# Patient Record
Sex: Male | Born: 1960 | Race: Black or African American | Hispanic: No | Marital: Single | Smoking: Never smoker
Health system: Southern US, Community
[De-identification: ages and names within clinical notes are randomized; demographics above are authoritative.]

## PROBLEM LIST (undated history)

## (undated) DIAGNOSIS — E119 Type 2 diabetes mellitus without complications: Secondary | ICD-10-CM

## (undated) DIAGNOSIS — F419 Anxiety disorder, unspecified: Secondary | ICD-10-CM

## (undated) DIAGNOSIS — D649 Anemia, unspecified: Secondary | ICD-10-CM

## (undated) DIAGNOSIS — M545 Low back pain, unspecified: Secondary | ICD-10-CM

## (undated) DIAGNOSIS — IMO0001 Reserved for inherently not codable concepts without codable children: Secondary | ICD-10-CM

## (undated) DIAGNOSIS — I10 Essential (primary) hypertension: Secondary | ICD-10-CM

## (undated) DIAGNOSIS — M1990 Unspecified osteoarthritis, unspecified site: Secondary | ICD-10-CM

## (undated) DIAGNOSIS — R51 Headache: Secondary | ICD-10-CM

## (undated) DIAGNOSIS — R591 Generalized enlarged lymph nodes: Secondary | ICD-10-CM

## (undated) DIAGNOSIS — G8929 Other chronic pain: Secondary | ICD-10-CM

## (undated) DIAGNOSIS — F1120 Opioid dependence, uncomplicated: Secondary | ICD-10-CM

## (undated) DIAGNOSIS — L409 Psoriasis, unspecified: Secondary | ICD-10-CM

## (undated) DIAGNOSIS — K219 Gastro-esophageal reflux disease without esophagitis: Secondary | ICD-10-CM

## (undated) DIAGNOSIS — F40240 Claustrophobia: Secondary | ICD-10-CM

## (undated) DIAGNOSIS — R058 Other specified cough: Secondary | ICD-10-CM

## (undated) DIAGNOSIS — G5602 Carpal tunnel syndrome, left upper limb: Secondary | ICD-10-CM

## (undated) DIAGNOSIS — R05 Cough: Secondary | ICD-10-CM

## (undated) DIAGNOSIS — L4050 Arthropathic psoriasis, unspecified: Secondary | ICD-10-CM

## (undated) DIAGNOSIS — R519 Headache, unspecified: Secondary | ICD-10-CM

## (undated) HISTORY — DX: Generalized enlarged lymph nodes: R59.1

## (undated) HISTORY — PX: CHOLECYSTECTOMY: SHX55

## (undated) HISTORY — PX: SHOULDER SURGERY: SHX246

## (undated) HISTORY — PX: JOINT REPLACEMENT: SHX530

---

## 1987-02-14 HISTORY — PX: LEG SURGERY: SHX1003

## 2011-02-14 HISTORY — PX: NOSE SURGERY: SHX723

## 2011-02-14 HISTORY — PX: DIAGNOSTIC LAPAROSCOPY: SUR761

## 2012-08-13 ENCOUNTER — Other Ambulatory Visit: Payer: Self-pay | Admitting: Orthopedic Surgery

## 2012-08-13 DIAGNOSIS — M79605 Pain in left leg: Secondary | ICD-10-CM

## 2012-08-19 ENCOUNTER — Ambulatory Visit
Admission: RE | Admit: 2012-08-19 | Discharge: 2012-08-19 | Disposition: A | Discharge status: Home / Self Care | Payer: Medicare Other | Source: Ambulatory Visit | Attending: Orthopedic Surgery | Admitting: Orthopedic Surgery

## 2012-08-19 DIAGNOSIS — M79605 Pain in left leg: Secondary | ICD-10-CM

## 2012-08-28 ENCOUNTER — Other Ambulatory Visit: Payer: Self-pay | Admitting: Orthopedic Surgery

## 2012-09-16 ENCOUNTER — Encounter (HOSPITAL_COMMUNITY): Payer: Self-pay

## 2012-09-20 NOTE — Pre-Procedure Instructions (Addendum)
Zedrick Springsteen  09/20/2012   Your procedure is scheduled on:  10/01/12  Report to [facility] at 530 AM.  Call this number if you have problems the morning of surgery: [phone]   Remember:   Do not eat food or drink liquids after midnight.   Take these medicines the morning of surgery with A SIP OF WATER: allopurinol, prilosec, zoloft   Do not wear jewelry, make-up or nail polish.  Do not wear lotions, powders, or perfumes. You may wear deodorant.  Do not shave 48 hours prior to surgery. Men may shave face and neck.  Do not bring valuables to the hospital.  [facility] is not responsible                   for any belongings or valuables.  Contacts, dentures or bridgework may not be worn into surgery.  Leave suitcase in the car. After surgery it may be brought to your room.  For patients admitted to the hospital, checkout time is 11:00 AM the day of  discharge.   Patients discharged the day of surgery will not be allowed to drive  home.  Name and phone number of your driver:  Special Instructions: Shower using CHG 2 nights before surgery and the night before surgery.  If you shower the day of surgery use CHG.  Use special wash - you have one bottle of CHG for all showers.  You should use approximately 1/3 of the bottle for each shower.   Please read over the following fact sheets that you were given: Pain Booklet, Coughing and Deep Breathing, Blood Transfusion Information, Total Joint Packet, MRSA Information and Surgical Site Infection Prevention

## 2012-09-23 ENCOUNTER — Encounter (HOSPITAL_COMMUNITY)
Admission: RE | Admit: 2012-09-23 | Discharge: 2012-09-23 | Disposition: A | Discharge status: Home / Self Care | Payer: Medicare Other | Source: Ambulatory Visit | Attending: Orthopedic Surgery | Admitting: Orthopedic Surgery

## 2012-09-23 ENCOUNTER — Encounter (HOSPITAL_COMMUNITY): Payer: Self-pay

## 2012-09-23 DIAGNOSIS — Z01810 Encounter for preprocedural cardiovascular examination: Secondary | ICD-10-CM | POA: Insufficient documentation

## 2012-09-23 DIAGNOSIS — Z01818 Encounter for other preprocedural examination: Secondary | ICD-10-CM | POA: Insufficient documentation

## 2012-09-23 DIAGNOSIS — Z01812 Encounter for preprocedural laboratory examination: Secondary | ICD-10-CM | POA: Insufficient documentation

## 2012-09-23 HISTORY — DX: Essential (primary) hypertension: I10

## 2012-09-23 HISTORY — DX: Unspecified osteoarthritis, unspecified site: M19.90

## 2012-09-23 HISTORY — DX: Gastro-esophageal reflux disease without esophagitis: K21.9

## 2012-09-23 HISTORY — DX: Anxiety disorder, unspecified: F41.9

## 2012-09-23 HISTORY — DX: Type 2 diabetes mellitus without complications: E11.9

## 2012-09-23 LAB — CBC
Hemoglobin: 15.1 g/dL (ref 13.0–17.0)
Platelets: 213 10*3/uL (ref 150–400)
RBC: 5.38 MIL/uL (ref 4.22–5.81)

## 2012-09-23 LAB — BASIC METABOLIC PANEL
GFR calc Af Amer: >90 mL/min (ref >90)
GFR calc non Af Amer: >90 mL/min (ref >90)
Potassium: 4.4 mEq/L (ref 3.5–5.1)
Sodium: 141 mEq/L (ref 135–145)

## 2012-09-23 LAB — URINALYSIS, ROUTINE W REFLEX MICROSCOPIC
Hgb urine dipstick: NEGATIVE
Ketones, ur: NEGATIVE mg/dL
Protein, ur: NEGATIVE mg/dL
Urobilinogen, UA: 1 mg/dL (ref 0.0–1.0)

## 2012-09-23 LAB — SURGICAL PCR SCREEN
MRSA, PCR: NEGATIVE
Staphylococcus aureus: NEGATIVE

## 2012-09-23 LAB — TYPE AND SCREEN
ABO/RH(D): B POS
Antibody Screen: NEGATIVE

## 2012-09-23 LAB — ABO/RH: ABO/RH(D): B POS

## 2012-09-23 NOTE — Progress Notes (Signed)
09/23/12 0830  OBSTRUCTIVE SLEEP APNEA  Have you ever been diagnosed with sleep apnea through a sleep study? No  Do you snore loudly (loud enough to be heard through closed doors)?  0  Do you often feel tired, fatigued, or sleepy during the daytime? 1  Has anyone observed you stop breathing during your sleep? 0  Do you have, or are you being treated for high blood pressure? 1  BMI more than 35 kg/m2? 1  Age over 54 years old? 1  Neck circumference greater than 40 cm/18 inches? 1 (20)  Gender: 1  Obstructive Sleep Apnea Score 6  Score 4 or greater  Results sent to PCP

## 2012-09-23 NOTE — Progress Notes (Signed)
req'd notes, ekg from pcp dr Velna Hatchet at [place]

## 2012-09-24 LAB — URINE CULTURE

## 2012-09-24 NOTE — Progress Notes (Addendum)
Anesthesia chart review:  Patient is a 52 year old male scheduled for right TKA on 10/01/12 by Phillip Rocha.  History includes morbid obesity (BMI 47), DM2, HTN (BP 138/100 at PAT with losartan added on 09/18/12), GERD, anxiety, osteoarthritis, psoriatic arthritis with history of Enbrel (per PCP notes), cholecystectomy, non-smoker.  OSA screening score was 6.  PCP is Phillip Rocha at [facility]) who saw patient on 09/18/12 and following visit with EKG felt patient to be acceptable medical risk for this procedure. Rheumatologist is Phillip Rocha. He recently moved from [place] to [facility].  EKG on 09/23/12 showed SR with occasional PACs, inferior infarct (age undetermined).  His EKG appears similar to the 09/18/12 surgical clearance EKG done at Phillip Rocha office.   CXR on 09/23/12 showed: Bibasilar scarring versus fibrosis. No superimposed acute process.  Preoperative labs noted. By PCP notes, his last A1C was 7.1 and metformin was increased to 1000MG BID on 09/18/12.  I reviewed above with anesthesiologist Phillip Rocha.  Although patient has no documented history of CAD, he has inferior Q waves on EKG and multiple CAD risk factors.  Preoperative cardiology evaluation recommended.  I have relayed this to Dr. Randel Rocha Phillip Rocha.  Phillip Rocha [facility] Short Stay Center/Anesthesiology Phone [phone] 09/24/2012 4:12 PM  Addendum: 09/30/13 11:11 AM Patient had a cardiology evaluation by Phillip Rocha [facility]) on 09/25/12.  EKG was reviewed.  BP was 144/96.  A nuclear stress test was ordered and done on 09/26/12.  Results showed inferior defect consistent with diaphragmatic attenuation, small fixed mild anterior defect likely soft tissue attenuation, no ischemia, post stress EF 50%.  Low risk scan.  Subsequently, patient was cleared for knee surgery.

## 2012-09-30 MED ORDER — CHLORHEXIDINE GLUCONATE 4 % EX LIQD: 60.0000 mL | Freq: Once | CUTANEOUS | Status: DC

## 2012-09-30 MED ORDER — CEFAZOLIN SODIUM-DEXTROSE 2-3 GM-% IV SOLR: 2.0000 g | INTRAVENOUS | Status: DC

## 2012-09-30 MED ORDER — DEXTROSE 5 % IV SOLN
3.0000 g | INTRAVENOUS | Status: AC
  Administered 2012-10-01: 3 g via INTRAVENOUS
  Administered 2012-10-01: 2 g via INTRAVENOUS
  Filled 2012-09-30: qty 3000
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
## 2012-10-01 ENCOUNTER — Inpatient Hospital Stay (HOSPITAL_COMMUNITY): Payer: Medicare Other | Admitting: Anesthesiology

## 2012-10-01 ENCOUNTER — Encounter (HOSPITAL_COMMUNITY)
Admission: RE | Disposition: A | Discharge status: Skilled Nursing Facility | Payer: Self-pay | Source: Ambulatory Visit | Attending: Orthopedic Surgery

## 2012-10-01 ENCOUNTER — Encounter (HOSPITAL_COMMUNITY): Payer: Self-pay | Admitting: Surgery

## 2012-10-01 ENCOUNTER — Encounter (HOSPITAL_COMMUNITY): Payer: Self-pay | Admitting: Vascular Surgery

## 2012-10-01 ENCOUNTER — Inpatient Hospital Stay (HOSPITAL_COMMUNITY)
Admission: RE | Admit: 2012-10-01 | Discharge: 2012-10-04 | DRG: 470 | Disposition: A | Discharge status: Skilled Nursing Facility | Payer: Medicare Other | Source: Ambulatory Visit | Attending: Orthopedic Surgery | Admitting: Orthopedic Surgery

## 2012-10-01 DIAGNOSIS — K219 Gastro-esophageal reflux disease without esophagitis: Secondary | ICD-10-CM | POA: Diagnosis present

## 2012-10-01 DIAGNOSIS — Z23 Encounter for immunization: Secondary | ICD-10-CM

## 2012-10-01 DIAGNOSIS — Z9089 Acquired absence of other organs: Secondary | ICD-10-CM

## 2012-10-01 DIAGNOSIS — F411 Generalized anxiety disorder: Secondary | ICD-10-CM | POA: Diagnosis present

## 2012-10-01 DIAGNOSIS — Z79899 Other long term (current) drug therapy: Secondary | ICD-10-CM

## 2012-10-01 DIAGNOSIS — I10 Essential (primary) hypertension: Secondary | ICD-10-CM | POA: Diagnosis present

## 2012-10-01 DIAGNOSIS — M1710 Unilateral primary osteoarthritis, unspecified knee: Principal | ICD-10-CM | POA: Diagnosis present

## 2012-10-01 DIAGNOSIS — M1711 Unilateral primary osteoarthritis, right knee: Secondary | ICD-10-CM

## 2012-10-01 DIAGNOSIS — E119 Type 2 diabetes mellitus without complications: Secondary | ICD-10-CM | POA: Diagnosis present

## 2012-10-01 HISTORY — PX: TOTAL KNEE ARTHROPLASTY: SHX125

## 2012-10-01 LAB — GLUCOSE, CAPILLARY: Glucose-Capillary: 144 mg/dL — ABNORMAL HIGH (ref 70–99)

## 2012-10-01 LAB — PROTIME-INR
INR: 0.86 (ref 0.00–1.49)
Prothrombin Time: 11.6 seconds (ref 11.6–15.2)

## 2012-10-01 SURGERY — ARTHROPLASTY, KNEE, TOTAL
Anesthesia: General | Site: Knee | Laterality: Right | Wound class: Clean

## 2012-10-01 MED ORDER — ACETAMINOPHEN 650 MG RE SUPP: 650.0000 mg | Freq: Four times a day (QID) | RECTAL | Status: DC | PRN

## 2012-10-01 MED ORDER — 0.9 % SODIUM CHLORIDE (POUR BTL) OPTIME
TOPICAL | Status: DC | PRN
  Administered 2012-10-01: 1000 mL

## 2012-10-01 MED ORDER — INSULIN ASPART 100 UNIT/ML ~~LOC~~ SOLN
0.0000 [IU] | Freq: Three times a day (TID) | SUBCUTANEOUS | Status: DC
  Administered 2012-10-01 – 2012-10-02 (×4): 2 [IU] via SUBCUTANEOUS
  Administered 2012-10-03: 3 [IU] via SUBCUTANEOUS
  Administered 2012-10-03 – 2012-10-04 (×4): 2 [IU] via SUBCUTANEOUS

## 2012-10-01 MED ORDER — POTASSIUM CHLORIDE IN NACL 20-0.9 MEQ/L-% IV SOLN
INTRAVENOUS | Status: DC
  Administered 2012-10-02: 02:00:00 via INTRAVENOUS
  Filled 2012-10-01 (×2): qty 1000

## 2012-10-01 MED ORDER — WARFARIN VIDEO
Freq: Once | Status: AC
  Administered 2012-10-02: 10:00:00

## 2012-10-01 MED ORDER — NEOSTIGMINE METHYLSULFATE 1 MG/ML IJ SOLN
INTRAMUSCULAR | Status: DC | PRN
  Administered 2012-10-01: 4 mg via INTRAVENOUS

## 2012-10-01 MED ORDER — CYCLOBENZAPRINE HCL 10 MG PO TABS
10.0000 mg | ORAL_TABLET | Freq: Every day | ORAL | Status: DC
  Administered 2012-10-01 – 2012-10-04 (×4): 10 mg via ORAL
  Filled 2012-10-01 (×6): qty 1

## 2012-10-01 MED ORDER — OXYCODONE HCL ER 15 MG PO T12A
15.0000 mg | EXTENDED_RELEASE_TABLET | Freq: Two times a day (BID) | ORAL | Status: DC
  Administered 2012-10-02 – 2012-10-04 (×5): 15 mg via ORAL
  Filled 2012-10-01 (×5): qty 1

## 2012-10-01 MED ORDER — COUMADIN BOOK
Freq: Once | Status: AC
  Administered 2012-10-01: 16:00:00
  Filled 2012-10-01: qty 1

## 2012-10-01 MED ORDER — MIDAZOLAM HCL 2 MG/2ML IJ SOLN: 0.5000 mg | Freq: Once | INTRAMUSCULAR | Status: DC | PRN

## 2012-10-01 MED ORDER — AMLODIPINE BESYLATE 10 MG PO TABS
10.0000 mg | ORAL_TABLET | Freq: Every day | ORAL | Status: DC
  Administered 2012-10-01 – 2012-10-04 (×4): 10 mg via ORAL
  Filled 2012-10-01 (×4): qty 1

## 2012-10-01 MED ORDER — PHENOL 1.4 % MT LIQD: 1.0000 | OROMUCOSAL | Status: DC | PRN

## 2012-10-01 MED ORDER — ONDANSETRON HCL 4 MG/2ML IJ SOLN: 4.0000 mg | Freq: Four times a day (QID) | INTRAMUSCULAR | Status: DC | PRN

## 2012-10-01 MED ORDER — MORPHINE SULFATE (PF) 1 MG/ML IV SOLN
INTRAVENOUS | Status: DC
  Administered 2012-10-01: 13:00:00 via INTRAVENOUS
  Administered 2012-10-01: 4.5 mg via INTRAVENOUS
  Administered 2012-10-02: 13 mg via INTRAVENOUS
  Filled 2012-10-01: qty 25

## 2012-10-01 MED ORDER — DIPHENHYDRAMINE HCL 50 MG/ML IJ SOLN
12.5000 mg | Freq: Four times a day (QID) | INTRAMUSCULAR | Status: DC | PRN
  Administered 2012-10-01 – 2012-10-03 (×4): 12.5 mg via INTRAVENOUS
  Filled 2012-10-01 (×4): qty 1

## 2012-10-01 MED ORDER — METFORMIN HCL 500 MG PO TABS
1000.0000 mg | ORAL_TABLET | Freq: Two times a day (BID) | ORAL | Status: DC
  Administered 2012-10-01 – 2012-10-04 (×7): 1000 mg via ORAL
  Filled 2012-10-01 (×8): qty 2

## 2012-10-01 MED ORDER — WARFARIN SODIUM 10 MG PO TABS
10.0000 mg | ORAL_TABLET | Freq: Once | ORAL | Status: AC
  Administered 2012-10-01: 10 mg via ORAL
  Filled 2012-10-01: qty 1

## 2012-10-01 MED ORDER — HYDROMORPHONE HCL PF 1 MG/ML IJ SOLN
0.2500 mg | INTRAMUSCULAR | Status: DC | PRN
  Administered 2012-10-01 (×4): 0.5 mg via INTRAVENOUS

## 2012-10-01 MED ORDER — OXYCODONE HCL 5 MG PO TABS
5.0000 mg | ORAL_TABLET | Freq: Once | ORAL | Status: AC | PRN
  Administered 2012-10-01: 5 mg via ORAL

## 2012-10-01 MED ORDER — PROMETHAZINE HCL 25 MG/ML IJ SOLN: 6.2500 mg | INTRAMUSCULAR | Status: DC | PRN

## 2012-10-01 MED ORDER — MIDAZOLAM HCL 5 MG/5ML IJ SOLN
INTRAMUSCULAR | Status: DC | PRN
  Administered 2012-10-01: 2 mg via INTRAVENOUS

## 2012-10-01 MED ORDER — ROCURONIUM BROMIDE 100 MG/10ML IV SOLN
INTRAVENOUS | Status: DC | PRN
  Administered 2012-10-01: 25 mg via INTRAVENOUS
  Administered 2012-10-01: 10 mg via INTRAVENOUS
  Administered 2012-10-01: 20 mg via INTRAVENOUS
  Administered 2012-10-01: 25 mg via INTRAVENOUS

## 2012-10-01 MED ORDER — LOSARTAN POTASSIUM 50 MG PO TABS
50.0000 mg | ORAL_TABLET | Freq: Every day | ORAL | Status: DC
  Administered 2012-10-01 – 2012-10-04 (×4): 50 mg via ORAL
  Filled 2012-10-01 (×4): qty 1

## 2012-10-01 MED ORDER — ALLOPURINOL 100 MG PO TABS
100.0000 mg | ORAL_TABLET | Freq: Two times a day (BID) | ORAL | Status: DC
  Administered 2012-10-01 – 2012-10-04 (×6): 100 mg via ORAL
  Filled 2012-10-01 (×7): qty 1

## 2012-10-01 MED ORDER — SERTRALINE HCL 25 MG PO TABS
25.0000 mg | ORAL_TABLET | Freq: Every day | ORAL | Status: DC
  Administered 2012-10-02 – 2012-10-04 (×3): 25 mg via ORAL
  Filled 2012-10-01 (×3): qty 1

## 2012-10-01 MED ORDER — METOCLOPRAMIDE HCL 10 MG PO TABS
5.0000 mg | ORAL_TABLET | Freq: Three times a day (TID) | ORAL | Status: DC | PRN
  Administered 2012-10-04: 10 mg via ORAL
  Filled 2012-10-01: qty 1

## 2012-10-01 MED ORDER — OXYCODONE HCL 5 MG PO TABS
ORAL_TABLET | ORAL | Status: AC
  Administered 2012-10-01: 5 mg
  Filled 2012-10-01: qty 1

## 2012-10-01 MED ORDER — DIPHENHYDRAMINE HCL 12.5 MG/5ML PO ELIX: 12.5000 mg | ORAL_SOLUTION | Freq: Four times a day (QID) | ORAL | Status: DC | PRN

## 2012-10-01 MED ORDER — SODIUM CHLORIDE 0.9 % IJ SOLN
INTRAMUSCULAR | Status: DC | PRN
  Administered 2012-10-01: 60 mL

## 2012-10-01 MED ORDER — PANTOPRAZOLE SODIUM 40 MG PO TBEC
40.0000 mg | DELAYED_RELEASE_TABLET | Freq: Every day | ORAL | Status: DC
  Administered 2012-10-02 – 2012-10-04 (×3): 40 mg via ORAL
  Filled 2012-10-01 (×3): qty 1

## 2012-10-01 MED ORDER — GLYCOPYRROLATE 0.2 MG/ML IJ SOLN
INTRAMUSCULAR | Status: DC | PRN
  Administered 2012-10-01: .8 mg via INTRAVENOUS

## 2012-10-01 MED ORDER — SUCCINYLCHOLINE CHLORIDE 20 MG/ML IJ SOLN
INTRAMUSCULAR | Status: DC | PRN
  Administered 2012-10-01: 120 mg via INTRAVENOUS

## 2012-10-01 MED ORDER — MORPHINE SULFATE (PF) 1 MG/ML IV SOLN
INTRAVENOUS | Status: AC
  Filled 2012-10-01: qty 25

## 2012-10-01 MED ORDER — ATORVASTATIN CALCIUM 40 MG PO TABS
40.0000 mg | ORAL_TABLET | Freq: Every day | ORAL | Status: DC
  Administered 2012-10-01 – 2012-10-04 (×4): 40 mg via ORAL
  Filled 2012-10-01 (×4): qty 1

## 2012-10-01 MED ORDER — SODIUM CHLORIDE 0.9 % IJ SOLN
INTRAMUSCULAR | Status: DC | PRN
  Administered 2012-10-01: 10:00:00

## 2012-10-01 MED ORDER — PHENYLEPHRINE HCL 10 MG/ML IJ SOLN
10.0000 mg | INTRAVENOUS | Status: DC | PRN
  Administered 2012-10-01: 10 ug/min via INTRAVENOUS

## 2012-10-01 MED ORDER — LACTATED RINGERS IV SOLN
INTRAVENOUS | Status: DC | PRN
  Administered 2012-10-01 (×3): via INTRAVENOUS

## 2012-10-01 MED ORDER — DOCUSATE SODIUM 100 MG PO CAPS
100.0000 mg | ORAL_CAPSULE | Freq: Two times a day (BID) | ORAL | Status: DC
  Administered 2012-10-01 – 2012-10-04 (×6): 100 mg via ORAL
  Filled 2012-10-01 (×7): qty 1

## 2012-10-01 MED ORDER — ONDANSETRON HCL 4 MG PO TABS: 4.0000 mg | ORAL_TABLET | Freq: Four times a day (QID) | ORAL | Status: DC | PRN

## 2012-10-01 MED ORDER — HYDROMORPHONE HCL PF 1 MG/ML IJ SOLN
INTRAMUSCULAR | Status: AC
  Filled 2012-10-01: qty 2

## 2012-10-01 MED ORDER — NALOXONE HCL 0.4 MG/ML IJ SOLN: 0.4000 mg | INTRAMUSCULAR | Status: DC | PRN

## 2012-10-01 MED ORDER — MEPERIDINE HCL 25 MG/ML IJ SOLN: 6.2500 mg | INTRAMUSCULAR | Status: DC | PRN

## 2012-10-01 MED ORDER — ACETAMINOPHEN 325 MG PO TABS
650.0000 mg | ORAL_TABLET | Freq: Four times a day (QID) | ORAL | Status: DC | PRN
  Administered 2012-10-03 – 2012-10-04 (×2): 650 mg via ORAL
  Filled 2012-10-01 (×2): qty 2

## 2012-10-01 MED ORDER — SODIUM CHLORIDE 0.9 % IR SOLN
Status: DC | PRN
  Administered 2012-10-01: 3000 mL

## 2012-10-01 MED ORDER — WARFARIN - PHARMACIST DOSING INPATIENT
Freq: Every day | Status: DC
  Administered 2012-10-04: 18:00:00

## 2012-10-01 MED ORDER — PROPOFOL 10 MG/ML IV BOLUS
INTRAVENOUS | Status: DC | PRN
  Administered 2012-10-01: 300 mg via INTRAVENOUS

## 2012-10-01 MED ORDER — SODIUM CHLORIDE 0.9 % IJ SOLN: 9.0000 mL | INTRAMUSCULAR | Status: DC | PRN

## 2012-10-01 MED ORDER — BUPIVACAINE LIPOSOME 1.3 % IJ SUSP
20.0000 mL | INTRAMUSCULAR | Status: DC
  Filled 2012-10-01: qty 20

## 2012-10-01 MED ORDER — MENTHOL 3 MG MT LOZG: 1.0000 | LOZENGE | OROMUCOSAL | Status: DC | PRN

## 2012-10-01 MED ORDER — CEFAZOLIN SODIUM-DEXTROSE 2-3 GM-% IV SOLR
INTRAVENOUS | Status: AC
  Filled 2012-10-01: qty 50

## 2012-10-01 MED ORDER — OXYCODONE HCL 5 MG/5ML PO SOLN: 5.0000 mg | Freq: Once | ORAL | Status: AC | PRN

## 2012-10-01 MED ORDER — PNEUMOCOCCAL VAC POLYVALENT 25 MCG/0.5ML IJ INJ
0.5000 mL | INJECTION | INTRAMUSCULAR | Status: AC
  Filled 2012-10-01: qty 0.5

## 2012-10-01 MED ORDER — OXYCODONE HCL 5 MG PO TABS
5.0000 mg | ORAL_TABLET | ORAL | Status: DC | PRN
  Administered 2012-10-01 – 2012-10-04 (×16): 10 mg via ORAL
  Filled 2012-10-01 (×18): qty 2

## 2012-10-01 MED ORDER — FENTANYL CITRATE 0.05 MG/ML IJ SOLN
INTRAMUSCULAR | Status: DC | PRN
  Administered 2012-10-01 (×3): 50 ug via INTRAVENOUS
  Administered 2012-10-01 (×2): 100 ug via INTRAVENOUS
  Administered 2012-10-01: 150 ug via INTRAVENOUS

## 2012-10-01 MED ORDER — CEFAZOLIN SODIUM-DEXTROSE 2-3 GM-% IV SOLR
2.0000 g | Freq: Four times a day (QID) | INTRAVENOUS | Status: AC
  Administered 2012-10-01 (×2): 2 g via INTRAVENOUS
  Filled 2012-10-01 (×2): qty 50

## 2012-10-01 MED ORDER — METOCLOPRAMIDE HCL 5 MG/ML IJ SOLN: 5.0000 mg | Freq: Three times a day (TID) | INTRAMUSCULAR | Status: DC | PRN

## 2012-10-01 MED ORDER — PHENYLEPHRINE HCL 10 MG/ML IJ SOLN
INTRAMUSCULAR | Status: DC | PRN
  Administered 2012-10-01: 80 ug via INTRAVENOUS

## 2012-10-01 SURGICAL SUPPLY — 81 items
BAG URINE DRAINAGE (UROLOGICAL SUPPLIES) ×2 IMPLANT
BANDAGE ELASTIC 4 VELCRO ST LF (GAUZE/BANDAGES/DRESSINGS) ×2 IMPLANT
BANDAGE ELASTIC 6 VELCRO ST LF (GAUZE/BANDAGES/DRESSINGS) ×2 IMPLANT
BANDAGE ESMARK 6X9 LF (GAUZE/BANDAGES/DRESSINGS) ×1 IMPLANT
BLADE SAG 18X100X1.27 (BLADE) ×2 IMPLANT
BLADE SAW SGTL 13.0X1.19X90.0M (BLADE) ×2 IMPLANT
BLADE SURG 10 STRL SS (BLADE) ×2 IMPLANT
BNDG COHESIVE 6X5 TAN STRL LF (GAUZE/BANDAGES/DRESSINGS) ×4 IMPLANT
BNDG ELASTIC 6X10 VLCR STRL LF (GAUZE/BANDAGES/DRESSINGS) ×6 IMPLANT
BNDG ESMARK 6X9 LF (GAUZE/BANDAGES/DRESSINGS) ×2
BOWL SMART MIX CTS (DISPOSABLE) ×4 IMPLANT
CATH COUDE FOLEY 2W 5CC 18FR (CATHETERS) ×2 IMPLANT
CATH COUDE FOLEY 5CC 14FR (CATHETERS) ×2 IMPLANT
CATH FOLEY 2WAY SLVR  5CC 14FR (CATHETERS) ×1
CATH FOLEY 2WAY SLVR 5CC 14FR (CATHETERS) ×1 IMPLANT
CEMENT BONE SIMPLEX SPEEDSET (Cement) ×6 IMPLANT
CLOTH BEACON ORANGE TIMEOUT ST (SAFETY) ×2 IMPLANT
COVER SURGICAL LIGHT HANDLE (MISCELLANEOUS) ×2 IMPLANT
CUFF TOURNIQUET SINGLE 34IN LL (TOURNIQUET CUFF) IMPLANT
CUFF TOURNIQUET SINGLE 44IN (TOURNIQUET CUFF) ×2 IMPLANT
DRAPE INCISE IOBAN 66X45 STRL (DRAPES) ×2 IMPLANT
DRAPE ORTHO SPLIT 77X108 STRL (DRAPES) ×3
DRAPE SURG ORHT 6 SPLT 77X108 (DRAPES) ×3 IMPLANT
DRAPE U-SHAPE 47X51 STRL (DRAPES) ×2 IMPLANT
DRAPE X-RAY CASS 24X20 (DRAPES) IMPLANT
DRSG PAD ABDOMINAL 8X10 ST (GAUZE/BANDAGES/DRESSINGS) ×2 IMPLANT
DURAPREP 26ML APPLICATOR (WOUND CARE) ×4 IMPLANT
ELECT REM PT RETURN 9FT ADLT (ELECTROSURGICAL) ×2
ELECTRODE REM PT RTRN 9FT ADLT (ELECTROSURGICAL) ×1 IMPLANT
EVACUATOR 1/8 PVC DRAIN (DRAIN) IMPLANT
FACESHIELD LNG OPTICON STERILE (SAFETY) ×2 IMPLANT
GAUZE XEROFORM 5X9 LF (GAUZE/BANDAGES/DRESSINGS) ×2 IMPLANT
GLOVE BIOGEL PI IND STRL 7.5 (GLOVE) ×1 IMPLANT
GLOVE BIOGEL PI IND STRL 8 (GLOVE) ×1 IMPLANT
GLOVE BIOGEL PI INDICATOR 7.5 (GLOVE) ×1
GLOVE BIOGEL PI INDICATOR 8 (GLOVE) ×1
GLOVE ECLIPSE 7.0 STRL STRAW (GLOVE) ×4 IMPLANT
GLOVE SURG ORTHO 8.0 STRL STRW (GLOVE) ×4 IMPLANT
GOWN PREVENTION PLUS LG XLONG (DISPOSABLE) IMPLANT
GOWN PREVENTION PLUS XLARGE (GOWN DISPOSABLE) ×2 IMPLANT
GOWN STRL NON-REIN LRG LVL3 (GOWN DISPOSABLE) ×6 IMPLANT
HANDPIECE INTERPULSE COAX TIP (DISPOSABLE) ×1
HOOD PEEL AWAY FACE SHEILD DIS (HOOD) ×6 IMPLANT
IMMOBILIZER KNEE 20 (SOFTGOODS)
IMMOBILIZER KNEE 20 THIGH 36 (SOFTGOODS) IMPLANT
IMMOBILIZER KNEE 22 UNIV (SOFTGOODS) ×2 IMPLANT
IMMOBILIZER KNEE 24 THIGH 36 (MISCELLANEOUS) IMPLANT
IMMOBILIZER KNEE 24 UNIV (MISCELLANEOUS)
KIT BASIN OR (CUSTOM PROCEDURE TRAY) ×2 IMPLANT
KIT ROOM TURNOVER OR (KITS) ×2 IMPLANT
KNEE LEVEL 1C ×2 IMPLANT
MANIFOLD NEPTUNE II (INSTRUMENTS) ×2 IMPLANT
MARKER SPHERE PSV REFLC THRD 5 (MARKER) IMPLANT
NEEDLE 18GX1X1/2 (RX/OR ONLY) (NEEDLE) IMPLANT
NEEDLE SPNL 18GX3.5 QUINCKE PK (NEEDLE) ×2 IMPLANT
NS IRRIG 1000ML POUR BTL (IV SOLUTION) ×6 IMPLANT
PACK TOTAL JOINT (CUSTOM PROCEDURE TRAY) ×2 IMPLANT
PAD ARMBOARD 7.5X6 YLW CONV (MISCELLANEOUS) ×4 IMPLANT
PAD CAST 4YDX4 CTTN HI CHSV (CAST SUPPLIES) ×1 IMPLANT
PADDING CAST COTTON 4X4 STRL (CAST SUPPLIES) ×1
PADDING CAST COTTON 6X4 STRL (CAST SUPPLIES) ×2 IMPLANT
PIN SCHANZ 4MM 130MM (PIN) IMPLANT
RUBBERBAND STERILE (MISCELLANEOUS) ×2 IMPLANT
SET HNDPC FAN SPRY TIP SCT (DISPOSABLE) ×1 IMPLANT
SPONGE GAUZE 4X4 12PLY (GAUZE/BANDAGES/DRESSINGS) ×2 IMPLANT
SPONGE LAP 18X18 X RAY DECT (DISPOSABLE) IMPLANT
STAPLER VISISTAT 35W (STAPLE) ×2 IMPLANT
STEM CEMENTED (Stem) ×2 IMPLANT
SUCTION FRAZIER TIP 10 FR DISP (SUCTIONS) ×2 IMPLANT
SUT ETHILON 3 0 PS 1 (SUTURE) ×4 IMPLANT
SUT VIC AB 0 CTB1 27 (SUTURE) ×8 IMPLANT
SUT VIC AB 1 CT1 27 (SUTURE) ×4
SUT VIC AB 1 CT1 27XBRD ANBCTR (SUTURE) ×4 IMPLANT
SUT VIC AB 2-0 CT1 27 (SUTURE) ×2
SUT VIC AB 2-0 CT1 TAPERPNT 27 (SUTURE) ×2 IMPLANT
SYR 30ML SLIP (SYRINGE) ×2 IMPLANT
SYR TB 1ML LUER SLIP (SYRINGE) ×2 IMPLANT
TOWEL OR 17X24 6PK STRL BLUE (TOWEL DISPOSABLE) ×2 IMPLANT
TOWEL OR 17X26 10 PK STRL BLUE (TOWEL DISPOSABLE) ×4 IMPLANT
TRAY FOLEY CATH 16FRSI W/METER (SET/KITS/TRAYS/PACK) ×2 IMPLANT
WATER STERILE IRR 1000ML POUR (IV SOLUTION) ×6 IMPLANT

## 2012-10-01 NOTE — Progress Notes (Signed)
Orthopedic Tech Progress Note Patient Details:  Phillip Rocha Mar 07, 1960 [id number]  CPM Right Knee CPM Right Knee: On Right Knee Flexion (Degrees): 60 Right Knee Extension (Degrees): 0   [name], [name] 10/01/2012, 12:59 PM

## 2012-10-01 NOTE — Brief Op Note (Signed)
10/01/2012  11:43 AM  PATIENT:  Phillip Rocha  52 y.o. male  PRE-OPERATIVE DIAGNOSIS:  Right Knee Osteoarthritis  POST-OPERATIVE DIAGNOSIS:  Right Knee Osteoarthritis  PROCEDURE:  Procedure(s): TOTAL KNEE ARTHROPLASTY  SURGEON:  Surgeon(s): Meredith Pel, MD  ASSISTANT: carla bethune rnfa  ANESTHESIA:   general  EBL: 250 ml    Total I/O In: 2000 [I.V.:2000] Out: -   BLOOD ADMINISTERED: none  DRAINS: none   LOCAL MEDICATIONS USED:  none  SPECIMEN:  No Specimen  COUNTS:  YES  TOURNIQUET:   Total Tourniquet Time Documented: Thigh (Right) - 121 minutes Total: Thigh (Right) - 121 minutes   DICTATION: .Other Dictation: Dictation Number [id number]  PLAN OF CARE: Admit to inpatient   PATIENT DISPOSITION:  PACU - hemodynamically stable
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
## 2012-10-01 NOTE — OR Nursing (Signed)
1140: foley insertion attempt per urologist using 79f coude with maginally difficult insertion all the way to catheter bifurcation=good flow of clear urine with scant clot to start; bulb inflated, bag attached, pt to pacu per routine.

## 2012-10-01 NOTE — Transfer of Care (Signed)
Immediate Anesthesia Transfer of Care Note  Patient: Phillip Rocha  Procedure(s) Performed: Procedure(s) with comments: TOTAL KNEE ARTHROPLASTY (Right) - right total knee arthroplasty  Patient Location: PACU  Anesthesia Type:General  Level of Consciousness: awake, alert  and oriented  Airway & Oxygen Therapy: Patient Spontanous Breathing and Patient connected to nasal cannula oxygen  Post-op Assessment: Report given to PACU RN and Post -op Vital signs reviewed and stable  Post vital signs: Reviewed and stable  Complications: No apparent anesthesia complications

## 2012-10-01 NOTE — Anesthesia Preprocedure Evaluation (Addendum)
Anesthesia Evaluation  Patient identified by MRN, date of birth, ID band Patient awake    Reviewed: Allergy & Precautions, H&P , NPO status , Patient's Chart, lab work & pertinent test results  History of Anesthesia Complications Negative for: history of anesthetic complications  Airway Mallampati: I TM Distance: >3 FB Neck ROM: Full    Dental  (+) Teeth Intact and Dental Advisory Given   Pulmonary neg pulmonary ROS,  breath sounds clear to auscultation  Pulmonary exam normal       Cardiovascular hypertension, Pt. on medications Rhythm:Regular Rate:Normal  Stress test 8/14: no ischemia, normal perfusion, EF 50%   Neuro/Psych negative neurological ROS  negative psych ROS   GI/Hepatic Neg liver ROS, GERD-  Medicated and Controlled,  Endo/Other  diabetes (glu 91), Type 2, Oral Hypoglycemic AgentsMorbid obesity  Renal/GU negative Renal ROS     Musculoskeletal   Abdominal (+) + obese,   Peds  Hematology   Anesthesia Other Findings   Reproductive/Obstetrics                          Anesthesia Physical Anesthesia Plan  ASA: III  Anesthesia Plan: General   Post-op Pain Management:    Induction: Intravenous  Airway Management Planned: Oral ETT  Additional Equipment:   Intra-op Plan:   Post-operative Plan: Extubation in OR  Informed Consent: I have reviewed the patients History and Physical, chart, labs and discussed the procedure including the risks, benefits and alternatives for the proposed anesthesia with the patient or authorized representative who has indicated his/her understanding and acceptance.     Plan Discussed with: CRNA and Surgeon  Anesthesia Plan Comments: (Plan routine monitors, GETA)        Anesthesia Quick Evaluation

## 2012-10-01 NOTE — Progress Notes (Signed)
ANTICOAGULATION CONSULT NOTE - Initial Consult  Pharmacy Consult for Coumadin Indication: VTE prophylaxis  No Known Allergies  Patient Measurements:   Heparin Dosing Weight:   Vital Signs: Temp: 97.5 F (36.4 C) (08/19 1500) Temp src: Oral (08/19 0558) BP: 104/87 mmHg (08/19 1500) Pulse Rate: 90 (08/19 1500)  Labs:  Recent Labs  10/01/12 0616  APTT 27  LABPROT 11.6  INR 0.86    CrCl is unknown because there is no height on file for the current visit.   Medical History: Past Medical History  Diagnosis Date  . Hypertension   . Anxiety   . Shortness of breath   . Diabetes mellitus without complication   . GERD (gastroesophageal reflux disease)   . Arthritis     Medications:  Scheduled:  . allopurinol  100 mg Oral BID  . amLODipine  10 mg Oral Daily  . atorvastatin  40 mg Oral q1800  .  ceFAZolin (ANCEF) IV  2 g Intravenous Q6H  . coumadin book   Does not apply Once  . cyclobenzaprine  10 mg Oral Daily  . docusate sodium  100 mg Oral BID  . HYDROmorphone      . insulin aspart  0-15 Units Subcutaneous TID WC  . losartan  50 mg Oral Daily  . metFORMIN  1,000 mg Oral BID WC  . morphine   Intravenous Q4H  . morphine      . [START ON 10/02/2012] OxyCODONE  15 mg Oral Q12H  . [START ON 10/02/2012] pantoprazole  40 mg Oral Daily  . [START ON 10/02/2012] pneumococcal 23 valent vaccine  0.5 mL Intramuscular Tomorrow-1000  . [START ON 10/02/2012] sertraline  25 mg Oral Daily  . warfarin  10 mg Oral ONCE-1800  . [START ON 10/02/2012] warfarin   Does not apply Once  . Warfarin - Pharmacist Dosing Inpatient   Does not apply q1800    Assessment:  52 yr old male was admitted for a rt total knee replacement. Now to start on coumadin post op. INR is 0.86.  Goal of Therapy:  INR 2-3    Plan:  Will give 10 mg Coumadin today. Ordered daily INR Ordered booklet and video for pt.  Minta Balsam 10/01/2012,4:01 PM

## 2012-10-01 NOTE — OR Nursing (Signed)
Foley catheter placement attempt per md request: standard sterile technique employed to place or-standard 34f foley, routine insertion up to point where further advancement was abruptly halted, catheter removed, minimal blood visible at tip of catheter. Second attempt with 149fcoude catheter per md request: same result, avoiding extreme insertion pressure, catheter removed, more noticeable amount of blood at catheter tip. Catheter placement deferred until after surgery per md. Urology consult with Dr. MaTammi Klippelalled by Dr. DeMarlou SaInsertion deferred until later.

## 2012-10-01 NOTE — Anesthesia Procedure Notes (Signed)
Procedure Name: Intubation Date/Time: 10/01/2012 7:41 AM Performed by: Eligha Bridegroom Pre-anesthesia Checklist: Patient identified, Timeout performed, Emergency Drugs available, Suction available and Patient being monitored Patient Re-evaluated:Patient Re-evaluated prior to inductionOxygen Delivery Method: Circle system utilized Preoxygenation: Pre-oxygenation with 100% oxygen Intubation Type: IV induction Ventilation: Mask ventilation without difficulty and Oral airway inserted - appropriate to patient size Laryngoscope Size: Mac and 4 Grade View: Grade II Tube type: Oral Tube size: 8.5 mm Number of attempts: 1 Airway Equipment and Method: Stylet Placement Confirmation: ETT inserted through vocal cords under direct vision,  breath sounds checked- equal and bilateral and positive ETCO2 Secured at: 23 cm Tube secured with: Tape Dental Injury: Teeth and Oropharynx as per pre-operative assessment

## 2012-10-01 NOTE — Progress Notes (Signed)
UR COMPLETED.

## 2012-10-01 NOTE — Interval H&P Note (Signed)
History and Physical Interval Note:  10/01/2012 7:22 AM  Phillip Rocha  has presented today for surgery, with the diagnosis of Right Knee Osteoarthritis  The various methods of treatment have been discussed with the patient and family. After consideration of risks, benefits and other options for treatment, the patient has consented to  Procedure(s): TOTAL KNEE ARTHROPLASTY (Right) as a surgical intervention .  The patient's history has been reviewed, patient examined, no change in status, stable for surgery.  I have reviewed the patient's chart and labs.  Questions were answered to the patient's satisfaction.     Donat Humble SCOTT

## 2012-10-01 NOTE — Plan of Care (Signed)
Problem: Consults Goal: Diagnosis- Total Joint Replacement Primary Total Knee Right

## 2012-10-01 NOTE — Anesthesia Postprocedure Evaluation (Signed)
  Anesthesia Post-op Note  Patient: Phillip Rocha  Procedure(s) Performed: Procedure(s) with comments: TOTAL KNEE ARTHROPLASTY (Right) - right total knee arthroplasty  Patient Location: PACU  Anesthesia Type:General  Level of Consciousness: awake, alert , oriented and patient cooperative  Airway and Oxygen Therapy: Patient Spontanous Breathing and Patient connected to nasal cannula oxygen  Post-op Pain: mild  Post-op Assessment: Post-op Vital signs reviewed, Patient's Cardiovascular Status Stable, Respiratory Function Stable, Patent Airway, No signs of Nausea or vomiting and Pain level controlled  Post-op Vital Signs: Reviewed and stable  Complications: No apparent anesthesia complications

## 2012-10-01 NOTE — Preoperative (Signed)
Beta Blockers   Reason not to administer Beta Blockers:Not Applicable

## 2012-10-02 ENCOUNTER — Encounter (HOSPITAL_COMMUNITY): Payer: Self-pay | Admitting: General Practice

## 2012-10-02 LAB — PROTIME-INR
INR: 1.09 (ref 0.00–1.49)
Prothrombin Time: 13.9 seconds (ref 11.6–15.2)

## 2012-10-02 LAB — BASIC METABOLIC PANEL
Chloride: 100 mEq/L (ref 96–112)
GFR calc Af Amer: 89 mL/min — ABNORMAL LOW (ref >90)
GFR calc non Af Amer: 77 mL/min — ABNORMAL LOW (ref >90)
Glucose, Bld: 172 mg/dL — ABNORMAL HIGH (ref 70–99)
Potassium: 4.2 mEq/L (ref 3.5–5.1)
Sodium: 137 mEq/L (ref 135–145)

## 2012-10-02 LAB — CBC
HCT: 37 % — ABNORMAL LOW (ref 39.0–52.0)
Hemoglobin: 12.4 g/dL — ABNORMAL LOW (ref 13.0–17.0)
MCHC: 33.5 g/dL (ref 30.0–36.0)
RDW: 14.7 % (ref 11.5–15.5)
WBC: 18 10*3/uL — ABNORMAL HIGH (ref 4.0–10.5)

## 2012-10-02 LAB — GLUCOSE, CAPILLARY
Glucose-Capillary: 150 mg/dL — ABNORMAL HIGH (ref 70–99)
Glucose-Capillary: 135 mg/dL — ABNORMAL HIGH (ref 70–99)

## 2012-10-02 MED ORDER — HYDROXYZINE HCL 10 MG PO TABS
10.0000 mg | ORAL_TABLET | Freq: Four times a day (QID) | ORAL | Status: DC | PRN
  Administered 2012-10-02 – 2012-10-04 (×7): 10 mg via ORAL
  Filled 2012-10-02 (×7): qty 1

## 2012-10-02 MED ORDER — HYDROMORPHONE HCL PF 1 MG/ML IJ SOLN
INTRAMUSCULAR | Status: AC
  Administered 2012-10-02: 2 mg
  Filled 2012-10-02: qty 2

## 2012-10-02 MED ORDER — WARFARIN SODIUM 10 MG PO TABS
10.0000 mg | ORAL_TABLET | Freq: Once | ORAL | Status: AC
  Administered 2012-10-02: 10 mg via ORAL
  Filled 2012-10-02: qty 1

## 2012-10-02 MED ORDER — HYDROMORPHONE HCL PF 1 MG/ML IJ SOLN
2.0000 mg | INTRAMUSCULAR | Status: DC | PRN
  Administered 2012-10-02 (×4): 2 mg via INTRAVENOUS
  Filled 2012-10-02 (×3): qty 2

## 2012-10-02 MED ORDER — HYDROCORTISONE 0.5 % EX CREA
TOPICAL_CREAM | Freq: Two times a day (BID) | CUTANEOUS | Status: DC | PRN
  Administered 2012-10-02 (×2): via TOPICAL
  Filled 2012-10-02 (×2): qty 28.35

## 2012-10-02 NOTE — Progress Notes (Signed)
Subjective: Pt stable - having pain   Objective: Vital signs in last 24 hours: Temp:  [97.5 F (36.4 C)-98.2 F (36.8 C)] 98.2 F (36.8 C) (08/20 0559) Pulse Rate:  [71-116] 115 (08/20 0559) Resp:  [7-20] 16 (08/20 0559) BP: (94-159)/(50-89) 116/68 mmHg (08/20 0559) SpO2:  [89 %-99 %] 96 % (08/20 0559)  Intake/Output from previous day: 08/19 0701 - 08/20 0700 In: 2000 [I.V.:2000] Out: 825 [Urine:825] Intake/Output this shift:    Exam:  Sensation intact distally Intact pulses distally Compartment soft  Labs:  Recent Labs  10/02/12 0625  HGB 12.4*    Recent Labs  10/02/12 0625  WBC 18.0*  RBC 4.47  HCT 37.0*  PLT 220   No results found for this basename: NA, K, CL, CO2, BUN, CREATININE, GLUCOSE, CALCIUM,  in the last 72 hours  Recent Labs  10/01/12 0616 10/02/12 0625  INR 0.86 1.09    Assessment/Plan: Pt doing well - having some post op pain - will continue oxy er and ir with dilaudid for breakthrough - mobilize with PT   Carrie Usery SCOTT 10/02/2012, 7:46 AM

## 2012-10-02 NOTE — Progress Notes (Signed)
Patient unable to produce void. IN and out cath performed. Urine return was 333m. Will now monitor for spontaneous void.

## 2012-10-02 NOTE — Progress Notes (Signed)
ANTICOAGULATION CONSULT NOTE - Follow Up Consult  Pharmacy Consult for Coumadin Indication: VTE prophylaxis s/p R TKR  No Known Allergies  Patient Measurements:    Vital Signs: Temp: 98.2 F (36.8 C) (08/20 0559) BP: 116/68 mmHg (08/20 0559) Pulse Rate: 115 (08/20 0559)  Labs:  Recent Labs  10/01/12 0616 10/02/12 0625  HGB  --  12.4*  HCT  --  37.0*  PLT  --  220  APTT 27  --   LABPROT 11.6 13.9  INR 0.86 1.09  CREATININE  --  1.07    CrCl is unknown because there is no height on file for the current visit.   Medications:  Scheduled:  . allopurinol  100 mg Oral BID  . amLODipine  10 mg Oral Daily  . atorvastatin  40 mg Oral q1800  . cyclobenzaprine  10 mg Oral Daily  . docusate sodium  100 mg Oral BID  . insulin aspart  0-15 Units Subcutaneous TID WC  . losartan  50 mg Oral Daily  . metFORMIN  1,000 mg Oral BID WC  . OxyCODONE  15 mg Oral Q12H  . pantoprazole  40 mg Oral Daily  . pneumococcal 23 valent vaccine  0.5 mL Intramuscular Tomorrow-1000  . sertraline  25 mg Oral Daily  . warfarin   Does not apply Once  . Warfarin - Pharmacist Dosing Inpatient   Does not apply q1800    Assessment: 52 yo M s/p R TKR 10/01/12 on Coumadin for post-op VTE prophylaxis.  INR has responded slightly to first Coumadin dose.  Will repeat.  Goal of Therapy:  INR 2-3   Plan:  Coumadin 10 mg PO x 1 tonight. Continue daily INR. Coumadin education.  [employer], Pharm.D., BCPS Clinical Pharmacist Pager [phone] 10/02/2012 10:11 AM

## 2012-10-02 NOTE — Progress Notes (Signed)
Patient unable to spontaneously void after foley discontinued at 0950. Bladder scan performed and 553m is in the bladder. Patient educated on the importance of voiding,but at this time is refusing to be in and out catheterized per protocol. Patient is agreeable to attempting to void in another fifteen minutes, and if he is unable to void at that time, we will perform an in and out catheretization.

## 2012-10-02 NOTE — Evaluation (Signed)
Physical Therapy Evaluation Patient Details Name: Phillip Rocha MRN: [id number] DOB: 07/12/1960 Today's Date: 10/02/2012 Time: 9371-6967 PT Time Calculation (min): 37 min  PT Assessment / Plan / Recommendation History of Present Illness  s/p RTKA; WBAT, KI  Clinical Impression  Pt is s/p TKA resulting in the deficits listed below (see PT Problem List).  Pt will benefit from skilled PT to increase their independence and safety with mobility to allow discharge to the venue listed below.   Lives with his cousin, and must be modified independent to dc home     PT Assessment  Patient needs continued PT services    Follow Up Recommendations  SNF;Supervision/Assistance - 24 hour    Does the patient have the potential to tolerate intense rehabilitation      Barriers to Discharge        Equipment Recommendations  Rolling walker with 5" wheels;3in1 (PT)    Recommendations for Other Services     Frequency 7X/week    Precautions / Restrictions Precautions Precautions: Knee Required Braces or Orthoses: Knee Immobilizer - Right Restrictions RLE Weight Bearing: Weight bearing as tolerated   Pertinent Vitals/Pain 8-9/10 R knee, but pt willing to participate; RN notified      Mobility  Bed Mobility Bed Mobility: Supine to Sit;Sitting - Scoot to Edge of Bed Supine to Sit: 4: Min assist;With rails;HOB elevated Sitting - Scoot to Edge of Bed: 4: Min assist;With rail Details for Bed Mobility Assistance: Cues for technique; Overall not needing much assist, but use of rails and with uncoordinated motion Transfers Transfers: Sit to Stand;Stand to Sit Sit to Stand: 3: Mod assist;4: Min assist;From bed;From chair/3-in-1;With armrests Stand to Sit: 4: Min assist;To chair/3-in-1;With armrests Details for Transfer Assistance: Cues for technique, safety, hand placement; Initially requiring mod asist to translate weight over feet with sit to satnd from bed; Needing less assist from  chair Ambulation/Gait Ambulation/Gait Assistance: 1: +2 Total assist (for safety) Ambulation/Gait: Patient Percentage: 70% Ambulation Distance (Feet): 8 Feet (x2) Assistive device: Rolling walker Ambulation/Gait Assistance Details: Cues for gait sequence and to fully extend hips for upright posture Gait Pattern: Step-to pattern    Exercises Total Joint Exercises Quad Sets: AROM;Right;10 reps Heel Slides: AAROM;Right;5 reps Straight Leg Raises: AAROM;Right;5 reps   PT Diagnosis: Difficulty walking;Acute pain  PT Problem List: Decreased strength;Decreased range of motion;Decreased activity tolerance;Decreased balance;Decreased mobility;Decreased knowledge of use of DME;Decreased knowledge of precautions;Pain PT Treatment Interventions: DME instruction;Gait training;Stair training;Functional mobility training;Therapeutic activities;Therapeutic exercise;Patient/family education     PT Goals(Current goals can be found in the care plan section) Acute Rehab PT Goals Patient Stated Goal: back to normal PT Goal Formulation: With patient Time For Goal Achievement: 10/09/12 Potential to Achieve Goals: Good  Visit Information  Last PT Received On: 10/02/12 Assistance Needed: +2 History of Present Illness: s/p RTKA; WBAT, KI       Prior [place] expects to be discharged to:: Skilled nursing facility Living Arrangements: Other relatives Prior Function Level of Independence: Independent 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(?); Fullback Communication Communication: No difficulties    Cognition  Cognition Arousal/Alertness: Awake/alert Behavior During Therapy: WFL for tasks assessed/performed Overall Cognitive Status: Within Functional Limits for tasks assessed    Extremity/Trunk Assessment Upper Extremity Assessment Upper Extremity Assessment: Overall WFL for tasks assessed Lower Extremity Assessment Lower Extremity  Assessment: RLE deficits/detail RLE Deficits / Details: Quad activation present, though weak; knee flexion limited by pain RLE: Unable to fully assess due to pain  Balance    End of Session PT - End of Session Equipment Utilized During Treatment: Gait belt;Right knee immobilizer Activity Tolerance: Patient tolerated treatment well Patient left: in chair;with call bell/phone within reach Nurse Communication: Mobility status  GP     Roney Marion [facility] [place], [place]  10/02/2012, 3:09 PM

## 2012-10-02 NOTE — Progress Notes (Signed)
Physical Therapy Note  Making functional gains, even from this morning's session, with incr amb distance; Will need to focus more on therex next session to obtain good quad control  Pain 9/10 R knee, but pt was willing to work through it; Therapist, sports notified, and reported she can brin gpain meds soon   10/02/12 1600  PT Visit Information  Last PT Received On 10/02/12  Assistance Needed +1  History of Present Illness s/p RTKA; WBAT, KI  PT Time Calculation  PT Start Time 1422  PT Stop Time 1440  PT Time Calculation (min) 18 min  Subjective Data  Subjective Wants to wait to get back to bed until hypoallergenic linen arrives  Patient Stated Goal back to normal  Precautions  Precautions Knee  Required Braces or Orthoses Knee Immobilizer - Right  Restrictions  RLE Weight Bearing WBAT  Cognition  Arousal/Alertness Awake/alert  Behavior During Therapy WFL for tasks assessed/performed  Overall Cognitive Status Within Functional Limits for tasks assessed  Transfers  Transfers Sit to Stand;Stand to Sit  Sit to Stand 4: Min assist;From chair/3-in-1;With armrests  Stand to Sit 4: Min assist;To chair/3-in-1;With armrests  Details for Transfer Assistance Cues for technique, safety, hand placement  Ambulation/Gait  Ambulation/Gait Assistance 4: Min assist;4: Min guard (for safety)  Ambulation Distance (Feet) 35 Feet  Assistive device Rolling walker  Ambulation/Gait Assistance Details Continued cues for upright posture as pt tends to have bil hips flexed  Gait Pattern Step-to pattern  Exercises  Exercises Total Joint  Total Joint Exercises  Quad Sets AROM;Right;5 reps  Straight Leg Raises AAROM;Right;5 reps  PT - End of Session  Equipment Utilized During Treatment Gait belt;Right knee immobilizer  Activity Tolerance Patient tolerated treatment well  Patient left in chair;with call bell/phone within reach  Nurse Communication Mobility status  PT - Assessment/Plan  PT Plan Current plan  remains appropriate  PT Frequency 7X/week  Follow Up Recommendations SNF;Supervision/Assistance - 24 hour  PT equipment Rolling walker with 5" wheels;3in1 (PT)  PT Goal Progression  Progress towards PT goals Progressing toward goals  Acute Rehab PT Goals  PT Goal Formulation With patient  Time For Goal Achievement 10/09/12  Potential to Achieve Goals Good  PT General Charges  $$ ACUTE PT VISIT 1 Procedure  PT Treatments  $Gait Training 8-22 mins   [place], [place]

## 2012-10-03 LAB — GLUCOSE, CAPILLARY
Glucose-Capillary: 139 mg/dL — ABNORMAL HIGH (ref 70–99)
Glucose-Capillary: 121 mg/dL — ABNORMAL HIGH (ref 70–99)
Glucose-Capillary: 149 mg/dL — ABNORMAL HIGH (ref 70–99)

## 2012-10-03 LAB — CBC
HCT: 32.7 % — ABNORMAL LOW (ref 39.0–52.0)
Hemoglobin: 10.5 g/dL — ABNORMAL LOW (ref 13.0–17.0)
MCH: 26.5 pg (ref 26.0–34.0)
MCHC: 32.1 g/dL (ref 30.0–36.0)
MCV: 82.6 fL (ref 78.0–100.0)
RBC: 3.96 MIL/uL — ABNORMAL LOW (ref 4.22–5.81)

## 2012-10-03 MED ORDER — WARFARIN SODIUM 7.5 MG PO TABS
7.5000 mg | ORAL_TABLET | Freq: Once | ORAL | Status: AC
  Administered 2012-10-03: 7.5 mg via ORAL
  Filled 2012-10-03: qty 1

## 2012-10-03 MED ORDER — WARFARIN SODIUM 5 MG PO TABS: 5.0000 mg | ORAL_TABLET | Freq: Every day | ORAL | Status: DC

## 2012-10-03 MED ORDER — PNEUMOCOCCAL VAC POLYVALENT 25 MCG/0.5ML IJ INJ
0.5000 mL | INJECTION | Freq: Once | INTRAMUSCULAR | Status: AC
  Administered 2012-10-03: 0.5 mL via INTRAMUSCULAR

## 2012-10-03 MED ORDER — OXYCODONE HCL ER 15 MG PO T12A: 15.0000 mg | EXTENDED_RELEASE_TABLET | Freq: Two times a day (BID) | ORAL | Status: DC

## 2012-10-03 MED ORDER — OXYCODONE HCL 5 MG PO TABS: 5.0000 mg | ORAL_TABLET | ORAL | Status: DC | PRN

## 2012-10-03 NOTE — Evaluation (Signed)
Occupational Therapy Evaluation Patient Details Name: Phillip Rocha MRN: [id number] DOB: Jul 04, 1960 Today's Date: 10/03/2012 Time: 1340-1400 OT Time Calculation (min): 20 min  OT Assessment / Plan / Recommendation History of present illness s/p RTKA; WBAT, KI   Clinical Impression   Pt presents with decreased safety awareness, fall risk attempting to use bathroom without assist.  Limited mobility secondary to dizziness.  Pt requires +2 for safety with mobility.  Dependent in LB ADL.  Plan is for SNF rehab.  Will defer further ADL training to SNF.    OT Assessment  All further OT needs can be met in the next venue of care    Follow Up Recommendations  SNF    Barriers to Discharge      Equipment Recommendations  3 in 1 bedside comode    Recommendations for Other Services    Frequency       Precautions / Restrictions Precautions Precautions: Knee;Fall Required Braces or Orthoses: Knee Immobilizer - Right Restrictions Weight Bearing Restrictions: Yes RLE Weight Bearing: Weight bearing as tolerated   Pertinent Vitals/Pain R knee, premedicated, repositioned    ADL  Eating/Feeding: Independent Where Assessed - Eating/Feeding: Chair Grooming: Wash/dry hands;Set up Where Assessed - Grooming: Unsupported sitting Upper Body Bathing: Minimal assistance Where Assessed - Upper Body Bathing: Unsupported sitting Lower Body Bathing: +1 Total assistance Where Assessed - Lower Body Bathing: Unsupported sitting;Supported sit to stand Upper Body Dressing: Set up Where Assessed - Upper Body Dressing: Unsupported sitting Lower Body Dressing: +1 Total assistance Where Assessed - Lower Body Dressing: Unsupported sitting;Supported sit to stand Toilet Transfer: Minimal assistance Toilet Transfer Method: Sit to stand Toilet Transfer Equipment: Comfort height toilet;Grab bars Equipment Used: Rolling walker Transfers/Ambulation Related to ADLs: min assist with RW with chair close by due to  dizziness ADL Comments: Began education on AE for LB ADL.  Pt typically put his leg up on the bed while sitting at the edge to donn socks.    OT Diagnosis: Generalized weakness;Acute pain  OT Problem List: Decreased strength;Decreased activity tolerance;Impaired balance (sitting and/or standing);Decreased knowledge of use of DME or AE;Obesity;Pain;Decreased safety awareness OT Treatment Interventions:     OT Goals(Current goals can be found in the care plan section) Acute Rehab OT Goals Patient Stated Goal: back to normal  Visit Information  Last OT Received On: 10/03/12 Assistance Needed: +2 (for safety) PT/OT Co-Evaluation/Treatment: Yes History of Present Illness: s/p RTKA; WBAT, KI       Prior [place] expects to be discharged to:: Skilled nursing facility Dominant Hand: Right         Vision/Perception Vision - History Patient Visual Report: No change from baseline   Cognition  Cognition Arousal/Alertness: Awake/alert Behavior During Therapy: WFL for tasks assessed/performed Overall Cognitive Status: Within Functional Limits for tasks assessed    Extremity/Trunk Assessment Upper Extremity Assessment Upper Extremity Assessment: Overall WFL for tasks assessed Lower Extremity Assessment Lower Extremity Assessment: Defer to PT evaluation     Mobility Bed Mobility Bed Mobility: Not assessed Transfers Stand to Sit: 4: Min assist;To chair/3-in-1;With armrests;Other (comment) (second person to bring recliner due to pt. dizziness) Details for Transfer Assistance: Cues for technique, safety, hand placement     Exercise    Balance     End of Session OT - End of Session Activity Tolerance: Treatment limited secondary to medical complications (Comment);Patient limited by fatigue (dizziness) Patient left: in chair;with call bell/phone within reach Nurse Communication:  (pt safety)  GO  Malka So 10/03/2012, 2:54  PM [phone]

## 2012-10-03 NOTE — Progress Notes (Signed)
Patient voided 200 ml of urine spontaneously around 0030.  Follow-up PVR was 14 ml.  Patient encouraged to continue drinking fluids, will continue to monitor.

## 2012-10-03 NOTE — Discharge Summary (Signed)
Physician Discharge Summary  Patient ID: Phillip Rocha MRN: [id number] DOB/AGE: 1960-05-10 52 y.o.  Admit date: 10/01/2012 Discharge date: 10/04/2012  Admission Diagnoses:  Right knee arthritis  Discharge Diagnoses:  Same  Surgeries: Procedure(s): TOTAL KNEE ARTHROPLASTY on 10/01/2012   Consultants:    Discharged Condition: Stable  Hospital Course: Phillip Rocha is an 52 y.o. male who was admitted 10/01/2012 with a chief complaint of right knee pain, and found to have a diagnosis of right knee arthritis.  They were brought to the operating room on 10/01/2012 and underwent the above named procedures.  Incision ok POD 2 - mobilizing with PT well - will benefit for 1 - 2 weeks SNF - coumadin for dvt prophylaxis.  Antibiotics given:  Anti-infectives   Start     Dose/Rate Route Frequency Ordered Stop   10/01/12 1600  ceFAZolin (ANCEF) IVPB 2 g/50 mL premix     2 g 100 mL/hr over 30 Minutes Intravenous Every 6 hours 10/01/12 1509 10/01/12 2216   10/01/12 0600  ceFAZolin (ANCEF) IVPB 2 g/50 mL premix  Status:  Discontinued     2 g 100 mL/hr over 30 Minutes Intravenous On call to O.R. 09/30/12 1421 09/30/12 1422   10/01/12 0600  ceFAZolin (ANCEF) 3 g in dextrose 5 % 50 mL IVPB     3 g 160 mL/hr over 30 Minutes Intravenous On call to O.R. 09/30/12 1422 10/01/12 1135    .  Recent vital signs:  Filed Vitals:   10/03/12 1200  BP:   Pulse:   Temp:   Resp: 18    Recent laboratory studies:  Results for orders placed during the hospital encounter of 10/01/12  PROTIME-INR      Result Value Range   Prothrombin Time 11.6  11.6 - 15.2 seconds   INR 0.86  0.00 - 1.49  APTT      Result Value Range   aPTT 27  24 - 37 seconds  GLUCOSE, CAPILLARY      Result Value Range   Glucose-Capillary 116 (*) 70 - 99 mg/dL   Comment 1 Notify RN    GLUCOSE, CAPILLARY      Result Value Range   Glucose-Capillary 144 (*) 70 - 99 mg/dL   Comment 1 Notify RN    GLUCOSE, CAPILLARY      Result  Value Range   Glucose-Capillary 134 (*) 70 - 99 mg/dL   Comment 1 Notify RN    PROTIME-INR      Result Value Range   Prothrombin Time 13.9  11.6 - 15.2 seconds   INR 1.09  0.00 - 1.49  CBC      Result Value Range   WBC 18.0 (*) 4.0 - 10.5 K/uL   RBC 4.47  4.22 - 5.81 MIL/uL   Hemoglobin 12.4 (*) 13.0 - 17.0 g/dL   HCT 37.0 (*) 39.0 - 52.0 %   MCV 82.8  78.0 - 100.0 fL   MCH 27.7  26.0 - 34.0 pg   MCHC 33.5  30.0 - 36.0 g/dL   RDW 14.7  11.5 - 15.5 %   Platelets 220  150 - 400 K/uL  BASIC METABOLIC PANEL      Result Value Range   Sodium 137  135 - 145 mEq/L   Potassium 4.2  3.5 - 5.1 mEq/L   Chloride 100  96 - 112 mEq/L   CO2 29  19 - 32 mEq/L   Glucose, Bld 172 (*) 70 - 99 mg/dL   BUN 10  6 - 23 mg/dL   Creatinine, Ser 1.07  0.50 - 1.35 mg/dL   Calcium 8.4  8.4 - 10.5 mg/dL   GFR calc non Af Amer 77 (*) >90 mL/min   GFR calc Af Amer 89 (*) >90 mL/min  GLUCOSE, CAPILLARY      Result Value Range   Glucose-Capillary 121 (*) 70 - 99 mg/dL  GLUCOSE, CAPILLARY      Result Value Range   Glucose-Capillary 150 (*) 70 - 99 mg/dL  GLUCOSE, CAPILLARY      Result Value Range   Glucose-Capillary 135 (*) 70 - 99 mg/dL   Comment 1 Notify RN    GLUCOSE, CAPILLARY      Result Value Range   Glucose-Capillary 133 (*) 70 - 99 mg/dL   Comment 1 Notify RN    PROTIME-INR      Result Value Range   Prothrombin Time 16.7 (*) 11.6 - 15.2 seconds   INR 1.39  0.00 - 1.49  CBC      Result Value Range   WBC 18.7 (*) 4.0 - 10.5 K/uL   RBC 3.96 (*) 4.22 - 5.81 MIL/uL   Hemoglobin 10.5 (*) 13.0 - 17.0 g/dL   HCT 32.7 (*) 39.0 - 52.0 %   MCV 82.6  78.0 - 100.0 fL   MCH 26.5  26.0 - 34.0 pg   MCHC 32.1  30.0 - 36.0 g/dL   RDW 14.6  11.5 - 15.5 %   Platelets 187  150 - 400 K/uL  GLUCOSE, CAPILLARY      Result Value Range   Glucose-Capillary 143 (*) 70 - 99 mg/dL  GLUCOSE, CAPILLARY      Result Value Range   Glucose-Capillary 139 (*) 70 - 99 mg/dL  GLUCOSE, CAPILLARY      Result Value  Range   Glucose-Capillary 158 (*) 70 - 99 mg/dL    Discharge Medications:     Medication List    ASK your doctor about these medications       allopurinol 100 MG tablet  Commonly known as:  ZYLOPRIM  Take 100 mg by mouth 2 (two) times daily.     amLODipine 10 MG tablet  Commonly known as:  NORVASC  Take 10 mg by mouth daily.     atorvastatin 40 MG tablet  Commonly known as:  LIPITOR  Take 40 mg by mouth daily.     cyclobenzaprine 10 MG tablet  Commonly known as:  FLEXERIL  Take 10 mg by mouth daily.     losartan 50 MG tablet  Commonly known as:  COZAAR  Take 50 mg by mouth daily.     metFORMIN 500 MG tablet  Commonly known as:  GLUCOPHAGE  Take 1,000 mg by mouth 2 (two) times daily with a meal.     NASAL ALLERGY NA  Place 1-2 sprays into both nostrils 3 (three) times daily as needed (allergies).     omeprazole 40 MG capsule  Commonly known as:  PRILOSEC  Take 40 mg by mouth daily.     oxyCODONE 20 MG 12 hr tablet  Commonly known as:  OXYCONTIN  Take 30 mg by mouth every 12 (twelve) hours.     oxyCODONE-acetaminophen 5-325 MG per tablet  Commonly known as:  PERCOCET/ROXICET  Take 1 tablet by mouth every 4 (four) hours as needed for pain.     pseudoephedrine 120 MG 12 hr tablet  Commonly known as:  SUDAFED  Take 120 mg by mouth every 12 (twelve) hours.  As needed for allergies     sertraline 25 MG tablet  Commonly known as:  ZOLOFT  Take 25 mg by mouth daily.        Diagnostic Studies: Dg Chest 2 View  09/23/2012   *RADIOLOGY REPORT*  Clinical Data: Preop for right total knee replacement, hypertension, shortness of breath  CHEST - 2 VIEW  Comparison: None.  Findings: Streaky bibasilar interstitial densities, suspect basilar fibrosis.  Normal heart size and vascularity.  No definite pneumonia, collapse, or consolidation.  Negative for CHF, effusion or pneumothorax.  Trachea is midline.  IMPRESSION: Bibasilar scarring versus fibrosis.  No superimposed acute  process.   Original Report Authenticated By: Jerilynn Mages. Annamaria Boots, M.D.    Disposition: Final discharge disposition not confirmed       Signed: DEAN,GREGORY SCOTT 10/03/2012, 12:39 PM

## 2012-10-03 NOTE — Progress Notes (Signed)
ANTICOAGULATION CONSULT NOTE - Follow Up Consult  Pharmacy Consult:  Coumadin Indication: VTE prophylaxis s/p R TKR  No Known Allergies  Patient Measurements: Height: 5' 10.87" (180 cm) Weight: 338 lb 10 oz (153.6 kg) IBW/kg (Calculated) : 74.99  Vital Signs: Temp: 99 F (37.2 C) (08/21 0519) Temp src: Oral (08/21 0519) BP: 128/67 mmHg (08/21 0519) Pulse Rate: 100 (08/21 0519)  Labs:  Recent Labs  10/01/12 0616 10/02/12 0625 10/03/12 0500  HGB  --  12.4* 10.5*  HCT  --  37.0* 32.7*  PLT  --  220 187  APTT 27  --   --   LABPROT 11.6 13.9 16.7*  INR 0.86 1.09 1.39  CREATININE  --  1.07  --     Estimated Creatinine Clearance: 118.8 ml/min (by C-G formula based on Cr of 1.07).       Assessment: 52 yo M s/p right TKR 10/01/12 on Coumadin for post-op VTE prophylaxis.  INR sub-therapeutic but trending up.  No bleeding reported.   Goal of Therapy:  INR 2-3    Plan:  - Coumadin 7.89m PO today - Daily PT / INR    Phillip Rocha D. DMina Marble PharmD, BCPS Pager:  [phone]/21/2014, 10:31 AM

## 2012-10-03 NOTE — Progress Notes (Signed)
Physical Therapy Treatment Patient Details Name: Phillip Rocha MRN: [id number] DOB: 09-18-1960 Today's Date: 10/03/2012 Time: 1340-1400 PT Time Calculation (min): 20 min  PT Assessment / Plan / Recommendation  History of Present Illness s/p RTKA; WBAT, KI   PT Comments   Pf. Found up in  bathroom without KI in place and standing while leaning over RW due to dizziness. Pt.was  Assisted to recliner then left sitting up in chair with specific instructions to call for assist when needing to get up. He is making slow gains.  Encouraged pt. That he needs to work as hard as he can for his surgery to be as successful as possible.   Follow Up Recommendations  SNF;Supervision/Assistance - 24 hour     Does the patient have the potential to tolerate intense rehabilitation     Barriers to Discharge        Equipment Recommendations  Rolling walker with 5" wheels;3in1 (PT)    Recommendations for Other Services    Frequency 7X/week   Progress towards PT Goals Progress towards PT goals: Progressing toward goals  Plan Current plan remains appropriate    Precautions / Restrictions Precautions Precautions: Knee Required Braces or Orthoses:  (pt. found in bathroom without KI in place) Restrictions Weight Bearing Restrictions: Yes RLE Weight Bearing: Weight bearing as tolerated   Pertinent Vitals/Pain See vitals tab     Mobility  Bed Mobility Bed Mobility: Not assessed Transfers Transfers: Stand to Sit Stand to Sit: 4: Min assist;To chair/3-in-1;With armrests;Other (comment) (second person to bring recliner due to pt. dizziness) Details for Transfer Assistance: Cues for technique, safety, hand placement Ambulation/Gait Ambulation/Gait Assistance: 4: Min assist Ambulation Distance (Feet): 8 Feet Assistive device: Rolling walker Ambulation/Gait Assistance Details: Second person to bring reclner due to pt. dizziness; min assist for safety and stability.  Pt. found standing in bathroom ,  leaning over RW due to reported dizziness. Gait Pattern: Step-to pattern    Exercises Total Joint Exercises Ankle Circles/Pumps: AROM;Both;15 reps Quad Sets: AROM;10 reps;Right Short Arc QuadSinclair Ship;Right;5 reps Straight Leg Raises: AAROM;Right;5 reps Goniometric ROM:  (pt. too fatigued for measurement )   PT Diagnosis:    PT Problem List:   PT Treatment Interventions:     PT Goals (current goals can now be found in the care plan section) Acute Rehab PT Goals Patient Stated Goal: back to normal PT Goal Formulation: With patient Time For Goal Achievement: 10/09/12 Potential to Achieve Goals: Good  Visit Information  Last PT Received On: 10/03/12 Assistance Needed: +2 History of Present Illness: s/p RTKA; WBAT, KI    Subjective Data  Subjective: Says "I dont think I can do anything today"  due to having recently been in CPT Patient Stated Goal: back to normal   Cognition  Cognition Arousal/Alertness: Awake/alert Behavior During Therapy: WFL for tasks assessed/performed Overall Cognitive Status: Within Functional Limits for tasks assessed    Balance     End of Session PT - End of Session Equipment Utilized During Treatment: Gait belt;Other (comment) (pt. already up withoug immobilizer) Activity Tolerance: Patient limited by fatigue;Treatment limited secondary to medical complications (Comment);Other (comment) (dizziness) Patient left: in chair;with call bell/phone within reach Nurse Communication: Mobility status   GP     Ladona Ridgel 10/03/2012, 2:23 PM Gerlean Ren PT Acute Rehab Services [place] [phone]

## 2012-10-03 NOTE — Progress Notes (Signed)
Subjective: Pt stable - pain controlled - incision ok   Objective: Vital signs in last 24 hours: Temp:  [98.7 F (37.1 C)-99 F (37.2 C)] 99 F (37.2 C) (08/21 0519) Pulse Rate:  [100-110] 100 (08/21 0519) Resp:  [16-18] 18 (08/21 1200) BP: (128-136)/(67-71) 128/67 mmHg (08/21 0519) SpO2:  [93 %-99 %] 97 % (08/21 0519) Weight:  [153.6 kg (338 lb 10 oz)] 153.6 kg (338 lb 10 oz) (08/21 0900)  Intake/Output from previous day: 08/20 0701 - 08/21 0700 In: -  Out: 614 [Urine:614] Intake/Output this shift: Total I/O In: 240 [P.O.:240] Out: -   Exam:  Neurovascular intact Sensation intact distally Intact pulses distally  Labs:  Recent Labs  10/02/12 0625 10/03/12 0500  HGB 12.4* 10.5*    Recent Labs  10/02/12 0625 10/03/12 0500  WBC 18.0* 18.7*  RBC 4.47 3.96*  HCT 37.0* 32.7*  PLT 220 187    Recent Labs  10/02/12 0625  NA 137  K 4.2  CL 100  CO2 29  BUN 10  CREATININE 1.07  GLUCOSE 172*  CALCIUM 8.4    Recent Labs  10/02/12 0625 10/03/12 0500  INR 1.09 1.39    Assessment/Plan: Plan dc am to snf - incision ok with dressing change today - summary done rx on chart   DEAN,GREGORY SCOTT 10/03/2012, 12:37 PM

## 2012-10-04 LAB — CBC
Hemoglobin: 10.4 g/dL — ABNORMAL LOW (ref 13.0–17.0)
MCHC: 32.2 g/dL (ref 30.0–36.0)
RDW: 14.6 % (ref 11.5–15.5)

## 2012-10-04 LAB — GLUCOSE, CAPILLARY

## 2012-10-04 LAB — PROTIME-INR
INR: 1.16 (ref 0.00–1.49)
Prothrombin Time: 14.6 seconds (ref 11.6–15.2)

## 2012-10-04 MED ORDER — WARFARIN SODIUM 10 MG PO TABS
10.0000 mg | ORAL_TABLET | Freq: Once | ORAL | Status: AC
  Administered 2012-10-04: 10 mg via ORAL
  Filled 2012-10-04 (×2): qty 1

## 2012-10-04 NOTE — Progress Notes (Signed)
Physical Therapy Treatment Patient Details Name: Phillip Rocha MRN: [id number] DOB: 10/12/1960 Today's Date: 10/04/2012 Time: 1047-1100 PT Time Calculation (min): 13 min  PT Assessment / Plan / Recommendation  History of Present Illness s/p RTKA; WBAT, KI   PT Comments   Ambulation gradually improving.  Needs ongoing therapy at SNF level before DC home.  Follow Up Recommendations  SNF;Supervision/Assistance - 24 hour     Does the patient have the potential to tolerate intense rehabilitation     Barriers to Discharge        Equipment Recommendations  Rolling walker with 5" wheels;3in1 (PT)    Recommendations for Other Services    Frequency 7X/week   Progress towards PT Goals Progress towards PT goals: Progressing toward goals  Plan Current plan remains appropriate    Precautions / Restrictions Precautions Precautions: Knee;Fall Required Braces or Orthoses: Knee Immobilizer - Right Restrictions Weight Bearing Restrictions: Yes RLE Weight Bearing: Weight bearing as tolerated   Pertinent Vitals/Pain See vitals tab     Mobility  Bed Mobility Bed Mobility: Supine to Sit;Sitting - Scoot to Edge of Bed;Sit to Supine Supine to Sit: 4: Min assist;With rails;HOB elevated Sitting - Scoot to Edge of Bed: 5: Supervision Sit to Supine: 4: Min assist;With rail;HOB elevated Details for Bed Mobility Assistance: min assist for R LE only Transfers Transfers: Sit to Stand;Stand to Sit Sit to Stand: 4: Min guard;From bed;With upper extremity assist Stand to Sit: 4: Min assist;To chair/3-in-1;To bed;With upper extremity assist;With armrests Details for Transfer Assistance: Cues for technique, safety, hand placement Ambulation/Gait Ambulation/Gait Assistance: 4: Min guard Ambulation Distance (Feet): 60 Feet Assistive device: Rolling walker Ambulation/Gait Assistance Details: Pt. needs cues to stand upright and extend hips; tends to flex at hips and back; min guard assist needed for  safety Gait Pattern: Step-to pattern;Trunk flexed;Decreased hip/knee flexion - right Gait velocity: decreased Stairs: No    Exercises Total Joint Exercises Ankle Circles/Pumps: AROM;Both;15 reps Quad Sets: AROM;Both;15 reps Short Arc QuadSinclair Ship;Right;10 reps Straight Leg Raises: AROM;Right;10 reps Knee Flexion: AROM;Right;10 reps;Seated Goniometric ROM: 0 to 60 active   PT Diagnosis:    PT Problem List:   PT Treatment Interventions:     PT Goals (current goals can now be found in the care plan section) Acute Rehab PT Goals Patient Stated Goal: back to normal PT Goal Formulation: With patient  Visit Information  Last PT Received On: 10/04/12 Assistance Needed: +2 History of Present Illness: s/p RTKA; WBAT, KI    Subjective Data  Subjective: wants to go back to bed after walking Patient Stated Goal: back to normal   Cognition  Cognition Arousal/Alertness: Awake/alert Behavior During Therapy: WFL for tasks assessed/performed Overall Cognitive Status: Within Functional Limits for tasks assessed    Balance     End of Session PT - End of Session Equipment Utilized During Treatment: Gait belt;Right knee immobilizer Activity Tolerance: Patient tolerated treatment well;Patient limited by fatigue Patient left: in bed;with call bell/phone within reach Nurse Communication: Mobility status   GP     Ladona Ridgel 10/04/2012, 11:06 AM [place] [phone]

## 2012-10-04 NOTE — Clinical Social Work Placement (Addendum)
Clinical Social Work Department  CLINICAL SOCIAL WORK PLACEMENT NOTE  10/03/2012  Patient: Steaven Wholey Account [id number] Admit date: 10/01/12  Clinical Social Worker: Rhea Pink MSW Date/time: 10/03/2012 11:30 AM  Clinical Social Work is seeking post-discharge placement for this patient at the following level of care: [place] (*CSW will update this form in Epic as items are completed)  10/03/2012 Patient/family provided with [facility] Department of Clinical Social Work's list of facilities offering this level of care within the geographic area requested by the patient (or if unable, by the patient's family).  10/03/2012 Patient/family informed of their freedom to choose among providers that offer the needed level of care, that participate in Medicare, Medicaid or managed care program needed by the patient, have an available bed and are willing to accept the patient.  10/03/2012 Patient/family informed of [facility]' ownership interest in [facility], as well as of the fact that they are under no obligation to receive care at this facility.  PASARR submitted to EDS on 10/04/2012  PASARR number received from EDS on 10/04/2012 FL2 transmitted to all facilities in geographic area requested by pt/family on 10/03/2012  FL2 transmitted to all facilities within larger geographic area on  Patient informed that his/her managed care company has contracts with or will negotiate with certain facilities, including the following:  Patient/family informed of bed offers received: 10/04/2012 Patient chooses bed at [facility] Physician recommends and patient chooses bed at  Patient to be transferred to on 10/04/2012 Patient to be transferred to facility by [facility] The following physician request were entered in Epic:  Additional Comments:

## 2012-10-04 NOTE — Clinical Social Work Psychosocial (Signed)
Clinical Social Work Department  BRIEF PSYCHOSOCIAL ASSESSMENT  Patient: Phillip Rocha  Account Number: [id number]  Admit date: 10/01/12 Clinical Social Worker Rhea Pink, MSW Date/Time:  Referred by: Physician Date Referred: 10/01/12 Referred for   SNF Placement   Other Referral:  Interview type: Patient  Other interview type: PSYCHOSOCIAL DATA  Living Status: Alone Admitted from facility:  Level of care:  Primary support name: Theodis Aguas Primary support relationship to patient: Friend Degree of support available:  fair  CURRENT CONCERNS  Current Concerns   Post-Acute Placement   Other Concerns:  SOCIAL WORK ASSESSMENT / PLAN  CSW met with pt re: PT recommendation for SNF.   Pt lives alone  CSW explained placement process and answered questions.   Pt reports [employer]  as his preference    CSW completed FL2 and initiated SNF search.     Assessment/plan status: Information/Referral to [employer]  Other assessment/ plan:  Information/referral to community resources:  SNF     PATIENT'S/FAMILY'S RESPONSE TO PLAN OF CARE:  Pt  reports he is agreeable to ST SNF in order to increase strength and independence with mobility prior to returning home  Pt verbalized understanding of placement process and appreciation for CSW assist.   Rhea Pink, MSW [phone]

## 2012-10-04 NOTE — Progress Notes (Signed)
Physical Therapy Treatment Patient Details Name: Phillip Rocha MRN: [id number] DOB: 1960/12/04 Today's Date: 10/04/2012 Time: 6568-1275 PT Time Calculation (min): 24 min  PT Assessment / Plan / Recommendation  History of Present Illness s/p RTKA; WBAT, KI   PT Comments   Pt. Making progress with strength and ROM.  Will work on gait training next session.  Pt. Needs a lot of encouragement.  Follow Up Recommendations  SNF;Supervision/Assistance - 24 hour     Does the patient have the potential to tolerate intense rehabilitation     Barriers to Discharge        Equipment Recommendations  Rolling walker with 5" wheels;3in1 (PT)    Recommendations for Other Services    Frequency 7X/week   Progress towards PT Goals Progress towards PT goals: Progressing toward goals  Plan Current plan remains appropriate    Precautions / Restrictions Precautions Precautions: Knee;Fall Required Braces or Orthoses: Knee Immobilizer - Right Restrictions Weight Bearing Restrictions: Yes RLE Weight Bearing: Weight bearing as tolerated   Pertinent Vitals/Pain See vitals tab     Mobility  Bed Mobility Bed Mobility: Not assessed Transfers Transfers: Not assessed Ambulation/Gait Ambulation/Gait Assistance: Not tested (comment)    Exercises Total Joint Exercises Ankle Circles/Pumps: AROM;Both;15 reps Quad Sets: AROM;Both;15 reps Short Arc QuadSinclair Ship;Right;10 reps Straight Leg Raises: AROM;Right;10 reps Knee Flexion: AROM;Right;10 reps;Seated Goniometric ROM: 0 to 60 active   PT Diagnosis:    PT Problem List:   PT Treatment Interventions:     PT Goals (current goals can now be found in the care plan section) Acute Rehab PT Goals Patient Stated Goal: back to normal  Visit Information  Last PT Received On: 10/04/12 Assistance Needed: +2 History of Present Illness: s/p RTKA; WBAT, KI    Subjective Data  Patient Stated Goal: back to normal   Cognition   Cognition Arousal/Alertness: Awake/alert Behavior During Therapy: WFL for tasks assessed/performed Overall Cognitive Status: Within Functional Limits for tasks assessed    Balance     End of Session     GP     Ladona Ridgel 10/04/2012, 10:29 AM Gerlean Ren PT Acute Rehab Services [phone] [place] [phone]

## 2012-10-04 NOTE — Progress Notes (Signed)
Clinical [facility]  CSW addressed all family questions and concerns. CSW copied chart and added all important documents. CSW also set up patient transportation with Diplomatic Services operational officer. Clinical Social Worker will sign off for now as social work intervention is no longer needed.   Rhea Pink, MSW [phone]

## 2012-10-04 NOTE — Progress Notes (Signed)
ANTICOAGULATION CONSULT NOTE - Follow Up Consult  Pharmacy Consult:  Coumadin Indication: VTE prophylaxis s/p R TKR  No Known Allergies  Patient Measurements: Height: 5' 10.87" (180 cm) Weight: 338 lb 10 oz (153.6 kg) IBW/kg (Calculated) : 74.99  Vital Signs: Temp: 98.9 F (37.2 C) (08/22 1431) BP: 130/90 mmHg (08/22 1431) Pulse Rate: 105 (08/22 1431)  Labs:  Recent Labs  10/02/12 0625 10/03/12 0500 10/04/12 0615  HGB 12.4* 10.5* 10.4*  HCT 37.0* 32.7* 32.3*  PLT 220 187 192  LABPROT 13.9 16.7* 14.6  INR 1.09 1.39 1.16  CREATININE 1.07  --   --     Estimated Creatinine Clearance: 118.8 ml/min (by C-G formula based on Cr of 1.07).  Assessment: 52 yo M s/p right TKR 10/01/12 on Coumadin for post-op VTE prophylaxis.  INR remains sub-therapeutic.  No bleeding reported.  Goal of Therapy:  INR 2-3   Plan:  - Coumadin 10 mg PO today - Daily PT / INR  Uvaldo Rising, BCPS  Clinical Pharmacist Pager [phone]  10/04/2012 4:13 PM

## 2012-10-07 ENCOUNTER — Other Ambulatory Visit: Payer: Self-pay

## 2012-10-07 ENCOUNTER — Non-Acute Institutional Stay (SKILLED_NURSING_FACILITY): Payer: Medicare Other | Admitting: Adult Health

## 2012-10-07 DIAGNOSIS — I10 Essential (primary) hypertension: Secondary | ICD-10-CM

## 2012-10-07 DIAGNOSIS — E119 Type 2 diabetes mellitus without complications: Secondary | ICD-10-CM

## 2012-10-07 DIAGNOSIS — F329 Major depressive disorder, single episode, unspecified: Secondary | ICD-10-CM

## 2012-10-07 DIAGNOSIS — K219 Gastro-esophageal reflux disease without esophagitis: Secondary | ICD-10-CM

## 2012-10-07 DIAGNOSIS — M1711 Unilateral primary osteoarthritis, right knee: Secondary | ICD-10-CM

## 2012-10-07 DIAGNOSIS — M62838 Other muscle spasm: Secondary | ICD-10-CM

## 2012-10-07 DIAGNOSIS — M1710 Unilateral primary osteoarthritis, unspecified knee: Secondary | ICD-10-CM

## 2012-10-07 DIAGNOSIS — E785 Hyperlipidemia, unspecified: Secondary | ICD-10-CM

## 2012-10-07 DIAGNOSIS — M109 Gout, unspecified: Secondary | ICD-10-CM

## 2012-10-07 MED ORDER — OXYCODONE-ACETAMINOPHEN 5-325 MG PO TABS: ORAL_TABLET | ORAL | Status: DC

## 2012-10-09 ENCOUNTER — Non-Acute Institutional Stay (SKILLED_NURSING_FACILITY): Payer: Medicare Other | Admitting: Internal Medicine

## 2012-10-09 DIAGNOSIS — E119 Type 2 diabetes mellitus without complications: Secondary | ICD-10-CM

## 2012-10-09 DIAGNOSIS — M25561 Pain in right knee: Secondary | ICD-10-CM

## 2012-10-09 DIAGNOSIS — I10 Essential (primary) hypertension: Secondary | ICD-10-CM

## 2012-10-09 DIAGNOSIS — M1710 Unilateral primary osteoarthritis, unspecified knee: Secondary | ICD-10-CM

## 2012-10-09 DIAGNOSIS — M1711 Unilateral primary osteoarthritis, right knee: Secondary | ICD-10-CM

## 2012-10-09 DIAGNOSIS — M25569 Pain in unspecified knee: Secondary | ICD-10-CM

## 2012-10-16 ENCOUNTER — Other Ambulatory Visit: Payer: Self-pay

## 2012-10-16 MED ORDER — OXYCODONE-ACETAMINOPHEN 5-325 MG PO TABS: ORAL_TABLET | ORAL | Status: DC

## 2012-10-16 NOTE — Telephone Encounter (Signed)
Verified instructions for refill request reflect manual request sent by nursing home   .

## 2012-10-18 ENCOUNTER — Non-Acute Institutional Stay (SKILLED_NURSING_FACILITY): Payer: Medicare Other | Admitting: Adult Health

## 2012-10-18 DIAGNOSIS — K219 Gastro-esophageal reflux disease without esophagitis: Secondary | ICD-10-CM

## 2012-10-18 DIAGNOSIS — I10 Essential (primary) hypertension: Secondary | ICD-10-CM

## 2012-10-18 DIAGNOSIS — M62838 Other muscle spasm: Secondary | ICD-10-CM

## 2012-10-18 DIAGNOSIS — E119 Type 2 diabetes mellitus without complications: Secondary | ICD-10-CM

## 2012-10-18 DIAGNOSIS — E785 Hyperlipidemia, unspecified: Secondary | ICD-10-CM

## 2012-10-18 DIAGNOSIS — M109 Gout, unspecified: Secondary | ICD-10-CM

## 2012-10-18 DIAGNOSIS — M1711 Unilateral primary osteoarthritis, right knee: Secondary | ICD-10-CM

## 2012-10-18 DIAGNOSIS — F329 Major depressive disorder, single episode, unspecified: Secondary | ICD-10-CM

## 2012-10-18 DIAGNOSIS — M1710 Unilateral primary osteoarthritis, unspecified knee: Secondary | ICD-10-CM

## 2012-10-20 ENCOUNTER — Encounter: Payer: Self-pay | Admitting: Adult Health

## 2012-10-20 DIAGNOSIS — M62838 Other muscle spasm: Secondary | ICD-10-CM | POA: Insufficient documentation

## 2012-10-20 DIAGNOSIS — M1711 Unilateral primary osteoarthritis, right knee: Secondary | ICD-10-CM | POA: Insufficient documentation

## 2012-10-20 DIAGNOSIS — K219 Gastro-esophageal reflux disease without esophagitis: Secondary | ICD-10-CM | POA: Insufficient documentation

## 2012-10-20 DIAGNOSIS — E785 Hyperlipidemia, unspecified: Secondary | ICD-10-CM | POA: Insufficient documentation

## 2012-10-20 DIAGNOSIS — F329 Major depressive disorder, single episode, unspecified: Secondary | ICD-10-CM | POA: Insufficient documentation

## 2012-10-20 DIAGNOSIS — I10 Essential (primary) hypertension: Secondary | ICD-10-CM | POA: Insufficient documentation

## 2012-10-20 DIAGNOSIS — E119 Type 2 diabetes mellitus without complications: Secondary | ICD-10-CM | POA: Insufficient documentation

## 2012-10-20 DIAGNOSIS — M109 Gout, unspecified: Secondary | ICD-10-CM | POA: Insufficient documentation

## 2012-10-20 NOTE — Progress Notes (Signed)
Patient ID: Phillip Rocha, male   DOB: 1960/11/26, 52 y.o.   MRN: [id number]        PROGRESS NOTE  DATE: 10/18/2012   FACILITY: [facility]  LEVEL OF CARE: SNF (31)   CHIEF COMPLAINT:  Discharge Visit  HISTORY OF PRESENT ILLNESS: This is a 52 year old male who is for discharge home with outpatient care.. He has been admitted to [facility] on 10/04/12 from [facility] with osteoarthritis status post right total knee arthroplasty.Patient was admitted to this facility for short-term rehabilitation. Albertha Ghee has cleared the patient for discharge.  Reassessment of ongoing problem(s):  HTN: Pt 's HTN remains stable.  Denies CP, sob, DOE, pedal edema, headaches, dizziness or visual disturbances.  No complications from the medications currently being used.  Last BP : 126/69  DM:pt's DM remains stable.  Pt denies polyuria, polydipsia, polyphagia, changes in vision or hypoglycemic episodes.  No complications noted from the medication presently being used.    GERD: pt's GERD is stable.  Denies ongoing heartburn, abd. Pain, nausea or vomiting.  Currently on a PPI & tolerates it without any adverse reactions.  PAST MEDICAL HISTORY : Reviewed.  No changes.  CURRENT MEDICATIONS: Reviewed per [facility]  REVIEW OF SYSTEMS:  GENERAL: no change in appetite, no fatigue, no weight changes, no fever, chills or weakness RESPIRATORY: no cough, SOB, DOE, wheezing, hemoptysis CARDIAC: no chest pain, edema or palpitations GI: no abdominal pain, diarrhea, constipation, heart burn, nausea or vomiting  PHYSICAL EXAMINATION  VS:  T97.6      P82       RR18      BP126/69           WT 344.5(Lb)  GENERAL: no acute distress, obese NECK: supple, trachea midline, no neck masses, no thyroid tenderness, no thyromegaly LYMPHATICS: no LAN in the neck, no supraclavicular LAN RESPIRATORY: breathing is even & unlabored, BS CTAB CARDIAC: RRR, no murmur,no extra heart sounds, no edema GI: abdomen  soft, normal BS, no masses, no tenderness, no hepatomegaly, no splenomegaly PSYCHIATRIC: the patient is alert & oriented to person, affect & behavior appropriate  LABS/RADIOLOGY: 10/01/12 WBC 18.0 hemoglobin 12.4 hematocrit 37.0 sodium 137 potassium 4.2 glucose 172 BUN 10 creatinine 1.07 calcium 8.4  ASSESSMENT/PLAN:  Osteoarthritis status post right total knee arthroplasty -  for outpatient care  Hypertension - well controlled  Diabetes mellitus, type II - well controlled   GERD - stable  Gout - stable  Muscle spasm - stable  Hyperlipidemia - stable  Depression - stable  Right knee pain - discontinue Percocet 5/325 mg 1 tab PO Q 4 hours PRN; start Percocet 10/325 mg 1 tab PO Q 4 hours PRN; continue Oxycontin CR 30 mg 1 tab PO Q 12 hours  I have filled out patient's discharge paperwork and written prescriptions.  Patient will have outpatient care.   Total discharge time: Less than 30 minutes Discharge time involved coordination of the discharge process with Education officer, museum, nursing staff and therapy department.   CPT CODE: 84696

## 2012-10-20 NOTE — Progress Notes (Signed)
Patient ID: Phillip Rocha, male   DOB: [birth year], 52 y.o.   MRN: [id number]       PROGRESS NOTE  DATE: 10/07/2012  FACILITY:  [facility]  LEVEL OF CARE: SNF (31)  Acute Visit  CHIEF COMPLAINT:  Follow-up hospitalization  HISTORY OF PRESENT ILLNESS: This is a 52 year old male who has had been admitted to [facility] on 10/04/12 from [facility] with osteoarthritis is status post right total knee arthroplasty. He has been admitted for a short-term rehabilitation.   PAST MEDICAL HISTORY : Reviewed.  No changes.  CURRENT MEDICATIONS: Reviewed per [facility]  REVIEW OF SYSTEMS:  GENERAL: no change in appetite, no fatigue, no weight changes, no fever, chills or weakness RESPIRATORY: no cough, SOB, DOE,, wheezing, hemoptysis CARDIAC: no chest pain, edema or palpitations GI: no abdominal pain, diarrhea, constipation, heart burn, nausea or vomiting  PHYSICAL EXAMINATION  VS:  T 99.4       P 105       RR 20       BP 127/76      POX 99 %       WT 345 (Lb)  GENERAL: no acute distress, normal body habitus EYES: conjunctivae normal, sclerae normal, normal eye lids NECK: supple, trachea midline, no neck masses, no thyroid tenderness, no thyromegaly LYMPHATICS: no LAN in the neck, no supraclavicular LAN RESPIRATORY: breathing is even & unlabored, BS CTAB CARDIAC: RRR, no murmur,no extra heart sounds, no edema GI: abdomen soft, normal BS, no masses, no tenderness, no hepatomegaly, no splenomegaly PSYCHIATRIC: the patient is alert & oriented to person, affect & behavior appropriate  LABS/RADIOLOGY: 10/01/12 WBC 18.0 hemoglobin 12.4 hematocrit 37.0 sodium 137 potassium 4.2 glucose 172 BUN 10 creatinine 1.07 calcium 8.4  ASSESSMENT/PLAN:  Osteoarthritis status post right total knee arthroplasty -  for PT and OT  Hypertension - well controlled  Diabetes mellitus, type II - well controlled   GERD - stable  Gout - stable  Muscle spasm - stable  Hyperlipidemia -  stable  Depression - stable   CPT CODE: 52841
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
## 2012-11-11 DIAGNOSIS — M25569 Pain in unspecified knee: Secondary | ICD-10-CM | POA: Insufficient documentation

## 2012-11-25 ENCOUNTER — Other Ambulatory Visit: Payer: Self-pay | Admitting: Adult Health

## 2012-12-19 ENCOUNTER — Other Ambulatory Visit: Payer: Self-pay

## 2012-12-27 ENCOUNTER — Other Ambulatory Visit: Payer: Self-pay | Admitting: Orthopedic Surgery

## 2012-12-27 DIAGNOSIS — M545 Low back pain: Secondary | ICD-10-CM

## 2013-01-14 ENCOUNTER — Ambulatory Visit
Admission: RE | Admit: 2013-01-14 | Discharge: 2013-01-14 | Disposition: A | Discharge status: Home / Self Care | Payer: Medicare Other | Source: Ambulatory Visit | Attending: Orthopedic Surgery | Admitting: Orthopedic Surgery

## 2013-01-14 DIAGNOSIS — M545 Low back pain: Secondary | ICD-10-CM

## 2013-02-03 ENCOUNTER — Other Ambulatory Visit (HOSPITAL_COMMUNITY): Payer: Self-pay | Admitting: Specialist

## 2013-02-13 HISTORY — PX: BACK SURGERY: SHX140

## 2013-02-25 ENCOUNTER — Encounter (HOSPITAL_COMMUNITY): Payer: Self-pay | Admitting: Respiratory Therapy

## 2013-03-03 ENCOUNTER — Encounter (HOSPITAL_COMMUNITY)
Admission: RE | Admit: 2013-03-03 | Discharge: 2013-03-03 | Disposition: A | Discharge status: Home / Self Care | Payer: Medicare HMO | Source: Ambulatory Visit | Attending: Specialist | Admitting: Specialist

## 2013-03-03 ENCOUNTER — Encounter (HOSPITAL_COMMUNITY): Payer: Self-pay

## 2013-03-03 DIAGNOSIS — Z01818 Encounter for other preprocedural examination: Secondary | ICD-10-CM | POA: Insufficient documentation

## 2013-03-03 DIAGNOSIS — Z01812 Encounter for preprocedural laboratory examination: Secondary | ICD-10-CM | POA: Insufficient documentation

## 2013-03-03 HISTORY — DX: Morbid (severe) obesity due to excess calories: E66.01

## 2013-03-03 HISTORY — DX: Unspecified osteoarthritis, unspecified site: M19.90

## 2013-03-03 HISTORY — DX: Arthropathic psoriasis, unspecified: L40.50

## 2013-03-03 HISTORY — DX: Psoriasis, unspecified: L40.9

## 2013-03-03 HISTORY — DX: Claustrophobia: F40.240

## 2013-03-03 LAB — URINALYSIS, ROUTINE W REFLEX MICROSCOPIC
Bilirubin Urine: NEGATIVE
GLUCOSE, UA: NEGATIVE mg/dL
Hgb urine dipstick: NEGATIVE
Ketones, ur: NEGATIVE mg/dL
LEUKOCYTES UA: NEGATIVE
NITRITE: NEGATIVE
PH: 5 (ref 5.0–8.0)
Protein, ur: NEGATIVE mg/dL
SPECIFIC GRAVITY, URINE: 1.02 (ref 1.005–1.030)
Urobilinogen, UA: 0.2 mg/dL (ref 0.0–1.0)

## 2013-03-03 LAB — COMPREHENSIVE METABOLIC PANEL
ALT: 22 U/L (ref 0–53)
AST: 24 U/L (ref 0–37)
Albumin: 3.8 g/dL (ref 3.5–5.2)
Alkaline Phosphatase: 93 U/L (ref 39–117)
BUN: 12 mg/dL (ref 6–23)
CHLORIDE: 99 meq/L (ref 96–112)
CO2: 24 mEq/L (ref 19–32)
Calcium: 9.4 mg/dL (ref 8.4–10.5)
Creatinine, Ser: 0.84 mg/dL (ref 0.50–1.35)
GFR calc Af Amer: >90 mL/min (ref >90)
GFR calc non Af Amer: >90 mL/min (ref >90)
Glucose, Bld: 147 mg/dL — ABNORMAL HIGH (ref 70–99)
POTASSIUM: 4.6 meq/L (ref 3.7–5.3)
SODIUM: 138 meq/L (ref 137–147)
TOTAL PROTEIN: 7.7 g/dL (ref 6.0–8.3)
Total Bilirubin: 0.2 mg/dL — ABNORMAL LOW (ref 0.3–1.2)

## 2013-03-03 LAB — CBC
HEMATOCRIT: 40.5 % (ref 39.0–52.0)
HEMOGLOBIN: 13.2 g/dL (ref 13.0–17.0)
MCH: 25.9 pg — ABNORMAL LOW (ref 26.0–34.0)
MCHC: 32.6 g/dL (ref 30.0–36.0)
MCV: 79.6 fL (ref 78.0–100.0)
Platelets: 259 10*3/uL (ref 150–400)
RBC: 5.09 MIL/uL (ref 4.22–5.81)
RDW: 16.4 % — ABNORMAL HIGH (ref 11.5–15.5)
WBC: 12.8 10*3/uL — AB (ref 4.0–10.5)

## 2013-03-03 LAB — SURGICAL PCR SCREEN
MRSA, PCR: NEGATIVE
Staphylococcus aureus: NEGATIVE

## 2013-03-03 NOTE — Pre-Procedure Instructions (Signed)
Phillip Rocha  03/03/2013   Your procedure is scheduled on:  Tuesday, January 27  Report to [facility] Short Stay , Main entrance [facility] at [place] AM.  Call this number if you have problems the morning of surgery: [phone]   Remember:   Do not eat food or drink liquids after midnight.Monday night    Take these medicines the morning of surgery with A SIP OF WATER: amlodipine, omeprazole, pain medication if needed, sertraline,   Do not wear jewelry, make-up or nail polish.  Do not wear lotions, powders, or perfumes. You may wear deodorant.  Do not shave 48 hours prior to surgery. Men may shave face and neck.  Do not bring valuables to the hospital.  [facility] is not responsible   for any belongings or valuables.               Contacts, dentures or bridgework may not be worn into surgery.  Leave suitcase in the car. After surgery it may be brought to your room.  For patients admitted to the hospital, discharge time is determined by your     treatment team.                Special Instructions: Shower using CHG 2 nights before surgery and the night before surgery.  If you shower the day of surgery use CHG.  Use special wash - you have one bottle of CHG for all showers.  You should use approximately 1/3 of the bottle for each shower.   Please read over the following fact sheets that you were given: Pain Booklet, Coughing and Deep Breathing, Blood Transfusion Information and Surgical Site Infection Prevention

## 2013-03-03 NOTE — Pre-Procedure Instructions (Signed)
Phillip Rocha  03/03/2013   Your procedure is scheduled on:  Tuesday, January 27  Report to Short stay at [facility], Main Entrance at 0530 AM.  Call this number if you have problems the morning of surgery: [phone]   Remember:   Do not eat food or drink liquids after midnight. Monday night   Take these medicines the morning of surgery with A SIP OF WATER: amlodipine, omeprazole, sertraline, pain medication if needed   Do not wear jewelry, make-up or nail polish.  Do not wear lotions, powders, or perfumes. You may wear deodorant.  Do not shave 48 hours prior to surgery. Men may shave face and neck.  Do not bring valuables to the hospital.  [facility] is not responsible   for any belongings or valuables.               Contacts, dentures or bridgework may not be worn into surgery.  Leave suitcase in the car. After surgery it may be brought to your room.  For patients admitted to the hospital, discharge time is determined by your  treatment team.                  Special Instructions: Shower using CHG 2 nights before surgery and the night before surgery.  If you shower the day of surgery use CHG.  Use special wash - you have one bottle of CHG for all showers.  You should use approximately 1/3 of the bottle for each shower.   Please read over the following fact sheets that you were given: Pain Booklet, Coughing and Deep Breathing, Blood Transfusion Information, MRSA Information and Surgical Site Infection Prevention

## 2013-03-03 NOTE — Progress Notes (Signed)
STOP-Bang score 6/10; results sent to PCP Dr Velna Hatchet @ [facility] on [address], [place], [place]
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
## 2013-03-07 NOTE — Progress Notes (Signed)
Anesthesia chart review: Patient is a 53 year old male scheduled for L3-5 central laminectomy, L5-S1 hemi laminectomy, L3-S1 fusion on 03/11/13 by Dr. Louanne Skye.     History includes morbid obesity (BMI 47), DM2, HTN, GERD, anxiety, osteoarthritis, psoriatic arthritis, claustrophobia, cholecystectomy, non-smoker, right TKA 10/01/12. OSA screening score was 6. PCP is Dr. Velna Hatchet at [facility]) who felt patient was acceptable risk for this procedure. Rheumatologist is Dr. Estanislado Pandy. He recently moved from [place] to [facility].   Patient had a cardiology evaluation by Dr. Daneen Schick [facility]) on 09/25/12. EKG was reviewed. BP was 144/96. A nuclear stress test was ordered and done on 09/26/12. Results showed inferior defect consistent with diaphragmatic attenuation, small fixed mild anterior defect likely soft tissue attenuation, no ischemia, post stress EF 50%. Low risk scan. (Scanned under Media tab, Correspondence 10/01/12.)  EKG on 09/23/12 showed SR with occasional PACs, inferior infarct (age undetermined).  CXR on 09/23/12 showed: Bibasilar scarring versus fibrosis. No superimposed acute process.   Preoperative labs noted.    Anticipate that he can proceed as planned.  [name]/Anesthesiology Phone [phone] 03/07/2013 9:55 AM

## 2013-03-10 MED ORDER — DEXTROSE 5 % IV SOLN
3.0000 g | INTRAVENOUS | Status: AC
  Administered 2013-03-11 (×2): 3 g via INTRAVENOUS
  Filled 2013-03-10: qty 3000

## 2013-03-11 ENCOUNTER — Inpatient Hospital Stay (HOSPITAL_COMMUNITY)
Admission: RE | Admit: 2013-03-11 | Discharge: 2013-03-17 | DRG: 459 | Disposition: A | Discharge status: Skilled Nursing Facility | Payer: Medicare HMO | Source: Ambulatory Visit | Attending: Specialist | Admitting: Specialist

## 2013-03-11 ENCOUNTER — Encounter (HOSPITAL_COMMUNITY)
Admission: RE | Disposition: A | Discharge status: Skilled Nursing Facility | Payer: Commercial Managed Care - HMO | Source: Ambulatory Visit | Attending: Specialist

## 2013-03-11 ENCOUNTER — Inpatient Hospital Stay (HOSPITAL_COMMUNITY): Payer: Medicare HMO | Admitting: Anesthesiology

## 2013-03-11 ENCOUNTER — Encounter (HOSPITAL_COMMUNITY): Payer: Self-pay | Admitting: *Deleted

## 2013-03-11 ENCOUNTER — Inpatient Hospital Stay (HOSPITAL_COMMUNITY): Payer: Medicare HMO

## 2013-03-11 ENCOUNTER — Encounter (HOSPITAL_COMMUNITY): Payer: Medicare HMO | Admitting: Anesthesiology

## 2013-03-11 DIAGNOSIS — L408 Other psoriasis: Secondary | ICD-10-CM | POA: Diagnosis present

## 2013-03-11 DIAGNOSIS — D759 Disease of blood and blood-forming organs, unspecified: Secondary | ICD-10-CM | POA: Diagnosis present

## 2013-03-11 DIAGNOSIS — E662 Morbid (severe) obesity with alveolar hypoventilation: Secondary | ICD-10-CM | POA: Diagnosis present

## 2013-03-11 DIAGNOSIS — J9601 Acute respiratory failure with hypoxia: Secondary | ICD-10-CM

## 2013-03-11 DIAGNOSIS — F40298 Other specified phobia: Secondary | ICD-10-CM | POA: Diagnosis present

## 2013-03-11 DIAGNOSIS — N170 Acute kidney failure with tubular necrosis: Secondary | ICD-10-CM

## 2013-03-11 DIAGNOSIS — G4733 Obstructive sleep apnea (adult) (pediatric): Secondary | ICD-10-CM

## 2013-03-11 DIAGNOSIS — M47817 Spondylosis without myelopathy or radiculopathy, lumbosacral region: Secondary | ICD-10-CM | POA: Diagnosis present

## 2013-03-11 DIAGNOSIS — K219 Gastro-esophageal reflux disease without esophagitis: Secondary | ICD-10-CM | POA: Diagnosis present

## 2013-03-11 DIAGNOSIS — N058 Unspecified nephritic syndrome with other morphologic changes: Secondary | ICD-10-CM | POA: Diagnosis present

## 2013-03-11 DIAGNOSIS — F411 Generalized anxiety disorder: Secondary | ICD-10-CM | POA: Diagnosis present

## 2013-03-11 DIAGNOSIS — D62 Acute posthemorrhagic anemia: Secondary | ICD-10-CM | POA: Diagnosis not present

## 2013-03-11 DIAGNOSIS — E875 Hyperkalemia: Secondary | ICD-10-CM

## 2013-03-11 DIAGNOSIS — M48061 Spinal stenosis, lumbar region without neurogenic claudication: Secondary | ICD-10-CM | POA: Diagnosis present

## 2013-03-11 DIAGNOSIS — K560 Paralytic ileus: Secondary | ICD-10-CM | POA: Diagnosis not present

## 2013-03-11 DIAGNOSIS — Z96659 Presence of unspecified artificial knee joint: Secondary | ICD-10-CM

## 2013-03-11 DIAGNOSIS — E1129 Type 2 diabetes mellitus with other diabetic kidney complication: Secondary | ICD-10-CM | POA: Diagnosis present

## 2013-03-11 DIAGNOSIS — J9600 Acute respiratory failure, unspecified whether with hypoxia or hypercapnia: Secondary | ICD-10-CM | POA: Diagnosis not present

## 2013-03-11 DIAGNOSIS — L4050 Arthropathic psoriasis, unspecified: Secondary | ICD-10-CM | POA: Diagnosis present

## 2013-03-11 DIAGNOSIS — M5126 Other intervertebral disc displacement, lumbar region: Principal | ICD-10-CM | POA: Diagnosis present

## 2013-03-11 DIAGNOSIS — M4316 Spondylolisthesis, lumbar region: Secondary | ICD-10-CM

## 2013-03-11 DIAGNOSIS — E119 Type 2 diabetes mellitus without complications: Secondary | ICD-10-CM

## 2013-03-11 DIAGNOSIS — M48062 Spinal stenosis, lumbar region with neurogenic claudication: Secondary | ICD-10-CM | POA: Diagnosis present

## 2013-03-11 DIAGNOSIS — E860 Dehydration: Secondary | ICD-10-CM | POA: Diagnosis present

## 2013-03-11 DIAGNOSIS — E785 Hyperlipidemia, unspecified: Secondary | ICD-10-CM | POA: Diagnosis present

## 2013-03-11 DIAGNOSIS — N179 Acute kidney failure, unspecified: Secondary | ICD-10-CM

## 2013-03-11 DIAGNOSIS — K567 Ileus, unspecified: Secondary | ICD-10-CM

## 2013-03-11 DIAGNOSIS — R000 Tachycardia, unspecified: Secondary | ICD-10-CM | POA: Diagnosis present

## 2013-03-11 DIAGNOSIS — M4310 Spondylolisthesis, site unspecified: Secondary | ICD-10-CM | POA: Diagnosis present

## 2013-03-11 DIAGNOSIS — D72829 Elevated white blood cell count, unspecified: Secondary | ICD-10-CM | POA: Diagnosis present

## 2013-03-11 DIAGNOSIS — M79609 Pain in unspecified limb: Secondary | ICD-10-CM | POA: Diagnosis present

## 2013-03-11 DIAGNOSIS — I10 Essential (primary) hypertension: Secondary | ICD-10-CM

## 2013-03-11 DIAGNOSIS — Z6841 Body Mass Index (BMI) 40.0 and over, adult: Secondary | ICD-10-CM

## 2013-03-11 LAB — POCT I-STAT 7, (LYTES, BLD GAS, ICA,H+H)
Bicarbonate: 26.5 mEq/L — ABNORMAL HIGH (ref 20.0–24.0)
CALCIUM ION: 1.11 mmol/L — AB (ref 1.12–1.23)
HEMATOCRIT: 33 % — AB (ref 39.0–52.0)
HEMOGLOBIN: 11.2 g/dL — AB (ref 13.0–17.0)
O2 Saturation: 100 %
PH ART: 7.309 — AB (ref 7.350–7.450)
POTASSIUM: 5.2 meq/L (ref 3.7–5.3)
Sodium: 136 mEq/L — ABNORMAL LOW (ref 137–147)
TCO2: 28 mmol/L (ref 0–100)
pCO2 arterial: 52.8 mmHg — ABNORMAL HIGH (ref 35.0–45.0)
pO2, Arterial: 246 mmHg — ABNORMAL HIGH (ref 80.0–100.0)

## 2013-03-11 LAB — GLUCOSE, CAPILLARY
GLUCOSE-CAPILLARY: 150 mg/dL — AB (ref 70–99)
Glucose-Capillary: 137 mg/dL — ABNORMAL HIGH (ref 70–99)
Glucose-Capillary: 168 mg/dL — ABNORMAL HIGH (ref 70–99)

## 2013-03-11 LAB — POCT I-STAT 4, (NA,K, GLUC, HGB,HCT)
GLUCOSE: 172 mg/dL — AB (ref 70–99)
Glucose, Bld: 168 mg/dL — ABNORMAL HIGH (ref 70–99)
Glucose, Bld: 191 mg/dL — ABNORMAL HIGH (ref 70–99)
HCT: 33 % — ABNORMAL LOW (ref 39.0–52.0)
HCT: 31 % — ABNORMAL LOW (ref 39.0–52.0)
HCT: 32 % — ABNORMAL LOW (ref 39.0–52.0)
HEMOGLOBIN: 10.5 g/dL — AB (ref 13.0–17.0)
Hemoglobin: 11.2 g/dL — ABNORMAL LOW (ref 13.0–17.0)
Hemoglobin: 10.9 g/dL — ABNORMAL LOW (ref 13.0–17.0)
POTASSIUM: 4.6 meq/L (ref 3.7–5.3)
Potassium: 4.6 mEq/L (ref 3.7–5.3)
Potassium: 6 mEq/L — ABNORMAL HIGH (ref 3.7–5.3)
Sodium: 137 mEq/L (ref 137–147)
Sodium: 138 mEq/L (ref 137–147)
Sodium: 134 mEq/L — ABNORMAL LOW (ref 137–147)

## 2013-03-11 SURGERY — POSTERIOR LUMBAR FUSION 3 LEVEL
Anesthesia: General | Site: Back

## 2013-03-11 MED ORDER — ONDANSETRON HCL 4 MG/2ML IJ SOLN
INTRAMUSCULAR | Status: DC | PRN
  Administered 2013-03-11: 4 mg via INTRAVENOUS

## 2013-03-11 MED ORDER — KETOROLAC TROMETHAMINE 30 MG/ML IJ SOLN
INTRAMUSCULAR | Status: AC
  Filled 2013-03-11: qty 1

## 2013-03-11 MED ORDER — INSULIN ASPART 100 UNIT/ML ~~LOC~~ SOLN
0.0000 [IU] | Freq: Three times a day (TID) | SUBCUTANEOUS | Status: DC
  Administered 2013-03-12: 3 [IU] via SUBCUTANEOUS
  Administered 2013-03-12: 2 [IU] via SUBCUTANEOUS
  Administered 2013-03-12: 5 [IU] via SUBCUTANEOUS
  Administered 2013-03-13 – 2013-03-14 (×4): 3 [IU] via SUBCUTANEOUS
  Administered 2013-03-14: 2 [IU] via SUBCUTANEOUS
  Administered 2013-03-14: 3 [IU] via SUBCUTANEOUS
  Administered 2013-03-15 – 2013-03-17 (×7): 2 [IU] via SUBCUTANEOUS

## 2013-03-11 MED ORDER — LIDOCAINE HCL (CARDIAC) 20 MG/ML IV SOLN
INTRAVENOUS | Status: DC | PRN
  Administered 2013-03-11: 60 mg via INTRAVENOUS

## 2013-03-11 MED ORDER — HYDROMORPHONE HCL PF 1 MG/ML IJ SOLN
INTRAMUSCULAR | Status: AC
  Administered 2013-03-12: 1 mg via INTRAVENOUS
  Filled 2013-03-11: qty 1

## 2013-03-11 MED ORDER — ACETAMINOPHEN 325 MG PO TABS
650.0000 mg | ORAL_TABLET | ORAL | Status: DC | PRN
  Administered 2013-03-14: 650 mg via ORAL
  Filled 2013-03-11: qty 2

## 2013-03-11 MED ORDER — OXYCODONE HCL 5 MG/5ML PO SOLN: 5.0000 mg | Freq: Once | ORAL | Status: DC | PRN

## 2013-03-11 MED ORDER — LOSARTAN POTASSIUM 50 MG PO TABS
50.0000 mg | ORAL_TABLET | Freq: Every day | ORAL | Status: DC
  Administered 2013-03-12: 50 mg via ORAL
  Filled 2013-03-11: qty 1

## 2013-03-11 MED ORDER — DEXMEDETOMIDINE HCL IN NACL 200 MCG/50ML IV SOLN
0.4000 ug/kg/h | INTRAVENOUS | Status: DC
  Filled 2013-03-11: qty 50

## 2013-03-11 MED ORDER — VECURONIUM BROMIDE 10 MG IV SOLR
INTRAVENOUS | Status: DC | PRN
  Administered 2013-03-11: 3 mg via INTRAVENOUS
  Administered 2013-03-11: 2 mg via INTRAVENOUS
  Administered 2013-03-11 (×2): 1 mg via INTRAVENOUS
  Administered 2013-03-11: 3 mg via INTRAVENOUS
  Administered 2013-03-11: 4 mg via INTRAVENOUS
  Administered 2013-03-11: 3 mg via INTRAVENOUS
  Administered 2013-03-11 (×3): 2 mg via INTRAVENOUS
  Administered 2013-03-11: 3 mg via INTRAVENOUS
  Administered 2013-03-11: 2 mg via INTRAVENOUS

## 2013-03-11 MED ORDER — BUPIVACAINE-EPINEPHRINE 0.5% -1:200000 IJ SOLN
INTRAMUSCULAR | Status: DC | PRN
  Administered 2013-03-11: 20 mL

## 2013-03-11 MED ORDER — ONDANSETRON HCL 4 MG/2ML IJ SOLN
4.0000 mg | INTRAMUSCULAR | Status: DC | PRN
  Administered 2013-03-12: 4 mg via INTRAVENOUS
  Filled 2013-03-11: qty 2

## 2013-03-11 MED ORDER — HYDROMORPHONE HCL PF 1 MG/ML IJ SOLN
0.2500 mg | INTRAMUSCULAR | Status: DC | PRN
  Administered 2013-03-11 (×4): 0.5 mg via INTRAVENOUS

## 2013-03-11 MED ORDER — 0.9 % SODIUM CHLORIDE (POUR BTL) OPTIME
TOPICAL | Status: DC | PRN
  Administered 2013-03-11: 1000 mL

## 2013-03-11 MED ORDER — PANTOPRAZOLE SODIUM 40 MG PO TBEC
80.0000 mg | DELAYED_RELEASE_TABLET | Freq: Every day | ORAL | Status: DC
  Administered 2013-03-12 – 2013-03-15 (×5): 80 mg via ORAL
  Filled 2013-03-11 (×5): qty 2

## 2013-03-11 MED ORDER — FENTANYL CITRATE 0.05 MG/ML IJ SOLN
INTRAMUSCULAR | Status: DC | PRN
  Administered 2013-03-11 (×3): 50 ug via INTRAVENOUS
  Administered 2013-03-11: 100 ug via INTRAVENOUS
  Administered 2013-03-11: 50 ug via INTRAVENOUS
  Administered 2013-03-11: 100 ug via INTRAVENOUS
  Administered 2013-03-11: 50 ug via INTRAVENOUS
  Administered 2013-03-11: 150 ug via INTRAVENOUS
  Administered 2013-03-11 (×13): 50 ug via INTRAVENOUS

## 2013-03-11 MED ORDER — MIDAZOLAM HCL 2 MG/2ML IJ SOLN
INTRAMUSCULAR | Status: AC
  Filled 2013-03-11: qty 2

## 2013-03-11 MED ORDER — DOCUSATE SODIUM 100 MG PO CAPS
100.0000 mg | ORAL_CAPSULE | Freq: Two times a day (BID) | ORAL | Status: DC
  Administered 2013-03-12 – 2013-03-17 (×6): 100 mg via ORAL
  Filled 2013-03-11 (×12): qty 1

## 2013-03-11 MED ORDER — METHOCARBAMOL 500 MG PO TABS
500.0000 mg | ORAL_TABLET | Freq: Four times a day (QID) | ORAL | Status: DC | PRN
  Administered 2013-03-14 – 2013-03-16 (×4): 500 mg via ORAL
  Filled 2013-03-11 (×5): qty 1

## 2013-03-11 MED ORDER — ONDANSETRON HCL 4 MG/2ML IJ SOLN: 4.0000 mg | Freq: Once | INTRAMUSCULAR | Status: DC | PRN

## 2013-03-11 MED ORDER — ACETAMINOPHEN 650 MG RE SUPP: 650.0000 mg | RECTAL | Status: DC | PRN

## 2013-03-11 MED ORDER — DEXMEDETOMIDINE HCL IN NACL 400 MCG/100ML IV SOLN
0.4000 ug/kg/h | INTRAVENOUS | Status: AC
  Administered 2013-03-11: 0.5 ug/kg/h via INTRAVENOUS

## 2013-03-11 MED ORDER — OXYCODONE HCL 5 MG PO TABS
ORAL_TABLET | ORAL | Status: AC
  Filled 2013-03-11: qty 3

## 2013-03-11 MED ORDER — AMLODIPINE BESYLATE 10 MG PO TABS
10.0000 mg | ORAL_TABLET | Freq: Every day | ORAL | Status: DC
  Administered 2013-03-12 – 2013-03-13 (×2): 10 mg via ORAL
  Filled 2013-03-11 (×2): qty 1

## 2013-03-11 MED ORDER — ALBUMIN HUMAN 5 % IV SOLN
INTRAVENOUS | Status: DC | PRN
  Administered 2013-03-11 (×2): via INTRAVENOUS

## 2013-03-11 MED ORDER — HYDROMORPHONE HCL PF 1 MG/ML IJ SOLN
0.5000 mg | INTRAMUSCULAR | Status: DC | PRN
  Administered 2013-03-11 – 2013-03-14 (×19): 1 mg via INTRAVENOUS
  Filled 2013-03-11 (×18): qty 1

## 2013-03-11 MED ORDER — METHOCARBAMOL 100 MG/ML IJ SOLN
500.0000 mg | Freq: Four times a day (QID) | INTRAMUSCULAR | Status: DC | PRN
  Filled 2013-03-11: qty 5

## 2013-03-11 MED ORDER — STERILE WATER FOR INJECTION IJ SOLN
INTRAMUSCULAR | Status: AC
  Filled 2013-03-11: qty 10

## 2013-03-11 MED ORDER — BISACODYL 10 MG RE SUPP: 10.0000 mg | Freq: Every day | RECTAL | Status: DC | PRN

## 2013-03-11 MED ORDER — CROMOLYN SODIUM 5.2 MG/ACT NA AERS
1.0000 | INHALATION_SPRAY | Freq: Three times a day (TID) | NASAL | Status: DC | PRN
  Filled 2013-03-11 (×2): qty 13

## 2013-03-11 MED ORDER — OXYCODONE HCL 5 MG PO TABS
15.0000 mg | ORAL_TABLET | ORAL | Status: DC | PRN
  Administered 2013-03-11: 15 mg via ORAL

## 2013-03-11 MED ORDER — FENTANYL CITRATE 0.05 MG/ML IJ SOLN
INTRAMUSCULAR | Status: AC
  Filled 2013-03-11: qty 5

## 2013-03-11 MED ORDER — PHENYLEPHRINE HCL 10 MG/ML IJ SOLN
10.0000 mg | INTRAVENOUS | Status: DC | PRN
  Administered 2013-03-11: 20 ug/min via INTRAVENOUS

## 2013-03-11 MED ORDER — ATORVASTATIN CALCIUM 40 MG PO TABS
40.0000 mg | ORAL_TABLET | Freq: Every day | ORAL | Status: DC
Start: 2013-03-12 — End: 2013-03-17
  Administered 2013-03-12 – 2013-03-17 (×6): 40 mg via ORAL
  Filled 2013-03-11 (×7): qty 1

## 2013-03-11 MED ORDER — GELATIN ABSORBABLE MT POWD
OROMUCOSAL | Status: DC | PRN
  Administered 2013-03-11: 09:00:00 via TOPICAL

## 2013-03-11 MED ORDER — CEFAZOLIN SODIUM 1-5 GM-% IV SOLN
1.0000 g | Freq: Three times a day (TID) | INTRAVENOUS | Status: AC
  Administered 2013-03-11 – 2013-03-12 (×2): 1 g via INTRAVENOUS
  Filled 2013-03-11 (×2): qty 50

## 2013-03-11 MED ORDER — FLEET ENEMA 7-19 GM/118ML RE ENEM
1.0000 | ENEMA | Freq: Once | RECTAL | Status: AC | PRN
  Filled 2013-03-11: qty 1

## 2013-03-11 MED ORDER — ROCURONIUM BROMIDE 100 MG/10ML IV SOLN
INTRAVENOUS | Status: DC | PRN
  Administered 2013-03-11: 50 mg via INTRAVENOUS

## 2013-03-11 MED ORDER — CEFAZOLIN SODIUM 1-5 GM-% IV SOLN
1.0000 g | INTRAVENOUS | Status: DC
  Filled 2013-03-11: qty 50

## 2013-03-11 MED ORDER — MENTHOL 3 MG MT LOZG: 1.0000 | LOZENGE | OROMUCOSAL | Status: DC | PRN

## 2013-03-11 MED ORDER — PROPOFOL 10 MG/ML IV BOLUS
INTRAVENOUS | Status: AC
  Filled 2013-03-11: qty 20

## 2013-03-11 MED ORDER — SODIUM CHLORIDE 0.9 % IV SOLN
INTRAVENOUS | Status: DC | PRN
  Administered 2013-03-11: 15:00:00 via INTRAVENOUS

## 2013-03-11 MED ORDER — SODIUM CHLORIDE 0.9 % IJ SOLN
3.0000 mL | Freq: Two times a day (BID) | INTRAMUSCULAR | Status: DC
  Administered 2013-03-12 – 2013-03-17 (×10): 3 mL via INTRAVENOUS

## 2013-03-11 MED ORDER — LACTATED RINGERS IV SOLN
INTRAVENOUS | Status: DC | PRN
  Administered 2013-03-11 (×3): via INTRAVENOUS

## 2013-03-11 MED ORDER — ROCURONIUM BROMIDE 50 MG/5ML IV SOLN
INTRAVENOUS | Status: AC
  Filled 2013-03-11: qty 1

## 2013-03-11 MED ORDER — SODIUM CHLORIDE 0.9 % IV SOLN: 250.0000 mL | INTRAVENOUS | Status: DC

## 2013-03-11 MED ORDER — VECURONIUM BROMIDE 10 MG IV SOLR
INTRAVENOUS | Status: AC
  Filled 2013-03-11: qty 10

## 2013-03-11 MED ORDER — SODIUM CHLORIDE 0.9 % IJ SOLN: 3.0000 mL | INTRAMUSCULAR | Status: DC | PRN

## 2013-03-11 MED ORDER — THROMBIN 20000 UNITS EX SOLR
CUTANEOUS | Status: AC
  Filled 2013-03-11: qty 20000

## 2013-03-11 MED ORDER — LACTATED RINGERS IV SOLN
INTRAVENOUS | Status: DC | PRN
  Administered 2013-03-11 (×2): via INTRAVENOUS

## 2013-03-11 MED ORDER — KETOROLAC TROMETHAMINE 30 MG/ML IJ SOLN
30.0000 mg | Freq: Four times a day (QID) | INTRAMUSCULAR | Status: DC
  Administered 2013-03-12: 30 mg via INTRAVENOUS
  Filled 2013-03-11 (×3): qty 1

## 2013-03-11 MED ORDER — GLYCOPYRROLATE 0.2 MG/ML IJ SOLN
INTRAMUSCULAR | Status: DC | PRN
  Administered 2013-03-11: .8 mg via INTRAVENOUS

## 2013-03-11 MED ORDER — PROPOFOL 10 MG/ML IV BOLUS
INTRAVENOUS | Status: DC | PRN
  Administered 2013-03-11: 200 mg via INTRAVENOUS

## 2013-03-11 MED ORDER — OXYCODONE HCL 5 MG PO TABS
15.0000 mg | ORAL_TABLET | ORAL | Status: DC | PRN
  Administered 2013-03-13: 15 mg via ORAL
  Administered 2013-03-14: 5 mg via ORAL
  Administered 2013-03-14: 15 mg via ORAL
  Administered 2013-03-14: 10 mg via ORAL
  Administered 2013-03-15 – 2013-03-17 (×10): 15 mg via ORAL
  Filled 2013-03-11 (×14): qty 3

## 2013-03-11 MED ORDER — NEOSTIGMINE METHYLSULFATE 1 MG/ML IJ SOLN
INTRAMUSCULAR | Status: DC | PRN
  Administered 2013-03-11: 5 mg via INTRAVENOUS

## 2013-03-11 MED ORDER — SUCCINYLCHOLINE CHLORIDE 20 MG/ML IJ SOLN
INTRAMUSCULAR | Status: AC
  Filled 2013-03-11: qty 1

## 2013-03-11 MED ORDER — SUCCINYLCHOLINE CHLORIDE 20 MG/ML IJ SOLN
INTRAMUSCULAR | Status: DC | PRN
  Administered 2013-03-11: 200 mg via INTRAVENOUS

## 2013-03-11 MED ORDER — ARTIFICIAL TEARS OP OINT
TOPICAL_OINTMENT | OPHTHALMIC | Status: DC | PRN
  Administered 2013-03-11: 1 via OPHTHALMIC

## 2013-03-11 MED ORDER — OXYCODONE HCL 5 MG PO TABS: 5.0000 mg | ORAL_TABLET | Freq: Once | ORAL | Status: DC | PRN

## 2013-03-11 MED ORDER — LIDOCAINE HCL (CARDIAC) 20 MG/ML IV SOLN
INTRAVENOUS | Status: AC
  Filled 2013-03-11: qty 5

## 2013-03-11 MED ORDER — ALLOPURINOL 100 MG PO TABS
100.0000 mg | ORAL_TABLET | Freq: Two times a day (BID) | ORAL | Status: DC
  Administered 2013-03-12: 100 mg via ORAL
  Filled 2013-03-11 (×3): qty 1

## 2013-03-11 MED ORDER — PHENOL 1.4 % MT LIQD: 1.0000 | OROMUCOSAL | Status: DC | PRN

## 2013-03-11 MED ORDER — SERTRALINE HCL 25 MG PO TABS
25.0000 mg | ORAL_TABLET | Freq: Every day | ORAL | Status: DC
  Administered 2013-03-12 – 2013-03-17 (×6): 25 mg via ORAL
  Filled 2013-03-11 (×7): qty 1

## 2013-03-11 MED ORDER — POTASSIUM CHLORIDE IN NACL 20-0.45 MEQ/L-% IV SOLN
INTRAVENOUS | Status: DC
  Administered 2013-03-11: 21:00:00 via INTRAVENOUS
  Filled 2013-03-11 (×3): qty 1000

## 2013-03-11 MED ORDER — HYDROMORPHONE HCL PF 1 MG/ML IJ SOLN
INTRAMUSCULAR | Status: AC
  Filled 2013-03-11: qty 3

## 2013-03-11 MED ORDER — MIDAZOLAM HCL 5 MG/5ML IJ SOLN
INTRAMUSCULAR | Status: DC | PRN
  Administered 2013-03-11 (×2): 1 mg via INTRAVENOUS

## 2013-03-11 MED ORDER — POLYETHYLENE GLYCOL 3350 17 G PO PACK
17.0000 g | PACK | Freq: Every day | ORAL | Status: DC | PRN
  Filled 2013-03-11: qty 1

## 2013-03-11 MED ORDER — PHENYLEPHRINE HCL 10 MG/ML IJ SOLN
INTRAMUSCULAR | Status: DC | PRN
  Administered 2013-03-11 (×4): 80 ug via INTRAVENOUS
  Administered 2013-03-11: 120 ug via INTRAVENOUS

## 2013-03-11 MED ORDER — BUPIVACAINE-EPINEPHRINE (PF) 0.5% -1:200000 IJ SOLN
INTRAMUSCULAR | Status: AC
  Filled 2013-03-11: qty 10

## 2013-03-11 MED ORDER — EPHEDRINE SULFATE 50 MG/ML IJ SOLN
INTRAMUSCULAR | Status: AC
  Filled 2013-03-11: qty 1

## 2013-03-11 SURGICAL SUPPLY — 67 items
BLADE SURG ROTATE 9660 (MISCELLANEOUS) ×6 IMPLANT
BUR ROUND FLUTED 4 SOFT TCH (BURR) ×2 IMPLANT
BUR ROUND FLUTED 4MM SOFT TCH (BURR) ×1
CAGE BULLET CONCORDE 9X10X27 (Cage) ×2 IMPLANT
CAGE BULLET CONCORDE 9X10X27MM (Cage) ×1 IMPLANT
CAGE CONCORDE BULLET 9X11X27 (Cage) ×2 IMPLANT
CAGE CONCORDE BULLET 9X11X27MM (Cage) ×2 IMPLANT
CAGE SPNL 5D BLT NOSE 27X9X11 (Cage) ×2 IMPLANT
CLOTH BEACON ORANGE TIMEOUT ST (SAFETY) IMPLANT
CONNECTOR SFX SIZE A6 (Orthopedic Implant) ×3 IMPLANT
CORDS BIPOLAR (ELECTRODE) ×3 IMPLANT
COVER MAYO STAND STRL (DRAPES) ×6 IMPLANT
COVER SURGICAL LIGHT HANDLE (MISCELLANEOUS) ×3 IMPLANT
DERMABOND ADVANCED (GAUZE/BANDAGES/DRESSINGS) ×2
DERMABOND ADVANCED .7 DNX12 (GAUZE/BANDAGES/DRESSINGS) ×1 IMPLANT
DRAPE C-ARM 42X72 X-RAY (DRAPES) ×3 IMPLANT
DRAPE SURG 17X23 STRL (DRAPES) ×9 IMPLANT
DRAPE TABLE COVER HEAVY DUTY (DRAPES) ×3 IMPLANT
DRSG MEPILEX BORDER 4X4 (GAUZE/BANDAGES/DRESSINGS) IMPLANT
DRSG MEPILEX BORDER 4X8 (GAUZE/BANDAGES/DRESSINGS) IMPLANT
DURAPREP 26ML APPLICATOR (WOUND CARE) ×3 IMPLANT
ELECT BLADE 6.5 EXT (BLADE) ×3 IMPLANT
ELECT CAUTERY BLADE 6.4 (BLADE) ×3 IMPLANT
ELECT REM PT RETURN 9FT ADLT (ELECTROSURGICAL) ×3
ELECTRODE REM PT RTRN 9FT ADLT (ELECTROSURGICAL) ×1 IMPLANT
EVACUATOR 1/8 PVC DRAIN (DRAIN) ×3 IMPLANT
GLOVE BIOGEL PI IND STRL 7.5 (GLOVE) ×1 IMPLANT
GLOVE BIOGEL PI INDICATOR 7.5 (GLOVE) ×2
GLOVE ECLIPSE 7.0 STRL STRAW (GLOVE) ×3 IMPLANT
GLOVE ECLIPSE 8.5 STRL (GLOVE) ×3 IMPLANT
GLOVE SURG 8.5 LATEX PF (GLOVE) ×3 IMPLANT
GOWN PREVENTION PLUS LG XLONG (DISPOSABLE) IMPLANT
GOWN STRL NON-REIN LRG LVL3 (GOWN DISPOSABLE) ×6 IMPLANT
GOWN STRL REUS W/TWL 2XL LVL3 (GOWN DISPOSABLE) ×3 IMPLANT
HEMOSTAT SURGICEL 2X14 (HEMOSTASIS) IMPLANT
KIT BASIN OR (CUSTOM PROCEDURE TRAY) ×3 IMPLANT
KIT POSITION SURG JACKSON T1 (MISCELLANEOUS) ×3 IMPLANT
KIT ROOM TURNOVER OR (KITS) ×3 IMPLANT
NEEDLE 22X1 1/2 (OR ONLY) (NEEDLE) ×3 IMPLANT
NEEDLE ASP BONE MRW 11GX15 (NEEDLE) IMPLANT
NEEDLE BONE MARROW 8GAX6 (NEEDLE) IMPLANT
NEEDLE SPNL 18GX3.5 QUINCKE PK (NEEDLE) ×6 IMPLANT
NS IRRIG 1000ML POUR BTL (IV SOLUTION) ×3 IMPLANT
PACK LAMINECTOMY ORTHO (CUSTOM PROCEDURE TRAY) ×3 IMPLANT
PAD ARMBOARD 7.5X6 YLW CONV (MISCELLANEOUS) ×6 IMPLANT
PATTIES SURGICAL .75X.75 (GAUZE/BANDAGES/DRESSINGS) ×3 IMPLANT
PATTIES SURGICAL 1X1 (DISPOSABLE) IMPLANT
ROD EXPEDIUM 6.35 300MM (Rod) ×3 IMPLANT
ROD PREBENT 6.35X95MM (Rod) ×3 IMPLANT
SCREW POLY EXP 6.35 7X40MM (Screw) ×18 IMPLANT
SCREW POLY EXP 6.35 7X45MM (Screw) ×6 IMPLANT
SCREW SET EXPEDIUM 6.35 (Screw) ×24 IMPLANT
SPONGE LAP 4X18 X RAY DECT (DISPOSABLE) ×21 IMPLANT
SUT VIC AB 0 CT1 27 (SUTURE) ×4
SUT VIC AB 0 CT1 27XBRD ANBCTR (SUTURE) ×2 IMPLANT
SUT VIC AB 1 CTX 36 (SUTURE) ×4
SUT VIC AB 1 CTX36XBRD ANBCTR (SUTURE) ×2 IMPLANT
SUT VIC AB 2-0 CT1 27 (SUTURE) ×4
SUT VIC AB 2-0 CT1 TAPERPNT 27 (SUTURE) ×2 IMPLANT
SUT VICRYL 0 CT 1 36IN (SUTURE) ×6 IMPLANT
SUT VICRYL 4-0 PS2 18IN ABS (SUTURE) ×6 IMPLANT
SYR CONTROL 10ML LL (SYRINGE) ×3 IMPLANT
TOWEL OR 17X24 6PK STRL BLUE (TOWEL DISPOSABLE) ×3 IMPLANT
TOWEL OR 17X26 10 PK STRL BLUE (TOWEL DISPOSABLE) ×3 IMPLANT
TRAY FOLEY CATH 16FRSI W/METER (SET/KITS/TRAYS/PACK) ×3 IMPLANT
WATER STERILE IRR 1000ML POUR (IV SOLUTION) ×3 IMPLANT
YANKAUER SUCT BULB TIP NO VENT (SUCTIONS) IMPLANT

## 2013-03-11 NOTE — Op Note (Signed)
03/11/2013  4:28 PM  PATIENT:  Phillip Rocha  53 y.o. 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,L4-5, Central herniated nucleus pulposus L5-S1  POST-OPERATIVE DIAGNOSIS:  Lumbar spinal stenosis L3-S1, degenerative sponylolisthesis L3-4, L4-5, central herniated nucleus pulposis L5-S1  PROCEDURE:  Procedure(s): Central laminectomy L3-4, L4-5,L5-S1, Transforaminal lumber interbody fusion left L3-4,L4-5, L5-S1 with pedicle screws and rods L3-S1 Local Bone Graft    SURGEON:  Jessy Oto, MD     ASSISTANT:  Phillips Hay, PA-C  (Present throughout the entire procedure and necessary for completion of procedure in a timely manner)     ANESTHESIA:  General, supplemented with local Marcaine 1/2% with 1/200,000 epi. 20 cc,  Dr. Roberts Gaudy.  EBL: 2500cc  Cell Saver Returned: 1170cc  DRAINS: Hemovac x 1 left lower lumbar. Foley to SD.    COMPLICATIONS:  None.     COMPONENTS:Depuy expedium screws and rods, L3-4: 50m x45 mm screws x 2, L4-5: 7 mm x40 mm screws x 2, L5-S1: 739mx 401mcrews x 2, Concorde bullet lordotic cages L3-4:9x11x27m58m4-5:9x10x27mm45m-S1 9x11x27mm.2mal bone graft.   PROCEDURE: The patient was met in the holding area, and the appropriate lumbar levels identified and marked with an "X" and my initials. I had discussion with the patient in the preop holding area regarding a change of consent form.The fusion levels are reidentified as  L3-4, L4-5 and L5-S1 with removal of hardware at the previous fusion site L5-S1. This means extension of the fusion upwards to the L2-3 (Stable vertebrae level)Patient understands the rationale to perform TLIFs at three levels to decompress the  L3-4, L4-5 centrally, L5-S1 lateral recesses and foramenal stenosis and to allow for some improved correction of his overall spondylolisthesis at L3-4 and L4-5. The L2-3 disc is normal on MRI and correction  of the deformity from L3-S1 should improve his sagittal allignment.  The patient was then transported to OR and was placed under general anestheticwithout difficulty. The patient received appropriate preoperative antibiotic prophylaxis Ancef 3 grams.  Nursing staff inserted a Foley catheter under sterile conditions. The patient was then turned to a prone position using the [place] frame. PAS. all pressure points well padded the arms at the side to 90 90. Standard prep with DuraPrep solution draped in the usual manner from the lower dorsal spine the mid sacral segment. Iodine Vi-Drape was used and the old incision scar was marked. Time-out procedure was called and correct. Skin in the midline between L1 and S2 was then infiltrated with Marcaine half percent with 1-200,000 epinephrine total of 20 cc used. Incision was then made extending from L1-S2 through the skin and subcutaneous layers down to the patient's lumbodorsal fascia and spinous processes. The incision then carried sharply excising the supraspinous ligament and then continuing the lateral aspect of the spinous processes of L2-L3 L4-L5 L5-S1. Cobb elevator used to carefully elevate the paralumbar muscles off of the posterior elements using electrocautery carefully drilled bleeding and perform dissection of the muscle tissues of the preserving the facet at the L2-3. Continuing the exposure out laterally to expose the transverse process of  L3, L4, L5 and the sacrum in the midline and out to the sacra ala bilaterally, incision was carried in the midline down to the S1  level area bleeders controlled using electrocautery monopolar electrocautery. Clamps were then placed at the L3-4 and L4-5 level and observed on lateral radiograph the upper clamp was beneath the L3  spinous process. Spinous processes of L3, L4 and L5 marked with  OR marking pen sterilely. Viper retractor was used for the upper part of the incision and the cerebellar retractor distally.  Spinous processes of inferior 50% L3, all of  L4 and all of L5, were then resected down to the base the lamina at each segment the lower 50% of the spinous process of L3 was resected and Leksell rongeur used to resect inferior aspect of the lamina on the left side at the L3 level and partially on the right side at L3. The left  facet of  L3-4, L4-5 and L5-S1 were resected in order to decompress the left side of the lumbar thecal sac at L2.3, L:3-4 and L4-5 and decompress the left L3,L4, L5 and S1 neuroforamen for ease of placement of TLIFs (transforaminal lumbar interbody fusion) at each level inferior portions of the lamina and pars were also resected first beginning with the Leksell rongeur and osteotomes and then resecting using 2 and 3 mm Kerrison. Osteotomes and 29m and 392mkerrisons were used for this portion of the decompression. Similarly the right side decompression was carried out but complete facetectomies were perform on the right at L3-4, L4-5 and L5-S1 to provide for decompression right side L3-4, L4-5 and L5-S1 neuroforamen. The inferior articular process  L3, L4 and L5 were resected on the left side. The L5 nerve root identified bilaterally and the medial aspect of the L5 pedicle. Superior articular process of S1 was then resected from the left side further decompressing the left L5 nerve and providing for exposure of the area just superior to the S1pedicle for a placement of cage. A large amount of hypertrophic ligmentum flavum was found impressing on the right and left lateral recesses at L3-4, L4-5 and L5-S1 narrowing the respective L3 and L4 neuroforamen. Loupe magnification and headlight were used during this portion procedure .Plenty of soft tissue overlying the dura was present.  C-arm fluoroscopy was then brought into the field and using C-arm fluoroscopy then a hole made into the lateral aspect of the pedicle of left L3 observed in the pedicle using ball-tipped nerve hook and hockey stick  nerve probe initial entry was determined on fluoroscopy to be good position alignment so that a ball handled probe was then used to probe the left L3 pedicle to a depth of nearly 45 mm observed on C-arm fluoroscopy to be beyond the midpoint of the lumbar vertebra and then position alignment within the left L3 pedicle this was then removed and the pedicle channel probed demonstrating patency no sign of rupture the cortex of the pedicle. Tapping with a 6.0 mm tap then 7.0 mm x 45 mm screw was placed on the left side at the L3 level. C-arm fluoroscopy was then brought into the field and using C-arm fluoroscopy then a hole made into the lateral aspect of the pedicle of L4 observed in the pedicle using ball tipped nerve hook and hockey stick nerve probe initial entry was determined on fluoroscopy to be good position alignment so that a ball handled probe was then used to probe the left L4 pedicle to a depth of nearly 45 mm observed on C-arm fluoroscopy to be beyond the midpoint of the lumbar vertebra and then position alignment within the left L4 pedicle this was then removed and the pedicle channel probed demonstrating patency no sign of rupture the cortex of the pedicle. Tapping with a 6.0 mm tap then 7.0 mm x 40 mm screw was  placed on the left side at the L4 level C-arm fluoroscopy was then brought into the field and using C-arm fluoroscopy then a hole made into the lateral aspect of the pedicle of L4 observed in the pedicle using ball tipped nerve hook and hockey stick nerve probe initial entry was determined on fluoroscopy to be good position alignment so that a ball handled probe was then used to probe the left L5 pedicle to a depth of nearly 40 mm observed on C-arm fluoroscopy to be beyond the midpoint of the lumbar vertebra and then position alignment within the left L5 pedicle this was then removed and the pedicle channel probed demonstrating patency no sign of rupture the cortex of the pedicle. Tapping with a 6  mm tap then 7.0 mm x 40 mm screw was placed on the left side at the L5 level. The pedicle channel of S1 on the left probed demonstrating patency no sign of rupture the cortex of the pedicle. Screw for fixation of this level was measured as 7.0 mm x 70m screw so a 7.036mx 40 mm was placed on the left side at the S1 level. C-arm fluoroscopy was then brought into the field and using C-arm fluoroscopy then the hole into the pedicle of S1 observed with ball-tipped probe S1 pedicle screw  7.0 mm x 4079mas placed on the left side at the S1 level. Attention then turned to the right side and changing to the sides of the table, the right-sided pedicle screws were passed in a similar fashion. C-arm fluoroscopy was used to localize the hole made in the lateral aspect of the pedicle of L3 on the right localizing the pedicle within the spinal canal with nerve hook and hockey-stick nerve probe carefully passed down the center of the L3 pedicle to a depth of nearly 45 mm. Observed on C-arm fluoroscopy to be in good position alignment channel was probed with a ball-tipped probe ensure patency no sign of cortical disruption. Following tapping with a 6 mm  tap and a 7.0 x 45 mm screw was placed on the right side pedicle at L3. C-arm fluoroscopy was used to localize the hole made in the lateral aspect of the pedicle of L4 on the right localizing the pedicle within the spinal canal with nerve hook and hockey-stick nerve probe carefully passed down the center of the L4 pedicle to a depth of nearly 40 mm. Observed on C-arm fluoroscopy to be in good position alignment channel was probed with a ball-tipped probe ensure patency no sign of cortical disruption. Following tapping with a 6 mm tap and a 7.0 x 40 mm screw was placed on the right side pedicle at L4. C-arm fluoroscopy was used to localize the hole made in the lateral aspect of the pedicle of L5 on the right localizing the pedicle within the spinal canal with nerve hook and  hockey-stick nerve probe carefully passed down the center of the L5 pedicle to a depth of nearly 40 mm. Observed on C-arm fluoroscopy to be in good position alignment channel was probed with a ball-tipped probe ensure patency no sign of cortical disruption. Following tapping with 6 mm tap a 7.0 x 40 mm screw was placed on the right side pedicle at L5. Attempts were made to place a pedicle screw on the right side at the S1 level  this pedicle was  instrumented.C-arm fluoroscopy was used to localize the hole made in the lateral aspect of the pedicle of S1 on the right  localizing the pedicle with a ball tipped probe carefully passed down the center of the S1 pedicle to a depth of nearly 40 mm. Observed on C-arm fluoroscopy to be in good position alignment channel was probed with a ball-tipped probe ensure patency no sign of cortical disruption.A right S1 pedicle screw measured 7.13m x 4354mwas placed on the right side pedicle at S1. Attention then turned to placement of the transforaminal lumbar interbody fusion cages. Using a Penfield 4 the leftt lateral aspect of the thecal sac at the L3-4 disc space was carefully freed up The thecal sac could then easily be retracted in the posterior lateral aspect of the L3-4 disc was exposed 15 blade scalpel used to incise the posterolateral disc and an osteotome used to resect a small portion of bone off the superior aspect of the posterior superior vertebral body of L4 in order to ease the entry into the L3-4 disc space. A  54m554merrison rongeur was then able to be introduced in the disc space debrided it was quite narrow. 8-11 mm dilators were used to dialate the L3-4 disc space on the leftt side attempts were made to dilate further the 11 mm were successful and using small curettes and the disc space was debrided a minimal degenerative disc present in the endplates debrided to bleeding endplate bone. A 11 mm x 92m43mrdotic Concorde cage was carefully packed with morcellized  bone graft and the been harvested from previous laminotomies additional bone graft was then packed into the intervertebral disc space using the 8 mm trial  packed the graft multiple times. With this then a 11 mm x 92mm54mdotic cage was able to be introduced into the disc space on the leftt side using the correct degree convergence and then impacted then subset beneath the outer aspect of the disc space by about 3 or 4 mm. Bleeding controlled using bipolar electrocautery thrombin soaked gel cottonoids. Then turned to the left L4-5 level similarly the exposure the posterior lateral aspect this was carried out using a Penfield 4 bipolar electrocautery to control small bleeders present. Derricho retractor used to retract the thecal sac and L4 nerve root a 15 blade scalpel was used to incise posterior lateral aspect of the disc the disc space at this level showed a rather severe narrowing posteriorly was more open anteriorly so that an osteotome again was used to resect a small portion the posterior superior lip of the vertebral body at L5  in order to gain ease of access into the L4-5 disc space. The space was debrided of degenerative disc material using pituitary along root the entire disc space was then debrided of degenerative disc material using pituitary rongeurs curettage down to bleeding bone endplates. Residual disc was resected using pituitary. This space was then carefully assess using spacers  a 11mm 654mmm c90mprovided the best fit the lordotic cage was chosen the intervertebral disc space was then packed with autogenous local bone graft that been harvested from the central laminectomy and this was pack multiple times and impacted with 8 mm trial cage apparatus. This provided excellent bone graft within the intervertebral disc space at L4-5 so that the permanent 10 mm lordotic cage by 27 mm was then packed with local bone graft placed into the intervertebral disc space and impacted into place in the  correct degree of convergence. Bleeding controlled using bipolar electrocautery. The cage was subset beneath the posterior aspect of the L4-5 disc space by about 3-4 mm attempting  to open the disc space on the right side than as with the L4-5 level. Bleeding hemostasis then attention was turned to the left side at the L5-S1 level the leftt side portion of the disc was excised on the left side then carefully retracting the thecal sac and the appropriate nerve L5 root  pituitary rongeurs were used to debride this cavity L5-S1 level the disc space wide enough to accept instruments to debride the intervertebral disc space. At the L5-S1 level the disc was able to accept a 10 mm spreader was able to be introduced and then an 10m spreader. 193mx 2742mage was chosen lordotic type. A vertebral disc space was debrided of degenerative disc material and endplate and a curet and pituitary rongeurs. This was completely local bone graft was packed into the disc space multiple time using the smaller 8 mm trial to impact the graft. Finally the 11 mm x 27 mm lordotic Concorde cage was packed with local bone graft and this was then impacted into the disc space on the left side at L5-S1. The cage was placed on the left side.Cage was subset beneath the posterior aspect of the disc space 2 or 3 mm. Observed on C-arm fluoroscopy to be in good position alignment. The cages at L3-4, L4-5 and L5-S1 were placed anteriorly as best as possible the correct patient's kyphosis that was present. With this then the transforaminal lumbar interbody fusion portion of the case was completed bleeders were controlled using bipolar electrocautery thrombin-soaked Gelfoam were appropriate.4 screws on the left and 4 screws on the left and then each carefully aligned and tightened or loose and a slight amount to allow for placement of rods. Template was made along the left side first quarter inch titanium rod was then carefully contoured used using the  template and divided appropriately. This was then placed into the pedicle screws on the right extending from L3-S1 each of the caps placed and carefully placed loosely tightened. Attention turned to the right side were similarly and then screws were carefully adjusted to allow for a better pattern screws to allow for placement of fixation rod template for the rod was then taken and a quarter inch titanium rod was then carefully contoured. This was able to be inserted into the right pedicle screw fasteners and then using a persuader to place caps onto the right L4 fasteners the upper 2 pedicle screw caps appeared to fit quite well. The right S1 rod fastener cap was then tightened 85 pounds then on the right side disc space at L4-5 and L3-4 and L2-3 screw fasteners compression was obtained on the right side between L4 and S1, L3 andL4, and L2 and L3 by compressing between the fasteners right hand side and tightening the screw caps 85 pounds. Similarly this was done on the left side at L5-S1, L4-5, L3-4 and L2-3 screws using slightl compression and tightened 85 pounds. Iirrigation was carried out with copious amounts of saline solution this was done throughout the case. Cell Saver was used during the case. 1170 cc of blood was return to the patient. A single cross-link for the Depuy expedium system was placed at the L4-5 level measured with the measuring tool and the appropriate A6 cross-link was then carefully contoured and applied to the rods and tightened again to 80 foot-pounds bilaterally using the appropriate torque screwdriver. Center screw on the cross-link was then carefully tightened 80 foot-pounds irrigation was carried out observation areas of dural tear demonstrated no leakage present.Permanent  C-arm images were obtained in AP, lateral and oblique planes. Remaining local bone graft was then applied along the left lateral posterior lateral region extending from L3 through S1 transverse processes. A medium  hemovac drain was placed exiting out the left lower lumbar area.  Excess Gelfoam was then removed the lumbodorsal musculature carefully exam debrided of any devitalized tissue following removal of Vicryl retractors were the bleeders were controlled using electrocautery and the area dorsal lumbar muscle were then approximated in the midline with interrupted #1 Vicryl sutures loose the dorsal fascia was reattached to the spinous process of L1 to superiorly and S1 inferiorly this was done with #1 Vicryl sutures. Subcutaneous layers then approximated using interrupted 0 Vicryl sutures and 2-0 Vicryl sutures. Skin was closed with a running subcutaneous stitch of 4-0 Vicryl Dermabond was applied then MedPlex bandage. All instrument and sponge counts were correct. The patient was then returned to a supine position on her bed reactivated extubated and returned to the recovery room in satisfactory condition.   Phillips Hay PA-C perform the duties of assistant surgeon during this case. She was present from the beginning of the case to the end of the case assisting in transfer the patient from his stretcher to the OR table and back to the stretcher at the end of the case. Assisted in careful retraction and suction of the laminectomy site delicate neural structures operating under the operating room microscope. She performed closure of the incision from the fascia to the skin applying the dressing.           NITKA,JAMES E  03/11/2013, 4:28 PM

## 2013-03-11 NOTE — Anesthesia Procedure Notes (Signed)
Procedure Name: Intubation Date/Time: 03/11/2013 7:45 AM Performed by: Erik Obey Pre-anesthesia Checklist: Patient identified, Timeout performed, Emergency Drugs available, Suction available and Patient being monitored Patient Re-evaluated:Patient Re-evaluated prior to inductionOxygen Delivery Method: Circle system utilized Preoxygenation: Pre-oxygenation with 100% oxygen Intubation Type: IV induction Laryngoscope Size: Mac and 4 Grade View: Grade II Tube type: Oral Tube size: 7.5 mm Number of attempts: 1 Airway Equipment and Method: Stylet Placement Confirmation: ETT inserted through vocal cords under direct vision,  positive ETCO2 and breath sounds checked- equal and bilateral Secured at: 22 cm Tube secured with: Tape Dental Injury: Teeth and Oropharynx as per pre-operative assessment

## 2013-03-11 NOTE — Interval H&P Note (Signed)
History and Physical Interval Note:  03/11/2013 7:21 AM  Phillip Rocha  has presented today for surgery, with the diagnosis of Lumbar spinal stenosis L3-S1, Degenerative spondylolisthesis L3-4,L4-5, Central herniated nucleus pulposus L5-S1  The various methods of treatment have been discussed with the patient and family. After consideration of risks, benefits and other options for treatment, the patient has consented to  Procedure(s): Central laminectomy L3-4, L4-5, Bilateral hemi-laminectomy L5-S1, Transforaminal lumber interbody fusion left L3-4,L4-5, L5-S1 with pedicle screws and rods L3-S1 Local Bone Graft, Conform DBX (N/A) as a surgical intervention .  The patient's history has been reviewed, patient examined, no change in status, stable for surgery.  I have reviewed the patient's chart and labs.  Questions were answered to the patient's satisfaction.     Bonnita Newby E

## 2013-03-11 NOTE — Anesthesia Postprocedure Evaluation (Signed)
  Anesthesia Post-op Note  Patient: Phillip Rocha  Procedure(s) Performed: Procedure(s): Central laminectomy L3-4, L4-5, Bilateral hemi-laminectomy L5-S1, Transforaminal lumber interbody fusion left L3-4,L4-5, L5-S1 with pedicle screws and rods L3-S1 Local Bone Graft, Conform DBX (N/A)  Patient Location: PACU  Anesthesia Type:General  Level of Consciousness: awake, alert  and oriented  Airway and Oxygen Therapy: Patient Spontanous Breathing and Patient connected to nasal cannula oxygen  Post-op Pain: mild  Post-op Assessment: Post-op Vital signs reviewed, Patient's Cardiovascular Status Stable, Respiratory Function Stable, Patent Airway and No signs of Nausea or vomiting  Post-op Vital Signs: stable  Complications: No apparent anesthesia complications

## 2013-03-11 NOTE — Progress Notes (Signed)
Pt. Was taken off BIPAP by RN & placed on 3L [place] due to increased anxiety on BIPAP. Pt. Is doing well at this time & isn't in any distress & is able to answer questions appropriately. Pt.'s vitals are as follows: HR: 108, RR: 20, SAT's: 98% on 3L [place], BP: 91/72. BIPAP is at the pt.'s bedside on standby.

## 2013-03-11 NOTE — Brief Op Note (Signed)
03/11/2013  4:21 PM  PATIENT:  Phillip Rocha  53 y.o. male  PRE-OPERATIVE DIAGNOSIS:  Lumbar spinal stenosis L3-S1, Degenerative spondylolisthesis L3-4,L4-5, Central herniated nucleus pulposus L5-S1  POST-OPERATIVE DIAGNOSIS:  Lumbar spinal stenosis L3-S1, degenerative sponylolisthesis L3-4, L4-5, central herniated nucleus pulposis L5-S1  PROCEDURE:  Procedure(s): Central laminectomy L3-4, L4-5, Bilateral hemi-laminectomy L5-S1, Transforaminal lumber interbody fusion left L3-4,L4-5, L5-S1 with pedicle screws and rods L3-S1 Local Bone Graft, Conform DBX (N/A)  SURGEON:  Surgeon(s) and Role:    * Jessy Oto, MD - Primary  PHYSICIAN ASSISTANT: Phillips Hay, PA-C   ANESTHESIA:   general  EBL:  Total I/O In: 5960 [I.V.:4300; Blood:1160; IV Piggyback:500] Out: 3025 [Urine:525; Blood:2500]  BLOOD ADMINISTERED:1170 CC CELLSAVER  DRAINS: (One) Hemovact drain(s) in the left lumbar with  Suction Open and Urinary Catheter (Foley)   LOCAL MEDICATIONS USED:  MARCAINE    and Amount: 20 ml  SPECIMEN:  No Specimen  DISPOSITION OF SPECIMEN:  N/A  COUNTS:  YES  TOURNIQUET:  * No tourniquets in log *  DICTATION: .Dragon Dictation  PLAN OF CARE: Admit to inpatient   PATIENT DISPOSITION:  PACU - hemodynamically stable.   Delay start of Pharmacological VTE agent (>24hrs) due to surgical blood loss or risk of bleeding: yes

## 2013-03-11 NOTE — Transfer of Care (Signed)
Immediate Anesthesia Transfer of Care Note  Patient: Phillip Rocha  Procedure(s) Performed: Procedure(s): Central laminectomy L3-4, L4-5, Bilateral hemi-laminectomy L5-S1, Transforaminal lumber interbody fusion left L3-4,L4-5, L5-S1 with pedicle screws and rods L3-S1 Local Bone Graft, Conform DBX (N/A)  Patient Location: PACU  Anesthesia Type:General  Level of Consciousness: awake, alert  and oriented  Airway & Oxygen Therapy: Patient Spontanous Breathing and Patient connected to face mask oxygen  Post-op Assessment: Report given to PACU RN, Post -op Vital signs reviewed and stable and Patient moving all extremities X 4  Post vital signs: Reviewed and stable  Complications: No apparent anesthesia complications

## 2013-03-11 NOTE — Progress Notes (Signed)
RT Note: Patient was placed on Bipap per Dr. Linna Caprice. Patient was placed on Ipap-16, Epap-8, Fio2-60%. Patient is on a full faced large mask and is tolerating it well. MD is at bedside. Patient is stable and RT will continue to monitor. Spo2-100%

## 2013-03-11 NOTE — Progress Notes (Signed)
cpap initiated by respiratory therapy per dr. Linna Caprice order

## 2013-03-11 NOTE — Preoperative (Signed)
Beta Blockers   Reason not to administer Beta Blockers:Not Applicable

## 2013-03-11 NOTE — Anesthesia Preprocedure Evaluation (Addendum)
Anesthesia Evaluation  Patient identified by MRN, date of birth, ID band Patient awake    Reviewed: Allergy & Precautions, H&P , NPO status , Patient's Chart, lab work & pertinent test results  Airway Mallampati: II TM Distance: >3 FB Neck ROM: Full    Dental  (+) Teeth Intact and Dental Advisory Given   Pulmonary shortness of breath and with exertion, sleep apnea ,  breath sounds clear to auscultation        Cardiovascular hypertension, Pt. on medications Rhythm:Regular Rate:Normal  Nuclear stress test 09/26/12: Results showed inferior defect consistent with diaphragmatic attenuation, small fixed mild anterior defect likely soft tissue attenuation, no ischemia, post stress EF 50%   Neuro/Psych PSYCHIATRIC DISORDERS Anxiety Depression negative neurological ROS     GI/Hepatic GERD-  Medicated and Controlled,  Endo/Other  diabetes, Type 2, Oral Hypoglycemic AgentsMorbid obesityGlu 137  Renal/GU      Musculoskeletal   Abdominal (+) + obese,   Peds  Hematology   Anesthesia Other Findings   Reproductive/Obstetrics                       Anesthesia Physical Anesthesia Plan  ASA: III  Anesthesia Plan: General   Post-op Pain Management:    Induction: Intravenous  Airway Management Planned: Oral ETT  Additional Equipment:   Intra-op Plan:   Post-operative Plan: Extubation in OR  Informed Consent: I have reviewed the patients History and Physical, chart, labs and discussed the procedure including the risks, benefits and alternatives for the proposed anesthesia with the patient or authorized representative who has indicated his/her understanding and acceptance.   Dental advisory given  Plan Discussed with: CRNA, Anesthesiologist and Surgeon  Anesthesia Plan Comments: (Lumbar spinal stenosis Morbid Obesity Type 2 DM Htn Low risk nuclear stress test  Plan GA with oral ETT  Roberts Gaudy, MD)        Anesthesia Quick Evaluation

## 2013-03-11 NOTE — Progress Notes (Signed)
Attempted to wean patient to [place] with ETCO2 monitoring.  Patient maintained good etco2 levels but his oxygenation dropped.  Dr. Linna Caprice at the bedside, discontinued Precedex and MD planning to place patient on CPAP

## 2013-03-11 NOTE — Anesthesia Postprocedure Evaluation (Signed)
  Anesthesia Post-op Note  Patient: Phillip Rocha  Procedure(s) Performed: Procedure(s): Central laminectomy L3-4, L4-5, Bilateral hemi-laminectomy L5-S1, Transforaminal lumber interbody fusion left L3-4,L4-5, L5-S1 with pedicle screws and rods L3-S1 Local Bone Graft, Conform DBX (N/A)  Patient Location: PACU  Anesthesia Type:General  Level of Consciousness: awake, oriented and sedated  Airway and Oxygen Therapy: Patient connected to face mask oxygen and Patient connected to face mask  Post-op Pain: mild  Post-op Assessment: Post-op Vital signs reviewed, Patient's Cardiovascular Status Stable, Respiratory Function Stable, Patent Airway, No signs of Nausea or vomiting and Pain level controlled  Post-op Vital Signs: stable  Complications: No apparent anesthesia complications

## 2013-03-12 ENCOUNTER — Inpatient Hospital Stay (HOSPITAL_COMMUNITY): Payer: Medicare HMO

## 2013-03-12 DIAGNOSIS — E119 Type 2 diabetes mellitus without complications: Secondary | ICD-10-CM

## 2013-03-12 DIAGNOSIS — N179 Acute kidney failure, unspecified: Secondary | ICD-10-CM | POA: Diagnosis present

## 2013-03-12 DIAGNOSIS — E875 Hyperkalemia: Secondary | ICD-10-CM

## 2013-03-12 DIAGNOSIS — G4733 Obstructive sleep apnea (adult) (pediatric): Secondary | ICD-10-CM

## 2013-03-12 LAB — BASIC METABOLIC PANEL
BUN: 43 mg/dL — ABNORMAL HIGH (ref 6–23)
BUN: 47 mg/dL — ABNORMAL HIGH (ref 6–23)
CHLORIDE: 99 meq/L (ref 96–112)
CO2: 19 mEq/L (ref 19–32)
CO2: 20 mEq/L (ref 19–32)
Calcium: 7 mg/dL — ABNORMAL LOW (ref 8.4–10.5)
Calcium: 7.5 mg/dL — ABNORMAL LOW (ref 8.4–10.5)
Chloride: 102 mEq/L (ref 96–112)
Creatinine, Ser: 3.22 mg/dL — ABNORMAL HIGH (ref 0.50–1.35)
Creatinine, Ser: 3.58 mg/dL — ABNORMAL HIGH (ref 0.50–1.35)
GFR calc Af Amer: 24 mL/min — ABNORMAL LOW (ref >90)
GFR calc non Af Amer: 20 mL/min — ABNORMAL LOW (ref >90)
GFR, EST AFRICAN AMERICAN: 21 mL/min — AB (ref >90)
GFR, EST NON AFRICAN AMERICAN: 18 mL/min — AB (ref >90)
Glucose, Bld: 213 mg/dL — ABNORMAL HIGH (ref 70–99)
Glucose, Bld: 146 mg/dL — ABNORMAL HIGH (ref 70–99)
POTASSIUM: 5.1 meq/L (ref 3.7–5.3)
Potassium: 6.4 mEq/L — ABNORMAL HIGH (ref 3.7–5.3)
SODIUM: 134 meq/L — AB (ref 137–147)
Sodium: 137 mEq/L (ref 137–147)

## 2013-03-12 LAB — URINALYSIS, ROUTINE W REFLEX MICROSCOPIC
Glucose, UA: NEGATIVE mg/dL
Ketones, ur: 15 mg/dL — AB
NITRITE: NEGATIVE
Protein, ur: 30 mg/dL — AB
Specific Gravity, Urine: 1.022 (ref 1.005–1.030)
UROBILINOGEN UA: 0.2 mg/dL (ref 0.0–1.0)
pH: 5 (ref 5.0–8.0)

## 2013-03-12 LAB — URINE MICROSCOPIC-ADD ON

## 2013-03-12 LAB — CBC WITH DIFFERENTIAL/PLATELET
BASOS ABS: 0 10*3/uL (ref 0.0–0.1)
Basophils Relative: 0 % (ref 0–1)
Eosinophils Absolute: 0 10*3/uL (ref 0.0–0.7)
Eosinophils Relative: 0 % (ref 0–5)
HCT: 32.5 % — ABNORMAL LOW (ref 39.0–52.0)
Hemoglobin: 10.5 g/dL — ABNORMAL LOW (ref 13.0–17.0)
Lymphocytes Relative: 13 % (ref 12–46)
Lymphs Abs: 3.1 10*3/uL (ref 0.7–4.0)
MCH: 25.8 pg — AB (ref 26.0–34.0)
MCHC: 32.3 g/dL (ref 30.0–36.0)
MCV: 79.9 fL (ref 78.0–100.0)
Monocytes Absolute: 1.6 10*3/uL — ABNORMAL HIGH (ref 0.1–1.0)
Monocytes Relative: 7 % (ref 3–12)
NEUTROS PCT: 80 % — AB (ref 43–77)
Neutro Abs: 18.7 10*3/uL — ABNORMAL HIGH (ref 1.7–7.7)
PLATELETS: 174 10*3/uL (ref 150–400)
RBC: 4.07 MIL/uL — ABNORMAL LOW (ref 4.22–5.81)
RDW: 16.2 % — ABNORMAL HIGH (ref 11.5–15.5)
WBC: 23.4 10*3/uL — ABNORMAL HIGH (ref 4.0–10.5)

## 2013-03-12 LAB — GLUCOSE, CAPILLARY
GLUCOSE-CAPILLARY: 205 mg/dL — AB (ref 70–99)
GLUCOSE-CAPILLARY: 157 mg/dL — AB (ref 70–99)
Glucose-Capillary: 192 mg/dL — ABNORMAL HIGH (ref 70–99)
Glucose-Capillary: 143 mg/dL — ABNORMAL HIGH (ref 70–99)

## 2013-03-12 LAB — HEMOGLOBIN A1C
HEMOGLOBIN A1C: 7.4 % — AB (ref <5.7)
MEAN PLASMA GLUCOSE: 166 mg/dL — AB (ref <117)

## 2013-03-12 MED ORDER — SODIUM CHLORIDE 0.9 % IV SOLN
2.0000 g | Freq: Once | INTRAVENOUS | Status: AC
  Administered 2013-03-12: 2 g via INTRAVENOUS
  Filled 2013-03-12: qty 20

## 2013-03-12 MED ORDER — DEXTROSE 50 % IV SOLN
50.0000 mL | Freq: Once | INTRAVENOUS | Status: AC
  Administered 2013-03-12: 50 mL via INTRAVENOUS
  Filled 2013-03-12: qty 50

## 2013-03-12 MED ORDER — LACTATED RINGERS IV BOLUS (SEPSIS)
500.0000 mL | Freq: Once | INTRAVENOUS | Status: AC
  Administered 2013-03-12: 500 mL via INTRAVENOUS

## 2013-03-12 MED ORDER — SODIUM CHLORIDE 0.9 % IV SOLN
250.0000 mL | INTRAVENOUS | Status: DC
  Administered 2013-03-12: 09:00:00 via INTRAVENOUS

## 2013-03-12 MED ORDER — SODIUM POLYSTYRENE SULFONATE 15 GM/60ML PO SUSP
15.0000 g | Freq: Once | ORAL | Status: AC
  Administered 2013-03-12: 15 g via ORAL
  Filled 2013-03-12: qty 60

## 2013-03-12 MED ORDER — INSULIN ASPART 100 UNIT/ML ~~LOC~~ SOLN
5.0000 [IU] | Freq: Once | SUBCUTANEOUS | Status: AC
  Administered 2013-03-12: 5 [IU] via SUBCUTANEOUS

## 2013-03-12 MED ORDER — DIPHENHYDRAMINE HCL 50 MG/ML IJ SOLN
25.0000 mg | Freq: Three times a day (TID) | INTRAMUSCULAR | Status: DC | PRN
  Administered 2013-03-12 – 2013-03-17 (×11): 25 mg via INTRAVENOUS
  Filled 2013-03-12 (×11): qty 1

## 2013-03-12 MED ORDER — ALLOPURINOL 100 MG PO TABS
50.0000 mg | ORAL_TABLET | Freq: Every day | ORAL | Status: DC
  Administered 2013-03-13 – 2013-03-15 (×3): 50 mg via ORAL
  Filled 2013-03-12 (×3): qty 0.5

## 2013-03-12 MED ORDER — SODIUM CHLORIDE 0.9 % IV SOLN
250.0000 mL | INTRAVENOUS | Status: DC
  Administered 2013-03-12: 250 mL via INTRAVENOUS
  Administered 2013-03-13: 10:00:00 via INTRAVENOUS

## 2013-03-12 MED ORDER — METOCLOPRAMIDE HCL 5 MG/ML IJ SOLN
10.0000 mg | Freq: Three times a day (TID) | INTRAMUSCULAR | Status: AC
  Administered 2013-03-12 – 2013-03-13 (×5): 10 mg via INTRAVENOUS
  Filled 2013-03-12 (×6): qty 2

## 2013-03-12 MED FILL — Heparin Sodium (Porcine) Inj 1000 Unit/ML: INTRAMUSCULAR | Qty: 30 | Status: AC

## 2013-03-12 MED FILL — Sodium Chloride Irrigation Soln 0.9%: Qty: 3000 | Status: AC

## 2013-03-12 MED FILL — Sodium Chloride IV Soln 0.9%: INTRAVENOUS | Qty: 1000 | Status: AC

## 2013-03-12 NOTE — Progress Notes (Signed)
Occupational Therapy Evaluation Patient Details Name: Phillip Rocha MRN: [id number] DOB: Nov 17, 1960 Today's Date: 03/12/2013 Time: 1030-1100 OT Time Calculation (min): 30 min  OT Assessment / Plan / Recommendation History of present illness pt s/p Central laminectomy L3-4, L4-5,L5-S1, Transforaminal lumber interbody fusion left L3-4,L4-5, L5-S1 with pedicle screws and rods L3-S1 Local Bone Graft   Clinical Impression   PTA, pt lived with cousin and was independent with ADL and mobility. Recent R TKA (August 2014). Pt with significant decline in functional status. Pt appeared upset about therapists trying to work with mobility today. Explained rehab protocol with pt, including OOB POD1. Pt did not get OOB in chair today due to weakness and difficulty shifting weight in standing. Pt side stepped to head of bed. Will attempt to get pt OOB tomorrow.  Pt will need SNF for rehab. Pt will benefit from skilled OT services to facilitate D/C to next venue due to below deficits.will begin education on AE    OT Assessment  Patient needs continued OT Services    Follow Up Recommendations  SNF;Supervision/Assistance - 24 hour    Barriers to Discharge Decreased caregiver support    Equipment Recommendations  Other (comment) (TBD by SNF)    Recommendations for Other Services    Frequency  Min 2X/week    Precautions / Restrictions Precautions Precautions: Back Precaution Booklet Issued: Yes (comment) Precaution Comments: pt with verbal understanding but no carryover Required Braces or Orthoses: Spinal Brace Spinal Brace: Lumbar corset;Applied in sitting position Restrictions Weight Bearing Restrictions: No   Pertinent Vitals/Pain Vitals stable C/o pain C/o dizziness but BP stable    ADL  Eating/Feeding: Supervision/safety;Set up Where Assessed - Eating/Feeding: Edge of bed Grooming: Minimal assistance Where Assessed - Grooming: Unsupported sitting Upper Body Bathing: Minimal  assistance Where Assessed - Upper Body Bathing: Unsupported sitting Lower Body Bathing: Maximal assistance Where Assessed - Lower Body Bathing: Supported sit to stand Upper Body Dressing: Moderate assistance Where Assessed - Upper Body Dressing: Unsupported sitting Lower Body Dressing: Maximal assistance Where Assessed - Lower Body Dressing: Supported sit to stand Toilet Transfer: +2 Total assistance;Maximal assistance Toileting - Clothing Manipulation and Hygiene: +1 Total assistance Equipment Used: Back brace;Gait belt;Rolling walker Transfers/Ambulation Related to ADLs: +2 total A ADL Comments: will benefit from AE    OT Diagnosis: Generalized weakness;Acute pain  OT Problem List: Decreased strength;Decreased activity tolerance;Decreased range of motion;Impaired balance (sitting and/or standing);Decreased safety awareness;Decreased knowledge of use of DME or AE;Decreased knowledge of precautions;Obesity;Pain OT Treatment Interventions: Self-care/ADL training;Therapeutic exercise;DME and/or AE instruction;Therapeutic activities;Patient/family education   OT Goals(Current goals can be found in the care plan section) Acute Rehab OT Goals Patient Stated Goal: to not be in pain OT Goal Formulation: With patient Time For Goal Achievement: 03/26/13 Potential to Achieve Goals: Good  Visit Information  Last OT Received On: 03/12/13 Assistance Needed: +2 Reason for Co-Treatment: Complexity of the patient's impairments (multi-system involvement);For patient/therapist safety PT goals addressed during session: Mobility/safety with mobility OT goals addressed during session: ADL's and self-care History of Present Illness: pt s/p Central laminectomy L3-4, L4-5,L5-S1, Transforaminal lumber interbody fusion left L3-4,L4-5, L5-S1 with pedicle screws and rods L3-S1 Local Bone Graft       Prior Functioning     Home Living Family/patient expects to be discharged to:: Skilled nursing  facility Living Arrangements:  (with cousin) Additional Comments: pt indep PTA, retired, used [employer], cousin works during the day Prior Function Level of Independence: Independent with assistive device(s) Comments: used Scientist, water quality Communication: No difficulties Dominant  Hand: Right         Vision/Perception     Cognition  Cognition Arousal/Alertness: Awake/alert Behavior During Therapy: Anxious Overall Cognitive Status: Impaired/Different from baseline Area of Impairment: Attention;Safety/judgement;Problem solving;Awareness Current Attention Level: Sustained Safety/Judgement: Decreased awareness of safety;Decreased awareness of deficits Awareness: Emergent Problem Solving: Slow processing;Difficulty sequencing;Requires verbal cues;Requires tactile cues General Comments: pt required maximal v/c's for safety    Extremity/Trunk Assessment Upper Extremity Assessment Upper Extremity Assessment: Overall WFL for tasks assessed Lower Extremity Assessment Lower Extremity Assessment: RLE deficits/detail;LLE deficits/detail RLE Deficits / Details: grossly 4-/5 - recent TKA in august LLE Deficits / Details: generalized weakness Cervical / Trunk Assessment Cervical / Trunk Assessment:  (recent surgery)     Mobility Bed Mobility Overal bed mobility: Needs Assistance Bed Mobility: Rolling;Sidelying to Sit;Sit to Sidelying Rolling: Min assist (for LEs) Sidelying to sit: +2 for physical assistance;Max assist Sit to sidelying: Max assist;+2 for physical assistance General bed mobility comments: assist for trunk elevation and LE management Transfers Overall transfer level: Needs assistance Equipment used: Rolling walker (2 wheeled) Transfers: Sit to/from Stand Sit to Stand: Max assist;+2 physical assistance General transfer comment: assist at R knee to prevent buckling, pt must use rocking technique     Exercise     Balance Balance Overall balance assessment: Needs  assistance Sitting-balance support: No upper extremity supported Sitting balance-Leahy Scale: Good Standing balance support: Bilateral upper extremity supported Standing balance-Leahy Scale: Fair Standing balance comment: required max vc for upright posture.    End of Session OT - End of Session Equipment Utilized During Treatment: Gait belt;Rolling walker;Back brace Activity Tolerance: Patient limited by fatigue;Patient limited by pain Patient left: in bed;with call bell/phone within reach Nurse Communication: Mobility status;Precautions;Other (comment) (Pt not safe today to stand step to chair)  GO     Adanna Zuckerman,HILLARY 03/12/2013, 12:03 PM [facility], OTR/L  [phone] 03/12/2013
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
## 2013-03-12 NOTE — Progress Notes (Signed)
UR completed.  Sandi Mariscal, RN BSN [place] CCM Trauma/Neuro ICU Case Manager [phone]

## 2013-03-12 NOTE — Progress Notes (Signed)
Clinical Social Work Department BRIEF PSYCHOSOCIAL ASSESSMENT 03/12/2013  Patient:  Phillip Rocha, Phillip Rocha     Account Number:  [id number]     Admit date:  03/11/2013  Clinical Social Worker:  Freeman Caldron  Date/Time:  03/12/2013 03:24 PM  Referred by:  Physician  Date Referred:  03/12/2013 Referred for  SNF Placement   Other Referral:   Interview type:  Patient Other interview type:    PSYCHOSOCIAL DATA Living Status:  FAMILY Admitted from facility:   Level of care:   Primary support name:  Theodis Aguas ([phone]) Primary support relationship to patient:  FAMILY Degree of support available:   Good--pt lives with cousin.    CURRENT CONCERNS Current Concerns  Post-Acute Placement   Other Concerns:    SOCIAL WORK ASSESSMENT / PLAN CSW spoke with pt, who states he believes he should go to SNF for rehab. Pt was living with his cousin prior to hospitalization. CSW asked pt if he has been to a facility in the past, and pt states he has been to [facility] but does not want to go back there. CSW asked if he knows about other facilities, and pt requested list to be left in his room so he can go through it with his cousin. CSW left list of SNFs on desk, as pt requested, with CSW name and phone number at the top for them to call after they review list.   Assessment/plan status:  Psychosocial Support/Ongoing Assessment of Needs Other assessment/ plan:   Information/referral to community resources:   SNF    PATIENT'S/FAMILY'S RESPONSE TO PLAN OF CARE: Good--pt friendly and engaged in conversation with CSW. Pt explained he wants to go to SNF to get strength up before returning home.       Ky Barban, MSW, [facility] Clinical Social Worker [phone]

## 2013-03-12 NOTE — Progress Notes (Signed)
Patient ID: Phillip Rocha, male   DOB: 07/15/1960, 53 y.o.   MRN: [id number] Subjective: 1 Day Post-Op Procedure(s) (LRB): Central laminectomy L3-4, L4-5, Bilateral hemi-laminectomy L5-S1, Transforaminal lumber interbody fusion left L3-4,L4-5, L5-S1 with pedicle screws and rods L3-S1 Local Bone Graft, Conform DBX (N/A) Awake, alert and oriented x 4. Right leg baseline dorsiflexion weakness. My mouth is dry. Patient reports pain as marked.    Objective:   VITALS:  Temp:  [97 F (36.1 C)-99.1 F (37.3 C)] 98.7 F (37.1 C) (01/28 0359) Pulse Rate:  [88-118] 108 (01/28 0500) Resp:  [11-33] 19 (01/28 0400) BP: (91-151)/(47-98) 148/98 mmHg (01/28 0500) SpO2:  [82 %-100 %] 90 % (01/28 0500) Arterial Line BP: (79-128)/(42-95) 100/85 mmHg (01/28 0500) Weight:  [162.66 kg (358 lb 9.6 oz)] 162.66 kg (358 lb 9.6 oz) (01/27 1300)  ABD soft Sensation intact distally Intact pulses distally Dorsiflexion/Plantar flexion intact Incision: dressing C/D/I and no drainage, abdomen is distended, hypoactive BS(abscent) Hemovac removed.   LABS  Recent Labs  03/11/13 0931 03/11/13 1102 03/11/13 1302 03/11/13 1507  HGB 11.2* 10.5* 11.2* 10.9*    Recent Labs  03/11/13 1102 03/11/13 1302 03/11/13 1507  NA 138 136* 134*  K 4.6 5.2 6.0*  GLUCOSE 172*  --  191*   No results found for this basename: LABPT, INR,  in the last 72 hours   Assessment/Plan: 1 Day Post-Op Procedure(s) (LRB): Central laminectomy L3-4, L4-5, Bilateral hemi-laminectomy L5-S1, Transforaminal lumber interbody fusion left L3-4,L4-5, L5-S1 with pedicle screws and rods L3-S1 Local Bone Graft, Conform DBX (N/A) Decreased urine output.,   Up with therapy Transfer to stepdown if available or to floor, keep bariatric bed. [place] or [place]. Keep foley. Bolus with lactated ringers 500cc and increase basal IVF rate to 125cc/hr.  NITKA,JAMES E 03/12/2013, 7:57 AM

## 2013-03-12 NOTE — Evaluation (Signed)
Physical Therapy Evaluation Patient Details Name: Phillip Rocha MRN: [id number] DOB: [birth year] Today's Date: 03/12/2013 Time: 1010-1105 PT Time Calculation (min): 55 min  PT Assessment / Plan / Recommendation History of Present Illness  pt s/p Central laminectomy L3-4, L4-5,L5-S1, Transforaminal lumber interbody fusion left L3-4,L4-5, L5-S1 with pedicle screws and rods L3-S1 Local Bone Graft  Clinical Impression  Pt with high anxiety re: surgical back pain and movement. Pt with bilat LE weakness R>L and L LE numbness limiting ability to transfer to chair this date. Pt currently unsafe to return home with cousin due to increased assist required for safe mobility and ADLs. Pt to benefit from ST-SNF stay to address mentioned deficits and achieve safe mod I function for safe transition home.     PT Assessment  Patient needs continued PT services    Follow Up Recommendations  SNF;Supervision/Assistance - 24 hour    Does the patient have the potential to tolerate intense rehabilitation      Barriers to Discharge Decreased caregiver support      Equipment Recommendations  None recommended by PT    Recommendations for Other Services     Frequency Min 4X/week    Precautions / Restrictions Precautions Precautions: Back Precaution Booklet Issued: Yes (comment) Precaution Comments: pt with verbal understanding but no carryover Required Braces or Orthoses: Spinal Brace Spinal Brace: Lumbar corset;Applied in sitting position Restrictions Weight Bearing Restrictions: No   Pertinent Vitals/Pain 15/10 surgical back pain      Mobility  Bed Mobility Overal bed mobility: Needs Assistance Bed Mobility: Rolling;Sidelying to Sit;Sit to Sidelying Rolling: Min assist (for LEs) Sidelying to sit: +2 for physical assistance;Max assist Sit to sidelying: Max assist;+2 for physical assistance General bed mobility comments: assist for trunk elevation and LE management Transfers Overall  transfer level: Needs assistance Equipment used: Rolling walker (2 wheeled) Transfers: Sit to/from Stand Sit to Stand: Max assist;+2 physical assistance General transfer comment: assist at R knee to prevent buckling, pt must use rocking technique Ambulation/Gait Ambulation/Gait assistance: Mod assist;+2 safety/equipment Ambulation Distance (Feet):  (4 steps to [facility]) Assistive device: Rolling walker (2 wheeled) Gait Pattern/deviations: Step-to pattern;Wide base of support    Exercises     PT Diagnosis: Difficulty walking;Generalized weakness;Acute pain  PT Problem List: Decreased strength;Decreased activity tolerance;Decreased balance;Decreased mobility PT Treatment Interventions: DME instruction;Gait training;Functional mobility training;Therapeutic activities;Therapeutic exercise     PT Goals(Current goals can be found in the care plan section) Acute Rehab PT Goals PT Goal Formulation: With patient Time For Goal Achievement: 03/19/13 Potential to Achieve Goals: Good  Visit Information  Last PT Received On: 03/12/13 Assistance Needed: +2 PT/OT/SLP Co-Evaluation/Treatment: Yes Reason for Co-Treatment: Complexity of the patient's impairments (multi-system involvement) PT goals addressed during session: Mobility/safety with mobility OT goals addressed during session: ADL's and self-care History of Present Illness: pt s/p Central laminectomy L3-4, L4-5,L5-S1, Transforaminal lumber interbody fusion left L3-4,L4-5, L5-S1 with pedicle screws and rods L3-S1 Local Bone Graft       Prior Functioning  Home Living Family/patient expects to be discharged to:: Skilled nursing facility Living Arrangements:  (with cousin) Additional Comments: pt indep PTA, retired, used RW, cousin works during the day Prior Function Level of Independence: Independent with assistive device(s) Comments: used Physiological scientist: No difficulties Dominant Hand: Right    Cognition   Cognition Arousal/Alertness: Awake/alert Behavior During Therapy: Anxious Overall Cognitive Status: Impaired/Different from baseline Area of Impairment: Attention;Safety/judgement;Problem solving;Awareness Current Attention Level: Sustained Safety/Judgement: Decreased awareness of safety;Decreased awareness of deficits Awareness: Emergent Problem Solving:  Slow processing;Difficulty sequencing;Requires verbal cues;Requires tactile cues General Comments: pt required maximal v/c's for safety    Extremity/Trunk Assessment Upper Extremity Assessment Upper Extremity Assessment: Overall WFL for tasks assessed Lower Extremity Assessment Lower Extremity Assessment: RLE deficits/detail;LLE deficits/detail RLE Deficits / Details: grossly 4-/5 - recent TKA in august LLE Deficits / Details: generalized weakness Cervical / Trunk Assessment Cervical / Trunk Assessment:  (recent surgery)   Balance Balance Overall balance assessment: Needs assistance Sitting-balance support: No upper extremity supported;Feet supported Sitting balance-Leahy Scale: Good Standing balance support: Bilateral upper extremity supported Standing balance-Leahy Scale: Fair Standing balance comment: pt requires use of RW for safe standing  End of Session PT - End of Session Equipment Utilized During Treatment: Gait belt;Back brace Activity Tolerance: Patient limited by pain;Patient limited by fatigue Patient left: in chair;with call bell/phone within reach Nurse Communication: Mobility status  GP     Kingsley Callander 03/12/2013, 11:54 AM  Kittie Plater, PT, DPT Pager #: [phone] Office #: [phone]

## 2013-03-13 ENCOUNTER — Inpatient Hospital Stay (HOSPITAL_COMMUNITY): Payer: Medicare HMO

## 2013-03-13 DIAGNOSIS — K560 Paralytic ileus: Secondary | ICD-10-CM

## 2013-03-13 DIAGNOSIS — K567 Ileus, unspecified: Secondary | ICD-10-CM | POA: Diagnosis present

## 2013-03-13 DIAGNOSIS — D72829 Elevated white blood cell count, unspecified: Secondary | ICD-10-CM | POA: Diagnosis present

## 2013-03-13 DIAGNOSIS — J9601 Acute respiratory failure with hypoxia: Secondary | ICD-10-CM | POA: Diagnosis present

## 2013-03-13 DIAGNOSIS — E860 Dehydration: Secondary | ICD-10-CM | POA: Diagnosis present

## 2013-03-13 DIAGNOSIS — N170 Acute kidney failure with tubular necrosis: Secondary | ICD-10-CM | POA: Diagnosis present

## 2013-03-13 LAB — BASIC METABOLIC PANEL
BUN: 53 mg/dL — AB (ref 6–23)
CHLORIDE: 98 meq/L (ref 96–112)
CO2: 21 mEq/L (ref 19–32)
Calcium: 7.2 mg/dL — ABNORMAL LOW (ref 8.4–10.5)
Creatinine, Ser: 3.24 mg/dL — ABNORMAL HIGH (ref 0.50–1.35)
GFR calc Af Amer: 23 mL/min — ABNORMAL LOW (ref >90)
GFR calc non Af Amer: 20 mL/min — ABNORMAL LOW (ref >90)
GLUCOSE: 178 mg/dL — AB (ref 70–99)
Potassium: 4.9 mEq/L (ref 3.7–5.3)
SODIUM: 132 meq/L — AB (ref 137–147)

## 2013-03-13 LAB — CBC
HEMATOCRIT: 25.8 % — AB (ref 39.0–52.0)
Hemoglobin: 8.3 g/dL — ABNORMAL LOW (ref 13.0–17.0)
MCH: 25.9 pg — ABNORMAL LOW (ref 26.0–34.0)
MCHC: 32.2 g/dL (ref 30.0–36.0)
MCV: 80.4 fL (ref 78.0–100.0)
Platelets: 154 10*3/uL (ref 150–400)
RBC: 3.21 MIL/uL — AB (ref 4.22–5.81)
RDW: 16.3 % — ABNORMAL HIGH (ref 11.5–15.5)
WBC: 21.2 10*3/uL — AB (ref 4.0–10.5)

## 2013-03-13 LAB — URINE CULTURE
Colony Count: NO GROWTH
Culture: NO GROWTH

## 2013-03-13 LAB — GLUCOSE, CAPILLARY
GLUCOSE-CAPILLARY: 169 mg/dL — AB (ref 70–99)
Glucose-Capillary: 180 mg/dL — ABNORMAL HIGH (ref 70–99)
Glucose-Capillary: 173 mg/dL — ABNORMAL HIGH (ref 70–99)
Glucose-Capillary: 135 mg/dL — ABNORMAL HIGH (ref 70–99)

## 2013-03-13 MED ORDER — SODIUM CHLORIDE 0.9 % IV SOLN: 250.0000 mL | INTRAVENOUS | Status: DC

## 2013-03-13 MED ORDER — BISACODYL 10 MG RE SUPP
10.0000 mg | Freq: Once | RECTAL | Status: AC
  Administered 2013-03-13: 10 mg via RECTAL
  Filled 2013-03-13 (×2): qty 1

## 2013-03-13 MED ORDER — METOPROLOL TARTRATE 1 MG/ML IV SOLN
2.5000 mg | Freq: Four times a day (QID) | INTRAVENOUS | Status: DC
  Administered 2013-03-13 – 2013-03-15 (×8): 2.5 mg via INTRAVENOUS
  Filled 2013-03-13 (×14): qty 5

## 2013-03-13 MED ORDER — ZOLPIDEM TARTRATE 5 MG PO TABS
5.0000 mg | ORAL_TABLET | Freq: Once | ORAL | Status: AC
  Administered 2013-03-13: 5 mg via ORAL
  Filled 2013-03-13: qty 1

## 2013-03-13 NOTE — Progress Notes (Signed)
Physical Therapy Treatment Patient Details Name: Alistar Mcenery MRN: [id number] DOB: 1960-05-15 Today's Date: 03/13/2013 Time: 4098-1191 PT Time Calculation (min): 24 min  PT Assessment / Plan / Recommendation  History of Present Illness pt s/p Central laminectomy L3-4, L4-5,L5-S1, Transforaminal lumber interbody fusion left L3-4,L4-5, L5-S1 with pedicle screws and rods L3-S1 Local Bone Graft   PT Comments   Pt progressing with ambulation. Pt requires increased time for mobility and continues to be very anxious. Max cues for sequencing and management of RW. Pt limited due to decreased activity tolereance and demo SOB with ambulation. O2 on RA was 88%; with cues for energy conservation technique pt increased to 96%. Will cont to follow per POC. Pt continues to require education on back precautions; verbalizes understanding with education but is unable to carryover information.   Follow Up Recommendations  SNF;Supervision/Assistance - 24 hour     Does the patient have the potential to tolerate intense rehabilitation     Barriers to Discharge        Equipment Recommendations  None recommended by PT    Recommendations for Other Services    Frequency Min 4X/week   Progress towards PT Goals Progress towards PT goals: Progressing toward goals  Plan Current plan remains appropriate    Precautions / Restrictions Precautions Precautions: Back Precaution Booklet Issued: Yes (comment) Precaution Comments: pt unable to recall any back precautions; reviewed back precautions thoroughly and gave demo of proper technique with sit to stand  Required Braces or Orthoses: Spinal Brace Spinal Brace: Lumbar corset;Applied in sitting position (per RN; new orders for comfort wear only) Restrictions Weight Bearing Restrictions: No   Pertinent Vitals/Pain "10/10" pt premedicated     Mobility  Bed Mobility General bed mobility comments: not assesed; pt sitting in chair; did review log rolling technique  with pt; pt reports "ive been twisting a lot in bed" encouraged to have (A) for all bed mobility to prevent twisting  Transfers Overall transfer level: Needs assistance Equipment used: Rolling walker (2 wheeled) Transfers: Sit to/from Stand Sit to Stand: +2 safety/equipment;Min assist General transfer comment: pt used rocking technique to shift weight anteriorly for sit to stand; 2 person for safety; max cues for hand placment and safety; pt tends to flop into chair; has difficulty controling descent to chair; performed sit to stand x 2 from recliner  Ambulation/Gait Ambulation/Gait assistance: Min assist;+2 safety/equipment Ambulation Distance (Feet): 40 Feet (x2) Assistive device: Rolling walker (2 wheeled) Gait Pattern/deviations: Wide base of support;Trunk flexed;Decreased stride length;Step-through pattern Gait velocity: decreased Gait velocity interpretation: Below normal speed for age/gender General Gait Details: pt demo SOB with ambulation and demo increased exertion with ambulation; pt with continuous fwd flexed posture; (A) to maintain balance; max cues for sequencing; required sitting rest break during ambulation; pt very anxioius with ambulation and constantly concerned about lines/leads          PT Diagnosis:    PT Problem List:   PT Treatment Interventions:     PT Goals (current goals can now be found in the care plan section) Acute Rehab PT Goals Patient Stated Goal: to walk before i get "loopy" PT Goal Formulation: With patient Time For Goal Achievement: 03/19/13 Potential to Achieve Goals: Good  Visit Information  Last PT Received On: 03/13/13 Assistance Needed: +2 History of Present Illness: pt s/p Central laminectomy L3-4, L4-5,L5-S1, Transforaminal lumber interbody fusion left L3-4,L4-5, L5-S1 with pedicle screws and rods L3-S1 Local Bone Graft    Subjective Data  Subjective: pt sitting in  chair with RN present; per RN and pt; MD canceled order for back brace;  brace to be wore as tolerated  Patient Stated Goal: to walk before i get "loopy"   Cognition  Cognition Arousal/Alertness: Awake/alert Behavior During Therapy: Anxious Overall Cognitive Status: Impaired/Different from baseline Area of Impairment: Problem solving;Safety/judgement;Memory Memory: Decreased short-term memory Safety/Judgement: Decreased awareness of safety;Decreased awareness of deficits Problem Solving: Slow processing;Requires verbal cues;Requires tactile cues    Balance  Balance Overall balance assessment: Needs assistance Postural control: Other (comment) (anterior lean) Standing balance support: Bilateral upper extremity supported;During functional activity Standing balance-Leahy Scale: Fair Standing balance comment: UE supported by RW   End of Session PT - End of Session Equipment Utilized During Treatment: Gait belt;Back brace Activity Tolerance: Patient limited by pain;Patient limited by fatigue Patient left: in chair;with call bell/phone within reach Nurse Communication: Mobility status   GP     Gustavus Bryant, [place] 03/13/2013, 2:51 PM

## 2013-03-13 NOTE — Progress Notes (Addendum)
[facility]  Phillip Rocha WUJ:[id number] DOB: 05-Jul-1960 DOA: 03/11/2013 PCP: Velna Hatchet, MD  Brief narrative: 53 y/o male with HTN, HPL, DM, OSA (not on CPAP), anxiety, psoriatic arthritis who underwent Lumbar spine laminectomy/fusion on 1/27; hospitalist was consulted for evaluation of AKI  Assessment/Plan: Active Problems:   Acute renal failure/ATN (acute tubular necrosis) -baseline renal function preop was normal: 12/0.84 -has peaked at 47/3.58 -suspect combo of pre renal and ATN (was on ARB/Metformin pre op and ARB was resumed briefly post op)-possibly influenced by occult diabetic nephropathy -only received 1 dose of Toradol -cont to hold ARB and Metformin -did not receive IV contrast this admission -continue IVFs    Diabetes mellitus, type 2 -currently controlled -cont SSI -no metformin until ARF resolved    Ileus -abdomen distended -inconsistent passage of flatus but reported "wet" BM this am -sips of clears -XR non obstructive -mobilize with PT -minimize narcs/muscle relaxants as much as possible- unfortunately due to ARF can't supplement with NSAIDs -cont Reglan      Acute respiratory failure with hypoxia -suspect mechanical from abdomen distention and to some degree from pain meds -pt with body habitus c/w OSA but states does not carry this diagnosis -cont supportive care    Hypertension -poorly controlled -suspect combo pain and use of monotherapy -possibly poor absorption of PO Norvasc -DC Norvasc and give IV Lopressor specially with concomitant tachycardia on schedule with hold parameters    Dehydration -cont IVF -calculated water deficit as of 1/28 was 4.2 L    Leukocytosis -suspect reactive due to stress demargination -only low grade fever    GERD (gastroesophageal reflux disease)    Spinal stenosis, lumbar region, with neurogenic claudication   Spondylolisthesis at L3-L4 level  Spondylolisthesis at L4-L5 level -per Dr. Louanne Skye   DVT prophylaxis: SCDs Code Status: Full Family Communication: No family at bedside Disposition Plan/Expected LOS: Stepdown   Procedures: Central laminectomy L3-4, L4-5,L5-S1, Transforaminal lumber interbody fusion left L3-4,L4-5, L5-S1 with pedicle screws and rods L3-S1 Local Bone Graft  1/27   Antibiotics: Ancef preoperative/intraoperative dosing only  HPI/Subjective: Patient awake sitting up in chair. Endorses continued abdominal distention but no nausea, vomiting, diarrhea or abdominal pain.  Objective: Blood pressure 165/96, pulse 108, temperature 97.8 F (36.6 C), temperature source Oral, resp. rate 16, height 5' 11" (1.803 m), weight 360 lb (163.295 kg), SpO2 92.00%.  Intake/Output Summary (Last 24 hours) at 03/13/13 1336 Last data filed at 03/13/13 1200  Gross per 24 hour  Intake   3908 ml  Output   2165 ml  Net   1743 ml     Exam: General: No acute respiratory distress-drowsy at time Lungs: Clear to auscultation bilaterally but diminished in bases without wheezes or crackles, 3 L Cardiovascular: Regular somewhat tachycardic rate and rhythm without murmur gallop or rub normal S1 and S2, no peripheral edema or JVD Abdomen: Nontender, distended, and admitted, hypoactive rarely audible bowel sounds positive, no rebound, no ascites, no appreciable mass Musculoskeletal: No significant cyanosis, clubbing of bilateral lower extremities Neurological: Awake and oriented x 3, moves all extremities x 4 without focal neurological deficits, CN 2-12 intact  Scheduled Meds:  Scheduled Meds: . allopurinol  50 mg Oral Daily  . amLODipine  10 mg Oral Daily  . atorvastatin  40 mg Oral Daily  . docusate sodium  100 mg Oral BID  . insulin aspart  0-15 Units Subcutaneous TID WC  . metoCLOPramide (REGLAN) injection  10 mg Intravenous  Q8H  . metoprolol  2.5 mg Intravenous Q6H  . pantoprazole  80 mg Oral Daily  . sertraline  25 mg  Oral Daily  . sodium chloride  3 mL Intravenous Q12H   Continuous Infusions: . sodium chloride 250 mL (03/13/13 1200)    Data Reviewed: Basic Metabolic Panel:  Recent Labs Lab 03/11/13 1102 03/11/13 1302 03/11/13 1507 03/12/13 0910 03/12/13 1600 03/13/13 0236  NA 138 136* 134* 137 134* 132*  K 4.6 5.2 6.0* 6.4* 5.1 4.9  CL  --   --   --  102 99 98  CO2  --   --   --  _0 GLUCOSE 172*  --  191* 213* 146* 178*  BUN  --   --   --  43* 47* 53*  CREATININE  --   --   --  3.22* 3.58* 3.24*  CALCIUM  --   --   --  7.0* 7.5* 7.2*   Liver Function Tests: No results found for this basename: AST, ALT, ALKPHOS, BILITOT, PROT, ALBUMIN,  in the last 168 hours No results found for this basename: LIPASE, AMYLASE,  in the last 168 hours No results found for this basename: AMMONIA,  in the last 168 hours CBC:  Recent Labs Lab 03/11/13 1102 03/11/13 1302 03/11/13 1507 03/12/13 0910 03/13/13 0236  WBC  --   --   --  23.4* 21.2*  NEUTROABS  --   --   --  18.7*  --   HGB 10.5* 11.2* 10.9* 10.5* 8.3*  HCT 31.0* 33.0* 32.0* 32.5* 25.8*  MCV  --   --   --  79.9 80.4  PLT  --   --   --  174 154   Cardiac Enzymes: No results found for this basename: CKTOTAL, CKMB, CKMBINDEX, TROPONINI,  in the last 168 hours BNP (last 3 results) No results found for this basename: PROBNP,  in the last 8760 hours CBG:  Recent Labs Lab 03/12/13 1130 03/12/13 1724 03/12/13 2156 03/13/13 0736 03/13/13 1108  GLUCAP 192* 143* 157* 180* 169*    No results found for this or any previous visit (from the past 240 hour(s)).   Studies:  Recent x-ray studies have been reviewed in detail by the Attending Physician  Time spent :     Erin Hearing, [place] [facility] Office  [phone] Pager [phone]  **If unable to reach the above provider after paging please contact the [place] @ [phone]  On-Call/Text Page:      [email or web address]      password [email or web address]  If 7PM-7AM, please contact  night-coverage [email or web address] Password [facility] 03/13/2013, 1:36 PM   LOS: 2 days   I have examined the patient, reviewed the chart and modified the above note which I agree with.   Lemar Bakos,MD [phone] 03/13/2013, 5:13 PM

## 2013-03-13 NOTE — Progress Notes (Addendum)
Clinical Social Work Department CLINICAL SOCIAL WORK PLACEMENT NOTE 03/13/2013  Patient:  Phillip Rocha, Phillip Rocha  Account Number:  [id number] [place] date:  03/11/2013  Clinical Social Worker:  Megan Salon  Date/time:  03/13/2013 12:06 PM  Clinical Social Work is seeking post-discharge placement for this patient at the following level of care:   [place]   (*CSW will update this form in Epic as items are completed)   03/12/2013  Patient/family provided with [place] Department of Clinical Social Work's list of facilities offering this level of care within the geographic area requested by the patient (or if unable, by the patient's family).  03/12/2013  Patient/family informed of their freedom to choose among providers that offer the needed level of care, that participate in Medicare, Medicaid or managed care program needed by the patient, have an available bed and are willing to accept the patient.    Patient/family informed of [facility]' ownership interest in [facility], as well as of the fact that they are under no obligation to receive care at this facility.  PASARR submitted to EDS on  PASARR number received from [place] on   FL2 transmitted to all facilities in geographic area requested by pt/family on  03/13/2013 FL2 transmitted to all facilities within larger geographic area on   Patient informed that his/her managed care company has contracts with or will negotiate with  certain facilities, including the following:     Patient/family informed of bed offers received:   03/14/2013 Patient chooses bed at  Physician recommends and patient chooses bed at    Patient to be transferred to  on   Patient to be transferred to facility by   The following physician request were entered in Epic:   Additional Comments: Patient has existing [place], MSW, [place]

## 2013-03-13 NOTE — Progress Notes (Signed)
Patient ID: Phillip Rocha, male   DOB: 08-06-1960, 53 y.o.   MRN: [id number] Subjective: 2 Days Post-Op Procedure(s) (LRB): Central laminectomy L3-4, L4-5, Bilateral hemi-laminectomy L5-S1, Transforaminal lumber interbody fusion left L3-4,L4-5, L5-S1 with pedicle screws and rods L3-S1 Local Bone Graft, Conform DBX (N/A)Awake, alert and oriented x 4. Appreciate medicine's input. When can I have something to drink, my lips are dry. History of  Humira, distant steriod history for psoriasis and ESI for stenosis.  Patient reports pain as moderate.    Objective:   VITALS:  Temp:  [97.5 F (36.4 C)-98.4 F (36.9 C)] 98 F (36.7 C) (01/29 0444) Pulse Rate:  [106-120] 114 (01/29 0600) Resp:  [0-27] 24 (01/29 0600) BP: (104-162)/(48-94) 126/65 mmHg (01/29 0600) SpO2:  [84 %-100 %] 96 % (01/29 0600) Arterial Line BP: (116-118)/(90-94) 118/94 mmHg (01/28 1000) FiO2 (%):  [35 %] 35 % (01/29 0600) Weight:  [163.295 kg (360 lb)] 163.295 kg (360 lb) (01/28 1500)  Neurologically intact ABD soft Neurovascular intact Sensation intact distally Incision: scant drainage and no induration no fluctuance. Abdomen is distended, hypoactive BS. Abdomenal xray with gas pattern c/w mild ileus.  LABS  Recent Labs  03/11/13 1102 03/11/13 1302 03/11/13 1507 03/12/13 0910 03/13/13 0236  HGB 10.5* 11.2* 10.9* 10.5* 8.3*  WBC  --   --   --  23.4* 21.2*  PLT  --   --   --  174 154    Recent Labs  03/12/13 1600 03/13/13 0236  NA 134* 132*  K 5.1 4.9  CL 99 98  CO2 20 21  BUN 47* 53*  CREATININE 3.58* 3.24*  GLUCOSE 146* 178*   No results found for this basename: LABPT, INR,  in the last 72 hours   Assessment/Plan: 2 Days Post-Op Procedure(s) (LRB): Central laminectomy L3-4, L4-5, Bilateral hemi-laminectomy L5-S1, Transforaminal lumber interbody fusion left L3-4,L4-5, L5-S1 with pedicle screws and rods L3-S1 Local Bone Graft, Conform DBX (N/A) Acute blood loss anemia. Acute renal  insufficiency. Hyperkalemia improved  May transfer to step down unit, I think he is still needing careful attention I&Os and will not do well on regular floor. Will allow for clear liquids, sips and needs to becareful to avoid carbonated beverages to avoid further abdomenal distention Advance diet Up with therapy  Beth Spackman E 03/13/2013, 8:14 AM

## 2013-03-14 ENCOUNTER — Inpatient Hospital Stay (HOSPITAL_COMMUNITY): Payer: Medicare HMO

## 2013-03-14 DIAGNOSIS — J9600 Acute respiratory failure, unspecified whether with hypoxia or hypercapnia: Secondary | ICD-10-CM

## 2013-03-14 LAB — COMPREHENSIVE METABOLIC PANEL
ALT: 75 U/L — ABNORMAL HIGH (ref 0–53)
AST: 312 U/L — ABNORMAL HIGH (ref 0–37)
Albumin: 2.5 g/dL — ABNORMAL LOW (ref 3.5–5.2)
Alkaline Phosphatase: 77 U/L (ref 39–117)
BUN: 33 mg/dL — ABNORMAL HIGH (ref 6–23)
CALCIUM: 7.5 mg/dL — AB (ref 8.4–10.5)
CO2: 23 meq/L (ref 19–32)
Chloride: 101 mEq/L (ref 96–112)
Creatinine, Ser: 1.43 mg/dL — ABNORMAL HIGH (ref 0.50–1.35)
GFR, EST AFRICAN AMERICAN: 63 mL/min — AB (ref >90)
GFR, EST NON AFRICAN AMERICAN: 54 mL/min — AB (ref >90)
GLUCOSE: 139 mg/dL — AB (ref 70–99)
Potassium: 4.5 mEq/L (ref 3.7–5.3)
Sodium: 137 mEq/L (ref 137–147)
Total Bilirubin: 0.5 mg/dL (ref 0.3–1.2)
Total Protein: 6 g/dL (ref 6.0–8.3)

## 2013-03-14 LAB — GLUCOSE, CAPILLARY
GLUCOSE-CAPILLARY: 176 mg/dL — AB (ref 70–99)
GLUCOSE-CAPILLARY: 113 mg/dL — AB (ref 70–99)
Glucose-Capillary: 162 mg/dL — ABNORMAL HIGH (ref 70–99)
Glucose-Capillary: 132 mg/dL — ABNORMAL HIGH (ref 70–99)

## 2013-03-14 LAB — CBC
HCT: 24.7 % — ABNORMAL LOW (ref 39.0–52.0)
HEMOGLOBIN: 7.9 g/dL — AB (ref 13.0–17.0)
MCH: 25.7 pg — AB (ref 26.0–34.0)
MCHC: 32 g/dL (ref 30.0–36.0)
MCV: 80.5 fL (ref 78.0–100.0)
PLATELETS: 169 10*3/uL (ref 150–400)
RBC: 3.07 MIL/uL — ABNORMAL LOW (ref 4.22–5.81)
RDW: 16 % — ABNORMAL HIGH (ref 11.5–15.5)
WBC: 16.6 10*3/uL — ABNORMAL HIGH (ref 4.0–10.5)

## 2013-03-14 MED ORDER — SODIUM CHLORIDE 0.9 % IV SOLN
250.0000 mL | INTRAVENOUS | Status: DC
  Administered 2013-03-15: 250 mL via INTRAVENOUS

## 2013-03-14 MED ORDER — MORPHINE SULFATE 2 MG/ML IJ SOLN
1.0000 mg | INTRAMUSCULAR | Status: DC | PRN
  Administered 2013-03-14 – 2013-03-15 (×4): 2 mg via INTRAVENOUS
  Filled 2013-03-14 (×4): qty 1

## 2013-03-14 NOTE — Progress Notes (Signed)
Physical Therapy Treatment Patient Details Name: Phillip Rocha MRN: [id number] DOB: [birth year] Today's Date: 03/14/2013 Time: 2440-1027 PT Time Calculation (min): 32 min  PT Assessment / Plan / Recommendation  History of Present Illness pt s/p Central laminectomy L3-4, L4-5,L5-S1, Transforaminal lumber interbody fusion left L3-4,L4-5, L5-S1 with pedicle screws and rods L3-S1 Local Bone Graft   PT Comments   Pt requires max encouragement to increase mobility and participate with therapy. Pt very agitated about his care at the moment. Was agreeable to ambulate if he could get back in bed after session. Pt refusing to sit in chair today. Continues to be a high fall risk and require (A) for mobility. Will cont to follow per POC.   Follow Up Recommendations  SNF;Supervision/Assistance - 24 hour     Does the patient have the potential to tolerate intense rehabilitation     Barriers to Discharge        Equipment Recommendations  None recommended by PT    Recommendations for Other Services    Frequency Min 4X/week   Progress towards PT Goals Progress towards PT goals: Progressing toward goals  Plan Current plan remains appropriate    Precautions / Restrictions Precautions Precautions: Fall;Back Precaution Comments: pt unable to recall back precautions; educated on back precautions  Required Braces or Orthoses: Spinal Brace Spinal Brace: Lumbar corset;Applied in sitting position Restrictions Weight Bearing Restrictions: No   Pertinent Vitals/Pain amb on RA at 91%; educated on pursed lip breathing; pain "15/10"    Mobility  Bed Mobility Overal bed mobility: Needs Assistance Bed Mobility: Rolling;Sidelying to Sit Rolling: Min assist Sidelying to sit: +2 for safety/equipment;Min assist Sit to sidelying: Min assist;+2 for safety/equipment;+2 for physical assistance General bed mobility comments: pt educated on log rolling technique; requires 2 person (A) for sidelying <> sit on bed  in order to maintain back precautions; pt with difficulty bringing LEs onto bed and reposition into sidelying position that is appropraite; pt requries incr time for bed mobility  Transfers Overall transfer level: Needs assistance Equipment used: Rolling walker (2 wheeled) Transfers: Sit to/from Stand Sit to Stand: +2 safety/equipment;Min assist General transfer comment: pt continues to use rocking method for transfers; cues for hand placement and LE placement; (A) to maintain balance and for sequencing; perform sit to stand from bed and recliner; requires incr time and demo fatigue after transfers  Ambulation/Gait Ambulation/Gait assistance: +2 safety/equipment;Min assist Ambulation Distance (Feet): 50 Feet Assistive device: Rolling walker (2 wheeled) Gait Pattern/deviations: Decreased stride length;Wide base of support;Trunk flexed;Decreased step length - right Gait velocity: very decreased Gait velocity interpretation: <1.8 ft/sec, indicative of risk for recurrent falls General Gait Details: pt limited in ambulation today due to pain and fatigue; continues to ambulate with fwd flexed trunk and decreased ability to clear Rt LE with gt; pt unsteady and is a high fall risk          PT Diagnosis:    PT Problem List:   PT Treatment Interventions:     PT Goals (current goals can now be found in the care plan section) Acute Rehab PT Goals Patient Stated Goal: to get some rest PT Goal Formulation: With patient Time For Goal Achievement: 03/19/13 Potential to Achieve Goals: Good  Visit Information  Last PT Received On: 03/14/13 Assistance Needed: +2 History of Present Illness: pt s/p Central laminectomy L3-4, L4-5,L5-S1, Transforaminal lumber interbody fusion left L3-4,L4-5, L5-S1 with pedicle screws and rods L3-S1 Local Bone Graft    Subjective Data  Subjective: Pt lying supine;  initially pt declining to get OOB today; pt agreeable with max encouragement  Patient Stated Goal: to get  some rest   Cognition  Cognition Arousal/Alertness: Awake/alert Behavior During Therapy: Anxious;Agitated Overall Cognitive Status: Impaired/Different from baseline Area of Impairment: Problem solving;Safety/judgement;Memory Memory: Decreased short-term memory Safety/Judgement: Decreased awareness of safety;Decreased awareness of deficits Problem Solving: Slow processing;Requires verbal cues;Requires tactile cues    Balance  Balance Overall balance assessment: Needs assistance Sitting-balance support: Feet supported;Single extremity supported Sitting balance-Leahy Scale: Good Sitting balance - Comments: tolerated sitting EOB ~7 min Standing balance support: During functional activity;Bilateral upper extremity supported Standing balance-Leahy Scale: Fair Standing balance comment: bil UE supported  End of Session PT - End of Session Equipment Utilized During Treatment: Gait belt;Back brace Activity Tolerance: Patient limited by pain;Patient limited by fatigue Patient left: in bed;with call bell/phone within reach Nurse Communication: Mobility status   [place], [place], [place] 03/14/2013, 2:36 PM

## 2013-03-14 NOTE — Progress Notes (Signed)
Pt. Refused cpap. RT informed pt. To notify if he changes his mind.

## 2013-03-14 NOTE — Progress Notes (Signed)
Pt has been encouraged to wear bi-pap machine during sleep d/t desaturation but states he is "too claustrophobic for that thing," therefore he has been given the venturi mask and nasal canula as alternative. Pt continues to remove mask and canula and continues to desaturate periodically.  Pt has been educated on the severity of this issue and the importance of proper oxygenation for his body and organs.  Phillip Rocha

## 2013-03-14 NOTE — Clinical Social Work Note (Signed)
Clinical Social Worker continuing to follow patient for support and discharge planning needs.  CSW provided patient with available bed offers and submitted clinicals to [facility] for review.  Patient plans to discuss bed offers with his family and will provide bed choice tomorrow.  CSW to follow up with patient regarding bed choice and insurance authorization.  CSW remains available for support and to facilitate patient discharge needs once medically ready and insurance approval received.  Barbette Or, [place]

## 2013-03-14 NOTE — Progress Notes (Signed)
Subjective: 3 Days Post-Op Procedure(s) (LRB): Central laminectomy L3-4, L4-5, Bilateral hemi-laminectomy L5-S1, Transforaminal lumber interbody fusion left L3-4,L4-5, L5-S1 with pedicle screws and rods L3-S1 Local Bone Graft, Conform DBX (N/A) Pt anxious to be discharged. Patient reports pain as mild.  States pain meds are working well.  Has walked into hallway twice yesterday. Denies nausea or vomiting.  Still with abdominal distention. Using face mask O2 today when seen.  Objective: Vital signs in last 24 hours: Temp:  [98.6 F (37 C)-99 F (37.2 C)] 99 F (37.2 C) (01/30 0307) Pulse Rate:  [37-120] 105 (01/30 0900) Resp:  [12-35] 12 (01/30 0500) BP: (91-169)/(45-141) 127/64 mmHg (01/30 0900) SpO2:  [73 %-98 %] 93 % (01/30 0900) FiO2 (%):  [35 %] 35 % (01/30 0700)  Intake/Output from previous day: 01/29 0701 - 01/30 0700 In: 2503 [P.O.:350; I.V.:2153] Out: 3315 [Urine:3315] Intake/Output this shift: Total I/O In: 12.5 [I.V.:12.5] Out: 350 [Urine:350]   Recent Labs  03/11/13 1302 03/11/13 1507 03/12/13 0910 03/13/13 0236 03/14/13 0215  HGB 11.2* 10.9* 10.5* 8.3* 7.9*    Recent Labs  03/13/13 0236 03/14/13 0215  WBC 21.2* 16.6*  RBC 3.21* 3.07*  HCT 25.8* 24.7*  PLT 154 169    Recent Labs  03/13/13 0236 03/14/13 0215  NA 132* 137  K 4.9 4.5  CL 98 101  CO2 21 23  BUN 53* 33*  CREATININE 3.24* 1.43*  GLUCOSE 178* 139*  CALCIUM 7.2* 7.5*   No results found for this basename: LABPT, INR,  in the last 72 hours  Neurovascular intact Sensation intact distally Dorsiflexion/Plantar flexion intact Incision: no drainage  Assessment/Plan: 3 Days Post-Op Procedure(s) (LRB): Central laminectomy L3-4, L4-5, Bilateral hemi-laminectomy L5-S1, Transforaminal lumber interbody fusion left L3-4,L4-5, L5-S1 with pedicle screws and rods L3-S1 Local Bone Graft, Conform DBX (N/A) Up with therapy Discharge to SNF when stable from medical standpoint. Renal function  improving.  WBC trending down.  hgb trending down also.  Will monitor as pt asymptomatic at present.  Respiratory function improving as well.  Still need BM for further abd decompression.   May be ready for discharge next day or so.  Phillip Rocha M 03/14/2013, 12:30 PM

## 2013-03-14 NOTE — Progress Notes (Signed)
Pt arrived from 48M, VSS, will continue to monitor.

## 2013-03-14 NOTE — Progress Notes (Signed)
[facility]  Phillip Rocha JQB:[id number] DOB: 07-Mar-1960 DOA: 03/11/2013 PCP: Velna Hatchet, MD  Brief narrative: 53 y/o male with HTN, HPL, DM, OSA (not on CPAP), anxiety, psoriatic arthritis who underwent Lumbar spine laminectomy/fusion on 1/27; hospitalist was consulted for evaluation of AKI  Assessment/Plan: Active Problems:   Acute renal failure/ATN (acute tubular necrosis) -baseline renal function preop was normal: 12/0.84 -has peaked at 47/3.58-now trend down to 1.33 -suspect combo of pre renal and ATN (was on ARB/Metformin pre op and ARB was resumed briefly post op)-possibly influenced by occult diabetic nephropathy -only received 1 dose of Toradol -cont to hold ARB and Metformin -did not receive IV contrast this admission -continue IVFs but decrease to 50/hr    Diabetes mellitus, type 2 -currently controlled -cont SSI -no metformin until ARF resolved    Ileus -abdomen seems more distended today -was given an enema 1/29- has had a couple of BMs  -will obtain abd films today- high pitched bowel sounds -mobilize with PT -minimize narcs/muscle relaxants as much as possible- unfortunately due to ARF can't supplement with NSAIDs -cont Reglan      Acute respiratory failure with hypoxia -suspect mechanical from abdomen distention and to some degree from pain meds -pt with body habitus c/w OSA but states does not carry this diagnosis -while in bed having more WOB that improved when pt was seated on BSC- will check CXR as a precaution -cont supportive care    Hypertension -poorly controlled -suspect combo pain and use of monotherapy -possibly poor absorption of PO Norvasc -DC Norvasc and give IV Lopressor specially with concomitant tachycardia on schedule with hold parameters    Dehydration -cont IVF -calculated water deficit as of 1/28 was 4.2 L    Leukocytosis -suspect reactive due to stress demargination -only low  grade fever    GERD (gastroesophageal reflux disease)    Spinal stenosis, lumbar region, with neurogenic claudication   Spondylolisthesis at L3-L4 level   Spondylolisthesis at L4-L5 level -per Dr. Louanne Skye   DVT prophylaxis: SCDs Code Status: Full Family Communication: No family at bedside Disposition Plan/Expected LOS: Stepdown   Procedures: Central laminectomy L3-4, L4-5,L5-S1, Transforaminal lumber interbody fusion left L3-4,L4-5, L5-S1 with pedicle screws and rods L3-S1 Local Bone Graft  1/27   Antibiotics: Ancef preoperative/intraoperative dosing only  HPI/Subjective: Patient awake initially examined while laying on right side in bed with noted and endorsed SOB- later pt reval while up on BSC and resp sxs resolved.  Objective: Blood pressure 127/64, pulse 105, temperature 99 F (37.2 C), temperature source Oral, resp. rate 12, height 5' 11" (1.803 m), weight 360 lb (163.295 kg), SpO2 93.00%.  Intake/Output Summary (Last 24 hours) at 03/14/13 1310 Last data filed at 03/14/13 1000  Gross per 24 hour  Intake 1265.5 ml  Output   2925 ml  Net -1659.5 ml     Exam: General: No acute respiratory distress-drowsy at time Lungs: Clear to auscultation bilaterally but diminished in bases without wheezes or crackles, 35% VM- refused BiPAP overnight Cardiovascular: Regular somewhat tachycardic rate and rhythm without murmur gallop or rub normal S1 and S2, no peripheral edema or JVD Abdomen: Nontender, distended, and admitted, hypoactive rarely audible bowel sounds positive, no rebound, no ascites, no appreciable mass Musculoskeletal: No significant cyanosis, clubbing of bilateral lower extremities Neurological: Awake and oriented x 3, moves all extremities x 4 without focal neurological deficits, CN 2-12 intact  Scheduled Meds:  Scheduled Meds: . allopurinol  50 mg  Oral Daily  . atorvastatin  40 mg Oral Daily  . docusate sodium  100 mg Oral BID  . insulin aspart  0-15 Units  Subcutaneous TID WC  . metoprolol  2.5 mg Intravenous Q6H  . pantoprazole  80 mg Oral Daily  . sertraline  25 mg Oral Daily  . sodium chloride  3 mL Intravenous Q12H   Continuous Infusions: . sodium chloride 250 mL (03/14/13 0945)    Data Reviewed: Basic Metabolic Panel:  Recent Labs Lab 03/11/13 1507 03/12/13 0910 03/12/13 1600 03/13/13 0236 03/14/13 0215  NA 134* 137 134* 132* 137  K 6.0* 6.4* 5.1 4.9 4.5  CL  --  102 99 98 101  CO2  --  _0 GLUCOSE 191* 213* 146* 178* 139*  BUN  --  43* 47* 53* 33*  CREATININE  --  3.22* 3.58* 3.24* 1.43*  CALCIUM  --  7.0* 7.5* 7.2* 7.5*   Liver Function Tests:  Recent Labs Lab 03/14/13 0215  AST 312*  ALT 75*  ALKPHOS 77  BILITOT 0.5  PROT 6.0  ALBUMIN 2.5*   No results found for this basename: LIPASE, AMYLASE,  in the last 168 hours No results found for this basename: AMMONIA,  in the last 168 hours CBC:  Recent Labs Lab 03/11/13 1302 03/11/13 1507 03/12/13 0910 03/13/13 0236 03/14/13 0215  WBC  --   --  23.4* 21.2* 16.6*  NEUTROABS  --   --  18.7*  --   --   HGB 11.2* 10.9* 10.5* 8.3* 7.9*  HCT 33.0* 32.0* 32.5* 25.8* 24.7*  MCV  --   --  79.9 80.4 80.5  PLT  --   --  174 154 169   Cardiac Enzymes: No results found for this basename: CKTOTAL, CKMB, CKMBINDEX, TROPONINI,  in the last 168 hours BNP (last 3 results) No results found for this basename: PROBNP,  in the last 8760 hours CBG:  Recent Labs Lab 03/13/13 0736 03/13/13 1108 03/13/13 1707 03/13/13 2221 03/14/13 0811  GLUCAP 180* 169* 173* 135* 162*    Recent Results (from the past 240 hour(s))  URINE CULTURE     Status: None   Collection Time    03/12/13  1:59 PM      Result Value Range Status   Specimen Description URINE, CATHETERIZED   Final   Special Requests NONE   Final   Culture  Setup Time     Final   Value: 03/12/2013 20:33     Performed at [place]     Final   Value: NO GROWTH     Performed  at [place]     Final   Value: NO GROWTH     Performed at [facility]   Report Status 03/13/2013 FINAL   Final     Studies:  Recent x-ray studies have been reviewed in detail by the Attending Physician  Time spent :     Erin Hearing, ANP [facility] Office  [phone] Pager [phone]  **If unable to reach the above provider after paging please contact the Flow Manager @ [phone]  On-Call/Text Page:      [email or web address]      password [email or web address]  If 7PM-7AM, please contact night-coverage [email or web address] Password [email or web address] 03/14/2013, 1:10 PM   LOS: 3 days   I have examined the patient, reviewed the chart and modified the above note which I agree with.   RXVQMG,QQPYP,PJ  [phone] 03/14/2013, 5:42 PM

## 2013-03-15 DIAGNOSIS — I10 Essential (primary) hypertension: Secondary | ICD-10-CM

## 2013-03-15 LAB — GLUCOSE, CAPILLARY
GLUCOSE-CAPILLARY: 120 mg/dL — AB (ref 70–99)
Glucose-Capillary: 127 mg/dL — ABNORMAL HIGH (ref 70–99)
Glucose-Capillary: 135 mg/dL — ABNORMAL HIGH (ref 70–99)
Glucose-Capillary: 136 mg/dL — ABNORMAL HIGH (ref 70–99)

## 2013-03-15 LAB — BASIC METABOLIC PANEL
BUN: 17 mg/dL (ref 6–23)
CO2: 25 mEq/L (ref 19–32)
Calcium: 7.8 mg/dL — ABNORMAL LOW (ref 8.4–10.5)
Chloride: 100 mEq/L (ref 96–112)
Creatinine, Ser: 1.03 mg/dL (ref 0.50–1.35)
GFR calc non Af Amer: 81 mL/min — ABNORMAL LOW (ref >90)
Glucose, Bld: 127 mg/dL — ABNORMAL HIGH (ref 70–99)
POTASSIUM: 4.7 meq/L (ref 3.7–5.3)
SODIUM: 137 meq/L (ref 137–147)

## 2013-03-15 LAB — CBC
HCT: 24 % — ABNORMAL LOW (ref 39.0–52.0)
Hemoglobin: 7.8 g/dL — ABNORMAL LOW (ref 13.0–17.0)
MCH: 26.3 pg (ref 26.0–34.0)
MCHC: 32.5 g/dL (ref 30.0–36.0)
MCV: 80.8 fL (ref 78.0–100.0)
Platelets: 220 10*3/uL (ref 150–400)
RBC: 2.97 MIL/uL — ABNORMAL LOW (ref 4.22–5.81)
RDW: 16 % — AB (ref 11.5–15.5)
WBC: 17.3 10*3/uL — AB (ref 4.0–10.5)

## 2013-03-15 MED ORDER — OXYCODONE HCL 5 MG PO TABS: 15.0000 mg | ORAL_TABLET | ORAL | Status: DC | PRN

## 2013-03-15 MED ORDER — NON FORMULARY: 12.5000 mg | Freq: Every day | Status: DC

## 2013-03-15 MED ORDER — MORPHINE SULFATE ER 30 MG PO TBCR
30.0000 mg | EXTENDED_RELEASE_TABLET | Freq: Two times a day (BID) | ORAL | Status: DC
  Administered 2013-03-15 – 2013-03-17 (×5): 30 mg via ORAL
  Filled 2013-03-15 (×5): qty 2

## 2013-03-15 MED ORDER — AMLODIPINE BESYLATE 10 MG PO TABS
10.0000 mg | ORAL_TABLET | Freq: Every day | ORAL | Status: DC
  Administered 2013-03-15 – 2013-03-17 (×3): 10 mg via ORAL
  Filled 2013-03-15 (×4): qty 1

## 2013-03-15 MED ORDER — ZOLPIDEM TARTRATE 5 MG PO TABS: 10.0000 mg | ORAL_TABLET | Freq: Every evening | ORAL | Status: DC | PRN

## 2013-03-15 MED ORDER — ALLOPURINOL 100 MG PO TABS
100.0000 mg | ORAL_TABLET | Freq: Two times a day (BID) | ORAL | Status: DC
  Administered 2013-03-15 – 2013-03-17 (×5): 100 mg via ORAL
  Filled 2013-03-15 (×8): qty 1

## 2013-03-15 MED ORDER — PANTOPRAZOLE SODIUM 40 MG PO TBEC
40.0000 mg | DELAYED_RELEASE_TABLET | Freq: Every day | ORAL | Status: DC
  Administered 2013-03-16 – 2013-03-17 (×2): 40 mg via ORAL
  Filled 2013-03-15 (×2): qty 1

## 2013-03-15 NOTE — Progress Notes (Signed)
Subjective:  Patient reports pain as mild.  No events. Eager to d/c from hospital to SNF today.  Objective:   VITALS:   Filed Vitals:   03/14/13 1856 03/14/13 2045 03/15/13 0017 03/15/13 0345  BP: 94/74 141/72 121/50 112/50  Pulse: 95 102 100 96  Temp:  98.8 F (37.1 C) 98.8 F (37.1 C) 97.7 F (36.5 C)  TempSrc:  Oral Oral Oral  Resp: _0 Height:      Weight:      SpO2: 92% 99% 99% 97%    Neurologically intact ABD soft Neurovascular intact Sensation intact distally Intact pulses distally Dorsiflexion/Plantar flexion intact Incision: dressing C/D/I and no drainage No cellulitis present Compartment soft   Lab Results  Component Value Date   WBC 17.3* 03/15/2013   HGB 7.8* 03/15/2013   HCT 24.0* 03/15/2013   MCV 80.8 03/15/2013   PLT 220 03/15/2013     Assessment/Plan: 4 Days Post-Op   Problem List Items Addressed This Visit     Respiratory   Acute respiratory failure with hypoxia     Digestive   Ileus     Endocrine   Diabetes mellitus, type 2   Relevant Medications      atorvastatin (LIPITOR) tablet 40 mg      insulin aspart (novoLOG) injection 0-15 Units      insulin aspart (novoLOG) injection 5 Units (Completed)     Genitourinary   Acute renal failure - Primary   ATN (acute tubular necrosis)    Other Visit Diagnoses   Hyperkalemia        OSA (obstructive sleep apnea)           Up with PT/OT DVT ppx - SCDs, ambulation [facility] following - appreciate their assistance Patient is eager to go to SNF today Plan to d/c to SNF today as long as hospitalist is ok with him from a medical standpoint   Marianna Payment 03/15/2013, 8:19 AM [phone]

## 2013-03-15 NOTE — Progress Notes (Signed)
[place] / ICU Consultation Note  Phillip Rocha ZOX:[id number] DOB: Mar 23, 1960 DOA: 03/11/2013 PCP: Velna Hatchet, MD  Brief narrative: 53 y/o male with HTN, HPL, DM, OSA (not on CPAP), anxiety, psoriatic arthritis who underwent Lumbar spine laminectomy/fusion on 1/27; hospitalist was consulted for evaluation of AKI HPI/Subjective: - c/o pain in back- otherwise feels well  Assessment/Plan: Active Problems:   Acute renal failure/ATN (acute tubular necrosis) -baseline renal function preop was normal: 12/0.84 -has peaked at 47/3.58-now trend down to 1.33 -suspect combo of pre renal and ATN (was on ARB/Metformin pre op and ARB was resumed briefly post op)-possibly influenced by occult diabetic nephropathy -only received 1 dose of Toradol -cont to hold ARB and Metformin -did not receive IV contrast this admission -d/c IVF now as numbers have normalized    Diabetes mellitus, type 2 -currently controlled -cont SSI    Ileus -although xray continues to show an ileus, he is tolerating liquids and continues to have BMs- will advance to solids -was given an enema 1/29 and has been having multiple BMs since      Acute respiratory failure with hypoxia -suspect mechanical from abdomen distention and to some degree from pain meds -pt with body habitus c/w OSA but states does not carry this diagnosis -resolved    Hypertension -resume oral meds and follow    Dehydration resolved d/c IVF    Leukocytosis -suspect reactive due to stress demargination -only low grade fever    GERD (gastroesophageal reflux disease)    Spinal stenosis, lumbar region, with neurogenic claudication   Spondylolisthesis at L3-L4 level   Spondylolisthesis at L4-L5 level -per Dr. Louanne Skye - have resumed MS contin today   DVT prophylaxis: SCDs Code Status: Full Family Communication: No family at bedside Disposition Plan/Expected LOS: Stepdown   Procedures: Central laminectomy L3-4,  L4-5,L5-S1, Transforaminal lumber interbody fusion left L3-4,L4-5, L5-S1 with pedicle screws and rods L3-S1 Local Bone Graft  1/27   Antibiotics: Ancef preoperative/intraoperative dosing only  Objective: Blood pressure 138/70, pulse 101, temperature 97.1 F (36.2 C), temperature source Oral, resp. rate 20, height 5' 11" (1.803 m), weight 163.295 kg (360 lb), SpO2 96.00%.  Intake/Output Summary (Last 24 hours) at 03/15/13 1613 Last data filed at 03/15/13 0800  Gross per 24 hour  Intake   1000 ml  Output   1300 ml  Net   -300 ml     Exam: General: No acute respiratory distress-drowsy at time Lungs: Clear to auscultation bilaterally but diminished in bases without wheezes or crackles - refused BiPAP overnight Cardiovascular: Regular somewhat tachycardic rate and rhythm without murmur gallop or rub normal S1 and S2, no peripheral edema or JVD Abdomen: Nontender, distended,BS+ no rebound, no ascites, no appreciable mass Musculoskeletal: No significant cyanosis, clubbing of bilateral lower extremities Neurological: Awake and oriented x 3, moves all extremities x 4 without focal neurological deficits, CN 2-12 intact  Scheduled Meds:  Scheduled Meds: . allopurinol  100 mg Oral BID  . amLODipine  10 mg Oral Daily  . atorvastatin  40 mg Oral Daily  . docusate sodium  100 mg Oral BID  . insulin aspart  0-15 Units Subcutaneous TID WC  . morphine  30 mg Oral Q12H  . [START ON 03/16/2013] pantoprazole  40 mg Oral Daily  . sertraline  25 mg Oral Daily  . sodium chloride  3 mL Intravenous Q12H   Continuous Infusions:    Data Reviewed: Basic Metabolic Panel:  Recent Labs Lab 03/12/13 0910 03/12/13  1600 03/13/13 0236 03/14/13 0215 03/15/13 0400  NA 137 134* 132* 137 137  K 6.4* 5.1 4.9 4.5 4.7  CL 102 99 98 101 100  CO2 _0 GLUCOSE 213* 146* 178* 139* 127*  BUN 43* 47* 53* 33* 17  CREATININE 3.22* 3.58* 3.24* 1.43* 1.03  CALCIUM 7.0* 7.5* 7.2* 7.5* 7.8*   Liver  Function Tests:  Recent Labs Lab 03/14/13 0215  AST 312*  ALT 75*  ALKPHOS 77  BILITOT 0.5  PROT 6.0  ALBUMIN 2.5*   No results found for this basename: LIPASE, AMYLASE,  in the last 168 hours No results found for this basename: AMMONIA,  in the last 168 hours CBC:  Recent Labs Lab 03/11/13 1507 03/12/13 0910 03/13/13 0236 03/14/13 0215 03/15/13 0400  WBC  --  23.4* 21.2* 16.6* 17.3*  NEUTROABS  --  18.7*  --   --   --   HGB 10.9* 10.5* 8.3* 7.9* 7.8*  HCT 32.0* 32.5* 25.8* 24.7* 24.0*  MCV  --  79.9 80.4 80.5 80.8  PLT  --  174 154 169 220   Cardiac Enzymes: No results found for this basename: CKTOTAL, CKMB, CKMBINDEX, TROPONINI,  in the last 168 hours BNP (last 3 results) No results found for this basename: PROBNP,  in the last 8760 hours CBG:  Recent Labs Lab 03/14/13 1257 03/14/13 1715 03/14/13 2148 03/15/13 0741 03/15/13 1215  GLUCAP 176* 132* 113* 127* 135*    Recent Results (from the past 240 hour(s))  URINE CULTURE     Status: None   Collection Time    03/12/13  1:59 PM      Result Value Range Status   Specimen Description URINE, CATHETERIZED   Final   Special Requests NONE   Final   Culture  Setup Time     Final   Value: 03/12/2013 20:33     Performed at [place]     Final   Value: NO GROWTH     Performed at [facility]   Culture     Final   Value: NO GROWTH     Performed at [facility]   Report Status 03/13/2013 FINAL   Final     Studies:  Recent x-ray studies have been reviewed in detail by the Attending Physician  Time spent :   Yoshiharu Brassell,MD [phone] 03/15/2013, 4:13 PM

## 2013-03-15 NOTE — Progress Notes (Signed)
Abdominal x-ray results called to Kathline Magic. No new orders received. Will continue to monitor.

## 2013-03-15 NOTE — Progress Notes (Signed)
Pt is refusing to wear CPAP tonight. He only wants to wear O2 while sleeping. RT told him if he changed his mind please let RN know. No distress

## 2013-03-15 NOTE — Progress Notes (Signed)
Patient examined and lab reviewed with Lonzo Candy.

## 2013-03-16 DIAGNOSIS — E875 Hyperkalemia: Secondary | ICD-10-CM

## 2013-03-16 DIAGNOSIS — N179 Acute kidney failure, unspecified: Secondary | ICD-10-CM

## 2013-03-16 DIAGNOSIS — J9600 Acute respiratory failure, unspecified whether with hypoxia or hypercapnia: Secondary | ICD-10-CM

## 2013-03-16 DIAGNOSIS — M48062 Spinal stenosis, lumbar region with neurogenic claudication: Secondary | ICD-10-CM

## 2013-03-16 DIAGNOSIS — E119 Type 2 diabetes mellitus without complications: Secondary | ICD-10-CM

## 2013-03-16 DIAGNOSIS — I10 Essential (primary) hypertension: Secondary | ICD-10-CM

## 2013-03-16 LAB — GLUCOSE, CAPILLARY
GLUCOSE-CAPILLARY: 141 mg/dL — AB (ref 70–99)
Glucose-Capillary: 141 mg/dL — ABNORMAL HIGH (ref 70–99)
Glucose-Capillary: 144 mg/dL — ABNORMAL HIGH (ref 70–99)
Glucose-Capillary: 146 mg/dL — ABNORMAL HIGH (ref 70–99)

## 2013-03-16 NOTE — Progress Notes (Signed)
Patient continues to refuse CPAP

## 2013-03-16 NOTE — Progress Notes (Signed)
[facility] PROGRESS NOTE  Phillip Rocha QQP:[id number] DOB: [birth year] DOA: 03/11/2013 PCP: Velna Hatchet, MD  Assessment/Plan:   Will sign off, call us again if needed.  Acute renal failure/ATN (acute tubular necrosis)  - Resolved, cr is now back to baseline. -has peaked at 47/3.58-now trend down to 1.o3 at baseline. -suspect combo of pre renal and ATN (was on ARB/Metformin pre op and ARB was resumed briefly post op)-possibly influenced by occult diabetic nephropathy  -only received 1 dose of Toradol  -cont to hold ARB and Metformin  -did not receive IV contrast this admission  -d/c IVF now as numbers have normalized    Diabetes mellitus, type 2  -currently controlled  -cont SSI   Ileus  Resolved -although xray continues to show an ileus, he is tolerating liquids and continues to have BMs- will advance to solids  -was given an enema 1/29 and has been having multiple BMs since    Acute respiratory failure with hypoxia  -suspect mechanical from abdomen distention and to some degree from pain meds  -pt with body habitus c/w OSA but states does not carry this diagnosis  -resolved   Hypertension  -resume oral meds and follow   Dehydration  resolved d/c IVF   Leukocytosis  -suspect reactive due to stress demargination    GERD (gastroesophageal reflux disease)  Spinal stenosis, lumbar region, with neurogenic claudication  Spondylolisthesis at L3-L4 level  Spondylolisthesis at L4-L5 level  -per Dr. Louanne Skye  - have resumed MS contin today   DVT prophylaxis: SCDs  Code Status: Full  Family Communication: No family at bedside  Disposition Plan/Expected LOS: Stepdown  Procedures:  Central laminectomy L3-4, L4-5,L5-S1, Transforaminal lumber interbody fusion left L3-4,L4-5, L5-S1 with pedicle screws and rods L3-S1 Local Bone Graft 1/27      Code Status: Full code Family Communication: *No family at bedside Disposition Plan: Home when  stable     Antibiotics: Ancef preoperative/intraoperative dosing only   HPI/Subjective: Patient seen and examined, no new complaints.  Objective: Filed Vitals:   03/15/13 2201  BP: 127/81  Pulse: 96  Temp: 97.5 F (36.4 C)  Resp: 20    Intake/Output Summary (Last 24 hours) at 03/16/13 1154 Last data filed at 03/16/13 0855  Gross per 24 hour  Intake      0 ml  Output      0 ml  Net      0 ml   Filed Weights   03/11/13 1300 03/12/13 1500  Weight: 162.66 kg (358 lb 9.6 oz) 163.295 kg (360 lb)    Exam:  Physical Exam: Head: Normocephalic, atraumatic.  Eyes: No signs of jaundice, EOMI Nose: Mucous membranes dry.  Throat: Oropharynx nonerythematous, no exudate appreciated.  Neck: supple,No deformities, masses, or tenderness noted. Lungs: Normal respiratory effort. B/L Clear to auscultation, no crackles or wheezes.  Heart: Regular RR. S1 and S2 normal  Abdomen: BS normoactive. Soft, Nondistended, non-tender.  Extremities: No pretibial edema, no erythema   Data Reviewed: Basic Metabolic Panel:  Recent Labs Lab 03/12/13 0910 03/12/13 1600 03/13/13 0236 03/14/13 0215 03/15/13 0400  NA 137 134* 132* 137 137  K 6.4* 5.1 4.9 4.5 4.7  CL 102 99 98 101 100  CO2 _0 GLUCOSE 213* 146* 178* 139* 127*  BUN 43* 47* 53* 33* 17  CREATININE 3.22* 3.58* 3.24* 1.43* 1.03  CALCIUM 7.0* 7.5* 7.2* 7.5* 7.8*   Liver Function Tests:  Recent Labs Lab 03/14/13 0215  AST 312*  ALT  75*  ALKPHOS 77  BILITOT 0.5  PROT 6.0  ALBUMIN 2.5*   No results found for this basename: LIPASE, AMYLASE,  in the last 168 hours No results found for this basename: AMMONIA,  in the last 168 hours CBC:  Recent Labs Lab 03/11/13 1507 03/12/13 0910 03/13/13 0236 03/14/13 0215 03/15/13 0400  WBC  --  23.4* 21.2* 16.6* 17.3*  NEUTROABS  --  18.7*  --   --   --   HGB 10.9* 10.5* 8.3* 7.9* 7.8*  HCT 32.0* 32.5* 25.8* 24.7* 24.0*  MCV  --  79.9 80.4 80.5 80.8  PLT  --   174 154 169 220   Cardiac Enzymes: No results found for this basename: CKTOTAL, CKMB, CKMBINDEX, TROPONINI,  in the last 168 hours BNP (last 3 results) No results found for this basename: PROBNP,  in the last 8760 hours CBG:  Recent Labs Lab 03/15/13 0741 03/15/13 1215 03/15/13 1728 03/15/13 2158 03/16/13 0733  GLUCAP 127* 135* 120* 136* 141*    Recent Results (from the past 240 hour(s))  URINE CULTURE     Status: None   Collection Time    03/12/13  1:59 PM      Result Value Range Status   Specimen Description URINE, CATHETERIZED   Final   Special Requests NONE   Final   Culture  Setup Time     Final   Value: 03/12/2013 20:33     Performed at [facility]     Final   Value: NO GROWTH     Performed at [facility]   Culture     Final   Value: NO GROWTH     Performed at [facility]   Report Status 03/13/2013 FINAL   Final     Studies: Dg Abd Acute W/chest  03/14/2013   CLINICAL DATA:  Ileus versus small-bowel obstruction, significant abdominal distention  EXAM: ACUTE ABDOMEN SERIES (ABDOMEN 2 VIEW & CHEST 1 VIEW)  COMPARISON:  03/12/2013  FINDINGS: Heart size upper normal.  Mild bibasilar atelectasis.  No free air.  Study limited by body habitus. There are air-fluid levels throughout small and large bowel with mild small bowel distention to about 5 cm. Gas is seen as far as the transverse colon. There is no gas in the rectum.  IMPRESSION: Similar to prior study. Ileus is again considered to be more likely. A very low grade distal small bowel obstruction is not excluded.   Electronically Signed   By: Skipper Cliche M.D.   On: 03/14/2013 20:28    Scheduled Meds: . allopurinol  100 mg Oral BID  . amLODipine  10 mg Oral Daily  . atorvastatin  40 mg Oral Daily  . docusate sodium  100 mg Oral BID  . insulin aspart  0-15 Units Subcutaneous TID WC  . morphine  30 mg Oral Q12H  . pantoprazole  40 mg Oral Daily  . sertraline  25 mg Oral  Daily  . sodium chloride  3 mL Intravenous Q12H   Continuous Infusions:   Active Problems:   GERD (gastroesophageal reflux disease)   Hypertension   Diabetes mellitus, type 2   Spinal stenosis, lumbar region, with neurogenic claudication   Spondylolisthesis at L3-L4 level   Spondylolisthesis at L4-L5 level   Acute renal failure   Ileus   Dehydration   ATN (acute tubular necrosis)   Acute respiratory failure with hypoxia   Leukocytosis    Time spent: 25  min    [facility] S  [facility] Pager [phone]*. If 7PM-7AM, please contact night-coverage at [email or web address], password [facility] 03/16/2013, 11:54 AM  LOS: 5 days

## 2013-03-17 DIAGNOSIS — D62 Acute posthemorrhagic anemia: Secondary | ICD-10-CM | POA: Diagnosis not present

## 2013-03-17 LAB — GLUCOSE, CAPILLARY
GLUCOSE-CAPILLARY: 137 mg/dL — AB (ref 70–99)
GLUCOSE-CAPILLARY: 145 mg/dL — AB (ref 70–99)
Glucose-Capillary: 134 mg/dL — ABNORMAL HIGH (ref 70–99)

## 2013-03-17 MED ORDER — INSULIN ASPART 100 UNIT/ML ~~LOC~~ SOLN: 0.0000 [IU] | Freq: Three times a day (TID) | SUBCUTANEOUS | Status: DC

## 2013-03-17 MED ORDER — OXYCODONE HCL 15 MG PO TABS: 15.0000 mg | ORAL_TABLET | ORAL | Status: DC | PRN

## 2013-03-17 MED ORDER — MORPHINE SULFATE ER 30 MG PO TBCR: 30.0000 mg | EXTENDED_RELEASE_TABLET | Freq: Two times a day (BID) | ORAL | Status: DC

## 2013-03-17 MED ORDER — DSS 100 MG PO CAPS: 100.0000 mg | ORAL_CAPSULE | Freq: Two times a day (BID) | ORAL | Status: DC

## 2013-03-17 MED ORDER — METHOCARBAMOL 500 MG PO TABS: 500.0000 mg | ORAL_TABLET | Freq: Four times a day (QID) | ORAL | Status: DC | PRN

## 2013-03-17 MED ORDER — POLYETHYLENE GLYCOL 3350 17 G PO PACK: 17.0000 g | PACK | Freq: Every day | ORAL | Status: DC | PRN

## 2013-03-17 NOTE — Progress Notes (Signed)
Patient ID: Phillip Rocha, male   DOB: 08-01-1960, 53 y.o.   MRN: [id number] Subjective: 6 Days Post-Op Procedure(s) (LRB): Central laminectomy L3-4, L4-5, Bilateral hemi-laminectomy L5-S1, Transforaminal lumber interbody fusion left L3-4,L4-5, L5-S1 with pedicle screws and rods L3-S1 Local Bone Graft, Conform DBX (N/A)Awake, alert and oriented x 4. I've got pain, I live alone and I can't do anything. Internal medicine has been extremely helpful in the Perioperative management of patient's Acute renal insufficiency and ileus. Patient reports he has difficulty sleeping in bed and is sleeping in the recliner or with the [name]. ICU notes desaturation when he is lying down, sleep apnea and element of pickwickian mechanism of pulmonary dsyfunction. He refuses the use of CPAP and is using O2 at night to assist him with desanturation. Last BM 2 days ago.   Patient reports pain as marked.    Objective:   VITALS:  Temp:  [98.5 F (36.9 C)-99.2 F (37.3 C)] 98.5 F (36.9 C) (02/02 0641) Pulse Rate:  [78-105] 100 (02/02 0641) Resp:  [19-20] 19 (02/02 0641) BP: (137-164)/(60-90) 141/70 mmHg (02/02 0641) SpO2:  [99 %-100 %] 99 % (02/02 0641)  Neurologically intact ABD soft Sensation intact distally Intact pulses distally Incision: no drainage No cellulitis present   LABS  Recent Labs  03/15/13 0400  HGB 7.8*  WBC 17.3*  PLT 220    Recent Labs  03/15/13 0400  NA 137  K 4.7  CL 100  CO2 25  BUN 17  CREATININE 1.03  GLUCOSE 127*   No results found for this basename: LABPT, INR,  in the last 72 hours   Assessment/Plan: 6 Days Post-Op Procedure(s) (LRB): Central laminectomy L3-4, L4-5, Bilateral hemi-laminectomy L5-S1, Transforaminal lumber interbody fusion left L3-4,L4-5, L5-S1 with pedicle screws and rods L3-S1 Local Bone Graft, Conform DBX (N/A) Acute blood loss anemia, humira bone marrow suppression, Acute renal insufficiency, ATN and dehydration and medication. Cr  has returned to 1.03 Diabetes mellitus, diet and insulin controlled.  Advance diet Up with therapy D/C IV fluids Discharge to SNF Like chronic pulmonary condition and it is the major concern in terms of his medical condition. Phillip Rocha E 03/17/2013, 8:24 AM

## 2013-03-17 NOTE — Progress Notes (Signed)
CSW (Clinical Education officer, museum) prepared pt dc packet and placed with shadow chart. CSW received call from [facility] care asking for pt to dc after 5pm as they are waiting on pt bariatric bed to be delivered. CSW left phone number for after hours non-emergent ambulance transport with pt nurse and informed to please call at 5:30pm. Pt pt nurse, and facility informed. CSW signing off.  [place], [place]

## 2013-03-17 NOTE — Discharge Summary (Signed)
Physician Discharge Summary  Patient ID: Phillip Rocha MRN: [id number] DOB/AGE: 55/17/1962 55 y.o.  Admit date: 03/11/2013 Discharge date: 03/17/2013  Admission Diagnoses:  Spinal stenosis, lumbar region, with neurogenic claudication Lumbar spinal stenosis L3-S1, Degenerative spondylolisthesis L3-4,L4-5, Central herniated nucleus pulposus L5-S1  Discharge Diagnoses:  Principal Problem:   Spinal stenosis, lumbar region, with neurogenic claudication Active Problems:   Acute blood loss anemia   GERD (gastroesophageal reflux disease)   Hypertension   Diabetes mellitus, type 2   Spondylolisthesis at L3-L4 level   Spondylolisthesis at L4-L5 level   Acute renal failure   Ileus   Dehydration   ATN (acute tubular necrosis)   Acute respiratory failure with hypoxia   Leukocytosis   Postoperative anemia due to acute blood loss   Past Medical History  Diagnosis Date  . Hypertension   . Anxiety   . Shortness of breath   . GERD (gastroesophageal reflux disease)   . Arthritis   . Psoriatic arthritis     takes Humira every 2 weeks  . Diabetes mellitus without complication     NIDDM x 2 years  . Degenerative arthritis     knees and spine  . Psoriasis   . Sleep apnea     possible; not tested  . Morbid obesity   . Claustrophobia     Surgeries: Procedure(s): Central laminectomy L3-4, L4-5, Bilateral hemi-laminectomy L5-S1, Transforaminal lumber interbody fusion left L3-4,L4-5, L5-S1 with pedicle screws and rods L3-S1 Local Bone Graft, Conform DBX on 03/11/2013   Consultants (if any):  [facility]  Discharged Condition: Improved  Hospital Course: Phillip Rocha is an 53 y.o. male who was admitted 03/11/2013 with a diagnosis of Spinal stenosis, lumbar region, with neurogenic claudication and went to the operating room on 03/11/2013 and underwent the above named procedures.    He was given perioperative antibiotics:      Anti-infectives   Start     Dose/Rate Route  Frequency Ordered Stop   03/11/13 2200  ceFAZolin (ANCEF) IVPB 1 g/50 mL premix     1 g 100 mL/hr over 30 Minutes Intravenous Every 8 hours 03/11/13 2112 03/12/13 0621   03/11/13 1245  ceFAZolin (ANCEF) IVPB 1 g/50 mL premix  Status:  Discontinued     1 g 100 mL/hr over 30 Minutes Intravenous To Surgery 03/11/13 1243 03/11/13 2032   03/11/13 0600  ceFAZolin (ANCEF) 3 g in dextrose 5 % 50 mL IVPB     3 g 160 mL/hr over 30 Minutes Intravenous On call to O.R. 03/10/13 1404 03/11/13 1200    .Acute renal failure/ATN (acute tubular necrosis)  Baseline renal function preop was normal: 12/0.84. After extensive procedure with prolonged anesthesia pt developed ARF.Hospitalist consulted for treatment.  Pt rehydrated and offending meds held.  Pt remained in ICU for proper monitoring and close observation of fluid balance. BUN/Cr peaked at 47/3.58-now trend down to 1.33 . IVF discontinued and pts intake improved. Metformin and ARB continuing to be held Suspect combo of pre renal and ATN (was on ARB/Metformin pre op and ARB was resumed briefly post op)-possibly influenced by occult diabetic nephropathy. Hyperkalemia resolved with kayexalate. Diabetes mellitus, type 2  currently controlled  cont SSI. No metformin.  Meal coverage only.  To be reassessed by his PCP at follow up. CBG monitoring 4 times daily before meals and at bedtime. No HS coverage needed. Continue modified carb diet Ileus  xrays with non obstructive ileus. Treated with limited diet and Reglan as well as stool softeners and laxatives.  Pt  tolerating liquids and continues to have BMs. advanced to modified carb diet. Pt was given an enema 1/29 and has been having multiple BMs since. Will need continuation of stool softener regularly and may require further bowel stimulants while on narcotic medication. Acute respiratory failure with hypoxia  Suspectedly due to  Mechanical obstuction from abdominal distention and to some degree from pain  meds. Pt with body habitus c/w OSA but states does not carry this diagnosis.  Although acute symptoms resolved pt continues to have shortness of breath at night and while trying to sleep or lie flat.  Pt will continue on nasal O2 at night and as needed for hypoxia.  Should have follow up with PCP after discharge to arrange for appropriate sleep study for evaluation of OSA and treatment. Hypertension  Resume oral meds and followed with values acceptable  Dehydration  Resolved.  Leukocytosis  suspected reactive due to stress demargination.  Pt on Humira and previous WBC counts elevated.  Pre op value at 12.8.  Will hold Humira treatment for at least one month post op Acute Blood loss anemia:  Pt received cell saver blood and 2 units of autologous blood intra op.  He has not required further transfusion post op and is stabilized at hgb 7.9.  Currently asymptomatic. No Iron supplement used to avoid further bowel complications.  May consider iron once bowel function completely stable.    Spinal stenosis, lumbar region, with neurogenic claudication  Spondylolisthesis at L3-L4 level  Spondylolisthesis at L4-L5 level   Pt progressed slowly with ambulation with PT and OT.  Used walker for WBAT bilateral LEs.  Aspen brace utilized when out of bed for ambulation.  Will not need brace when in bed.   Pain controlled with MS Contin, OXY IR and robaxin.   Dressing changed daily and wound healing well.  Will continue dressing daily for a total of 2 weeks post op then dressing may be discontinued.  No stitches or staples to be removed.  dermabond on superficial wound will exfoliate spontaneously. No bending, lifting or twisting.  Should have PT and OT daily for progressive ambulation and ADLs.   Pt requires short term NHP for rehabilitation as he has no assistance at home and is not safe for independent care yet. - have resumed MS contin today He was given sequential compression devices, early ambulation for DVT  prophylaxis. He benefited maximally from the hospital stay.  COD: stable.  Recent vital signs:  Filed Vitals:   03/17/13 0641  BP: 141/70  Pulse: 100  Temp: 98.5 F (36.9 C)  Resp: 19    Recent laboratory studies:  Lab Results  Component Value Date   HGB 7.8* 03/15/2013   HGB 7.9* 03/14/2013   HGB 8.3* 03/13/2013   Lab Results  Component Value Date   WBC 17.3* 03/15/2013   PLT 220 03/15/2013   Lab Results  Component Value Date   INR 1.16 10/04/2012   Lab Results  Component Value Date   NA 137 03/15/2013   K 4.7 03/15/2013   CL 100 03/15/2013   CO2 25 03/15/2013   BUN 17 03/15/2013   CREATININE 1.03 03/15/2013   GLUCOSE 127* 03/15/2013    Discharge Medications:     Medication List    STOP taking these medications       cyclobenzaprine 10 MG tablet  Commonly known as:  FLEXERIL     diphenhydramine-acetaminophen 25-500 MG Tabs  Commonly known as:  TYLENOL PM  HUMIRA 40 MG/0.8ML injection  Generic drug:  adalimumab     losartan 50 MG tablet  Commonly known as:  COZAAR     metFORMIN 500 MG tablet  Commonly known as:  GLUCOPHAGE      TAKE these medications       allopurinol 100 MG tablet  Commonly known as:  ZYLOPRIM  Take 100 mg by mouth 2 (two) times daily.     amLODipine 10 MG tablet  Commonly known as:  NORVASC  Take 10 mg by mouth daily.     atorvastatin 40 MG tablet  Commonly known as:  LIPITOR  Take 40 mg by mouth daily.     DSS 100 MG Caps  Take 100 mg by mouth 2 (two) times daily.     insulin aspart 100 UNIT/ML injection  Commonly known as:  novoLOG  Inject 0-15 Units into the skin 3 (three) times daily with meals.     methocarbamol 500 MG tablet  Commonly known as:  ROBAXIN  Take 1 tablet (500 mg total) by mouth every 6 (six) hours as needed for muscle spasms.     morphine 30 MG 12 hr tablet  Commonly known as:  MS CONTIN  Take 1 tablet (30 mg total) by mouth every 12 (twelve) hours.     NASAL ALLERGY NA  Place 1-2 sprays into  both nostrils 3 (three) times daily as needed (allergies).     omeprazole 40 MG capsule  Commonly known as:  PRILOSEC  Take 40 mg by mouth daily.     oxyCODONE 15 MG immediate release tablet  Commonly known as:  ROXICODONE  Take 1 tablet (15 mg total) by mouth every 4 (four) hours as needed for severe pain.     polyethylene glycol packet  Commonly known as:  MIRALAX / GLYCOLAX  Take 17 g by mouth daily as needed for mild constipation.     sertraline 25 MG tablet  Commonly known as:  ZOLOFT  Take 25 mg by mouth daily.        Diagnostic Studies: Dg Lumbar Spine Complete  03/11/2013   CLINICAL DATA:  L3-S1 spinal fusion  EXAM: LUMBAR SPINE - COMPLETE 4+ VIEW  COMPARISON:  MRI lumbar spine 01/14/2013  FINDINGS: Four digital C-arm fluoroscopic images submitted.  Prior MRI labeled with 5 lumbar vertebrae.  Current exam demonstrates presence of bilateral pedicle screws and bars extending from L3-S1.  Disc prostheses are present at the L3-L4, L4-L5 and L5-S1 disc spaces on the lateral view, difficult to identify in position on the PA view.  Bones appear demineralized.  Mild retrolisthesis at L5-S1 similar to prior MR.  No gross evidence of fracture or additional subluxation.  IMPRESSION: L3-S1 fusion as above.   Electronically Signed   By: Lavonia Dana M.D.   On: 03/11/2013 17:44   US Renal  03/13/2013   CLINICAL DATA:  Renal insufficiency.  EXAM: RENAL/URINARY TRACT ULTRASOUND COMPLETE  COMPARISON:  DG ABD 2 VIEWS dated 03/12/2013  FINDINGS: Right Kidney:  Length: 14.4 cm. Punctate 5 mm echogenic focus upper pole right kidney. This could represent a tiny focus of fat in a angiomyolipoma or focus of calcification. No associated mass. No hydronephrosis. Echogenicity normal.  Left Kidney:  Length: 15 cm. 3 mm nonobstructing caliceal stone midpole. No hydronephrosis. Echogenicity normal.  Bladder:  Nondistended.  Foley catheter in bladder.  IMPRESSION: 1. Bilateral diffuse renal enlargement. This could  be secondary to an acute inflammatory or ischemic process. To further evaluate the  renal vasculature particularly the renal veins renal color-flow duplex Doppler exam is suggested. 2. No hydronephrosis. The bladder is nondistended. Foley catheter in bladder.   Electronically Signed   By: [name]  Register   On: 03/13/2013 07:17   Dg Chest Port 1 View  03/12/2013   CLINICAL DATA:  Shortness of breath, chest pain, decreased O2 sat.  EXAM: PORTABLE CHEST - 1 VIEW  COMPARISON:  DG CHEST 2 VIEW dated 09/23/2012  FINDINGS: Low lung volumes. Cardiac silhouette is enlarged. There is no evidence focal infiltrates, effusions, nor edema. The osseous structures unremarkable. The heart size and mediastinal contours are within normal limits. Both lungs are clear. The visualized skeletal structures are unremarkable.  IMPRESSION: No evidence of acute cardiopulmonary disease.   Electronically Signed   By: Margaree Mackintosh M.D.   On: 03/12/2013 14:03   Dg Abd 2 Views  03/12/2013   CLINICAL DATA:  Postop abdominal pain, distension  EXAM: ABDOMEN - 2 VIEW  COMPARISON:  None.  FINDINGS: Scattered air throughout the bowel. Small air-fluid levels diffusely on the decubitus view. No gross free air demonstrated. Postop changes of the lumbar spine. Minor basilar atelectasis. Heart is enlarged.  IMPRESSION: Nonspecific small bowel distention with scattered air-fluid levels. Suspect mild ileus. No free air.  Basilar atelectasis   Electronically Signed   By: Daryll Brod M.D.   On: 03/12/2013 21:23   Dg Abd Acute W/chest  03/14/2013   CLINICAL DATA:  Ileus versus small-bowel obstruction, significant abdominal distention  EXAM: ACUTE ABDOMEN SERIES (ABDOMEN 2 VIEW & CHEST 1 VIEW)  COMPARISON:  03/12/2013  FINDINGS: Heart size upper normal.  Mild bibasilar atelectasis.  No free air.  Study limited by body habitus. There are air-fluid levels throughout small and large bowel with mild small bowel distention to about 5 cm. Gas is seen as far  as the transverse colon. There is no gas in the rectum.  IMPRESSION: Similar to prior study. Ileus is again considered to be more likely. A very low grade distal small bowel obstruction is not excluded.   Electronically Signed   By: Skipper Cliche M.D.   On: 03/14/2013 20:28   Dg C-arm Gt 120 Min  03/11/2013   EXAM: DG C-ARM GT 120 MIN  FLUOROSCOPY TIME:  1 min 10 seconds  C-arm fluoroscopic images were obtained intraoperatively and submitted for post operative interpretation. Please see the performing provider's procedural report for the fluoroscopy time utilized.   Electronically Signed   By: Rozetta Nunnery M.D.   On: 03/11/2013 17:48    Disposition: 03-Skilled Nursing Facility  Discharge Orders   Future Orders Complete By Expires   Call MD / Call 911  As directed    Comments:     If you experience chest pain or shortness of breath, CALL 911 and be transported to the hospital emergency room.  If you develope a fever above 101 F, pus (white drainage) or increased drainage or redness at the wound, or calf pain, call your surgeon's office.   Discharge instructions  As directed    Comments:     Call if there is increasing drainage, fever greater than 101.5, severe head aches, and worsening nausea or light sensitivity. If shortness of breath, bloody cough or chest tightness or pain go to an emergency room. No lifting greater than 10 lbs. Avoid bending, stooping and twisting. Use brace when out of bed.  Pt may have brace removed if he develops difficulty breathing or abdominal distention.  Dressing may  be changed daily or as needed and be discontinued 2 weeks post op.  Incision may be wet for showering. PT and OT daily for progressive ambulation and ADLs.  Pt should wear the brace when ambulating unless it is causing shortness of breath.    Please see above discussion for further instructions related to medical care. Follow up with PCP should be made for 1-2 weeks after discharge from the  hospital for follow up of medical issues addressed during the hospital stay.  Follow-up Information   Follow up with NITKA,JAMES E, MD In 2 weeks.   Specialty:  Orthopedic Surgery   Contact information:   [place] [address] [phone]        Signed: Epimenio Foot 03/17/2013, 10:06 AM

## 2013-03-17 NOTE — Progress Notes (Signed)
PT Cancellation Note  Patient Details Name: Phillip Rocha MRN: [id number] DOB: May 05, 1960   Cancelled Treatment:    Reason Eval/Treat Not Completed: Patient declined, no reason specified  Noted for dc to SNF later today   [place] 03/17/2013, 4:06 PM

## 2013-03-18 LAB — TYPE AND SCREEN
ABO/RH(D): B POS
Antibody Screen: NEGATIVE
Unit division: 0
Unit division: 0

## 2013-03-24 NOTE — Discharge Summary (Signed)
Patient d/c summary note and lab reviewed.

## 2013-04-16 ENCOUNTER — Encounter: Payer: Self-pay | Admitting: Podiatry

## 2013-04-16 ENCOUNTER — Ambulatory Visit (INDEPENDENT_AMBULATORY_CARE_PROVIDER_SITE_OTHER): Payer: Commercial Managed Care - HMO | Admitting: Podiatry

## 2013-04-16 VITALS — BP 128/62 | HR 99 | Resp 12

## 2013-04-16 DIAGNOSIS — M79609 Pain in unspecified limb: Secondary | ICD-10-CM

## 2013-04-16 DIAGNOSIS — B351 Tinea unguium: Secondary | ICD-10-CM

## 2013-04-16 NOTE — Patient Instructions (Signed)
Apply skin lotion to legs and feet daily   Diabetes and Foot Care Diabetes may cause you to have problems because of poor blood supply (circulation) to your feet and legs. This may cause the skin on your feet to become thinner, break easier, and heal more slowly. Your skin may become dry, and the skin may peel and crack. You may also have nerve damage in your legs and feet causing decreased feeling in them. You may not notice minor injuries to your feet that could lead to infections or more serious problems. Taking care of your feet is one of the most important things you can do for yourself.  HOME CARE INSTRUCTIONS  Wear shoes at all times, even in the house. Do not go barefoot. Bare feet are easily injured.  Check your feet daily for blisters, cuts, and redness. If you cannot see the bottom of your feet, use a mirror or ask someone for help.  Wash your feet with warm water (do not use hot water) and mild soap. Then pat your feet and the areas between your toes until they are completely dry. Do not soak your feet as this can dry your skin.  Apply a moisturizing lotion or petroleum jelly (that does not contain alcohol and is unscented) to the skin on your feet and to dry, brittle toenails. Do not apply lotion between your toes.  Trim your toenails straight across. Do not dig under them or around the cuticle. File the edges of your nails with an emery board or nail file.  Do not cut corns or calluses or try to remove them with medicine.  Wear clean socks or stockings every day. Make sure they are not too tight. Do not wear knee-high stockings since they may decrease blood flow to your legs.  Wear shoes that fit properly and have enough cushioning. To break in new shoes, wear them for just a few hours a day. This prevents you from injuring your feet. Always look in your shoes before you put them on to be sure there are no objects inside.  Do not cross your legs. This may decrease the blood  flow to your feet.  If you find a minor scrape, cut, or break in the skin on your feet, keep it and the skin around it clean and dry. These areas may be cleansed with mild soap and water. Do not cleanse the area with peroxide, alcohol, or iodine.  When you remove an adhesive bandage, be sure not to damage the skin around it.  If you have a wound, look at it several times a day to make sure it is healing.  Do not use heating pads or hot water bottles. They may burn your skin. If you have lost feeling in your feet or legs, you may not know it is happening until it is too late.  Make sure your health care provider performs a complete foot exam at least annually or more often if you have foot problems. Report any cuts, sores, or bruises to your health care provider immediately. SEEK MEDICAL CARE IF:   You have an injury that is not healing.  You have cuts or breaks in the skin.  You have an ingrown nail.  You notice redness on your legs or feet.  You feel burning or tingling in your legs or feet.  You have pain or cramps in your legs and feet.  Your legs or feet are numb.  Your feet always feel cold.  SEEK IMMEDIATE MEDICAL CARE IF:   There is increasing redness, swelling, or pain in or around a wound.  There is a red line that goes up your leg.  Pus is coming from a wound.  You develop a fever or as directed by your health care provider.  You notice a bad smell coming from an ulcer or wound. Document Released: 01/28/2000 Document Revised: 10/02/2012 Document Reviewed: 07/09/2012 [facility] Patient Information 2014 [place].

## 2013-04-17 NOTE — Progress Notes (Signed)
Patient ID: Phillip Rocha, male   DOB: 1960/05/04, 53 y.o.   MRN: [id number]  Subjective: This patient presents complaining of painful mycotic toenails in request debridement. The last visit for this similar service was on 09/20/2012.  He is diabetic. He has had recent back surgery which is recovering from an is in extreme pain from back surgery.  Objective: Patient transfers from wheelchair the treatment chair. All 10 toenails are extremely elongated, hypertrophic, discolored and tender to palpation.  Assessment: Neglected symptomatic onychomycoses x10  Plan: Nails x10 are debrided back without any bleeding. Patient advised to return at three-month intervals.

## 2013-04-21 ENCOUNTER — Ambulatory Visit: Payer: Medicare Other | Admitting: Podiatry

## 2013-07-17 ENCOUNTER — Other Ambulatory Visit (HOSPITAL_COMMUNITY): Payer: Self-pay | Admitting: Internal Medicine

## 2013-07-17 DIAGNOSIS — R609 Edema, unspecified: Secondary | ICD-10-CM

## 2013-07-21 ENCOUNTER — Ambulatory Visit (HOSPITAL_COMMUNITY)
Admission: RE | Admit: 2013-07-21 | Discharge: 2013-07-21 | Disposition: A | Discharge status: Home / Self Care | Payer: Medicare HMO | Source: Ambulatory Visit | Attending: Cardiovascular Disease | Admitting: Cardiovascular Disease

## 2013-07-21 DIAGNOSIS — R609 Edema, unspecified: Secondary | ICD-10-CM | POA: Insufficient documentation

## 2013-07-21 DIAGNOSIS — I369 Nonrheumatic tricuspid valve disorder, unspecified: Secondary | ICD-10-CM

## 2013-07-21 NOTE — Progress Notes (Signed)
2D Echo Performed 07/21/2013    Marygrace Drought, RCS

## 2013-07-23 ENCOUNTER — Ambulatory Visit (INDEPENDENT_AMBULATORY_CARE_PROVIDER_SITE_OTHER): Payer: Commercial Managed Care - HMO | Admitting: Podiatry

## 2013-07-23 ENCOUNTER — Encounter: Payer: Self-pay | Admitting: Podiatry

## 2013-07-23 VITALS — BP 147/78 | HR 87 | Resp 17 | Ht 71.0 in | Wt 334.0 lb

## 2013-07-23 DIAGNOSIS — B351 Tinea unguium: Secondary | ICD-10-CM

## 2013-07-23 DIAGNOSIS — M79609 Pain in unspecified limb: Secondary | ICD-10-CM

## 2013-07-24 NOTE — Progress Notes (Signed)
Patient ID: Phillip Rocha, male   DOB: [birth year], 53 y.o.   MRN: [id number]  Subjective: Orientated x3 black male presents for ongoing debridement of painful toenails.  Objective: Patient transfers from wheelchair the treatment chair All 10 toenails are elongated, discolored, brittle, hypertrophic  Assessment: Symptomatic onychomycoses x10 Diabetic type II  Plan: Nails x10 are debrided without a bleeding  Reappoint at three-month intervals

## 2013-08-01 ENCOUNTER — Other Ambulatory Visit (HOSPITAL_COMMUNITY): Payer: Self-pay | Admitting: Orthopedic Surgery

## 2013-08-11 ENCOUNTER — Encounter (HOSPITAL_COMMUNITY): Payer: Self-pay | Admitting: Pharmacy Technician

## 2013-08-12 ENCOUNTER — Other Ambulatory Visit (HOSPITAL_COMMUNITY): Payer: Self-pay | Admitting: *Deleted

## 2013-08-12 NOTE — Pre-Procedure Instructions (Addendum)
Phillip Rocha  08/12/2013   Your procedure is scheduled on:  Thursday, August 21, 2013 at 11:25 AM.   Report to [facility] Entrance "A" Admitting Office at 9:25 AM.   Call this number if you have problems the morning of surgery: [phone]   Remember:   Do not eat food or drink liquids after midnight Wednesday, 08/20/13.   Take these medicines the morning of surgery with A SIP OF WATER: allopurinol (ZYLOPRIM), amLODipine (NORVASC), morphine (MS CONTIN), oxycodone (ROXICODONE) 30 MG, sertraline (ZOLOFT), omeprazole (PRILOSEC), methocarbamol (ROBAXIN) - if needed         Take all meds as ordred until day of surgery except as instructed below or per dr        Phillip Rocha all herbel meds, nsaids (aleve,naproxen,advil,ibuprofen) 5 days prior to surgery including vitamins,aspirin,diclofenac,pepto bismol, allegra d       NO DIABETIC MED AM OF SURGERY.    Do not wear jewelry.  Do not wear lotions, powders, or cologne. You may wear deodorant.  Men may shave face and neck.  Do not bring valuables to the hospital.  [facility] is not responsible                  for any belongings or valuables.               Contacts, dentures or bridgework may not be worn into surgery.  Leave suitcase in the car. After surgery it may be brought to your room.  For patients admitted to the hospital, discharge time is determined by your                treatment team.                 Special Instructions: [place] - Preparing for Surgery  Before surgery, you can play an important role.  Because skin is not sterile, your skin needs to be as free of germs as possible.  You can reduce the number of germs on you skin by washing with CHG (chlorahexidine gluconate) soap before surgery.  CHG is an antiseptic cleaner which kills germs and bonds with the skin to continue killing germs even after washing.  Please DO NOT use if you have an allergy to CHG or antibacterial soaps.  If your skin becomes reddened/irritated  stop using the CHG and inform your nurse when you arrive at Short Stay.  Do not shave (including legs and underarms) for at least 48 hours prior to the first CHG shower.  You may shave your face.  Please follow these instructions carefully:   1.  Shower with CHG Soap the night before surgery and the                                morning of Surgery.  2.  If you choose to wash your hair, wash your hair first as usual with your       normal shampoo.  3.  After you shampoo, rinse your hair and body thoroughly to remove the                      Shampoo.  4.  Use CHG as you would any other liquid soap.  You can apply chg directly       to the skin and wash gently with scrungie or a clean washcloth.  5.  Apply the CHG Soap to  your body ONLY FROM THE NECK DOWN.        Do not use on open wounds or open sores.  Avoid contact with your eyes, ears, mouth and genitals (private parts).  Wash genitals (private parts) with your normal soap.  6.  Wash thoroughly, paying special attention to the area where your surgery        will be performed.  7.  Thoroughly rinse your body with warm water from the neck down.  8.  DO NOT shower/wash with your normal soap after using and rinsing off       the CHG Soap.  9.  Pat yourself dry with a clean towel.            10.  Wear clean pajamas.            11.  Place clean sheets on your bed the night of your first shower and do not        sleep with pets.  Day of Surgery  Do not apply any lotions the morning of surgery.  Please wear clean clothes to the hospital/surgery center.     Please read over the following fact sheets that you were given: Pain Booklet, Coughing and Deep Breathing, Blood Transfusion Information, MRSA Information and Surgical Site Infection Prevention

## 2013-08-13 ENCOUNTER — Encounter (HOSPITAL_COMMUNITY): Payer: Self-pay

## 2013-08-13 ENCOUNTER — Encounter (HOSPITAL_COMMUNITY)
Admission: RE | Admit: 2013-08-13 | Discharge: 2013-08-13 | Disposition: A | Discharge status: Home / Self Care | Payer: Medicare HMO | Source: Ambulatory Visit | Attending: Orthopedic Surgery | Admitting: Orthopedic Surgery

## 2013-08-13 DIAGNOSIS — Z01812 Encounter for preprocedural laboratory examination: Secondary | ICD-10-CM | POA: Insufficient documentation

## 2013-08-13 HISTORY — DX: Anemia, unspecified: D64.9

## 2013-08-13 LAB — BASIC METABOLIC PANEL
Anion gap: 14 (ref 5–15)
BUN: 12 mg/dL (ref 6–23)
CALCIUM: 8.8 mg/dL (ref 8.4–10.5)
CO2: 26 meq/L (ref 19–32)
Chloride: 101 mEq/L (ref 96–112)
Creatinine, Ser: 0.89 mg/dL (ref 0.50–1.35)
GFR calc Af Amer: >90 mL/min (ref >90)
GFR calc non Af Amer: >90 mL/min (ref >90)
GLUCOSE: 108 mg/dL — AB (ref 70–99)
Potassium: 4.5 mEq/L (ref 3.7–5.3)
SODIUM: 141 meq/L (ref 137–147)

## 2013-08-13 LAB — URINE MICROSCOPIC-ADD ON

## 2013-08-13 LAB — URINALYSIS, ROUTINE W REFLEX MICROSCOPIC
Bilirubin Urine: NEGATIVE
GLUCOSE, UA: NEGATIVE mg/dL
Hgb urine dipstick: NEGATIVE
Ketones, ur: NEGATIVE mg/dL
NITRITE: NEGATIVE
Protein, ur: NEGATIVE mg/dL
SPECIFIC GRAVITY, URINE: 1.028 (ref 1.005–1.030)
Urobilinogen, UA: 0.2 mg/dL (ref 0.0–1.0)
pH: 5.5 (ref 5.0–8.0)

## 2013-08-13 LAB — TYPE AND SCREEN
ABO/RH(D): B POS
Antibody Screen: NEGATIVE

## 2013-08-13 LAB — CBC
HEMATOCRIT: 37.2 % — AB (ref 39.0–52.0)
Hemoglobin: 11 g/dL — ABNORMAL LOW (ref 13.0–17.0)
MCH: 20.3 pg — ABNORMAL LOW (ref 26.0–34.0)
MCHC: 29.6 g/dL — ABNORMAL LOW (ref 30.0–36.0)
MCV: 68.8 fL — AB (ref 78.0–100.0)
PLATELETS: 276 10*3/uL (ref 150–400)
RBC: 5.41 MIL/uL (ref 4.22–5.81)
RDW: 22.7 % — ABNORMAL HIGH (ref 11.5–15.5)
WBC: 11.1 10*3/uL — AB (ref 4.0–10.5)

## 2013-08-13 LAB — APTT: aPTT: 26 seconds (ref 24–37)

## 2013-08-13 LAB — SURGICAL PCR SCREEN
MRSA, PCR: NEGATIVE
Staphylococcus aureus: NEGATIVE

## 2013-08-13 LAB — PROTIME-INR
INR: 0.97 (ref 0.00–1.49)
Prothrombin Time: 12.9 seconds (ref 11.6–15.2)

## 2013-08-13 NOTE — Progress Notes (Signed)
08/13/13 0826  OBSTRUCTIVE SLEEP APNEA  Have you ever been diagnosed with sleep apnea through a sleep study? No (had to use bipap after last surgery)  Do you snore loudly (loud enough to be heard through closed doors)?  1  Do you often feel tired, fatigued, or sleepy during the daytime? 0  Do you have, or are you being treated for high blood pressure? 1  BMI more than 35 kg/m2? 1  Age over 55 years old? 1  Neck circumference greater than 40 cm/16 inches? 1 (20)  Gender: 1  Obstructive Sleep Apnea Score 6  Score 4 or greater  Results sent to PCP

## 2013-08-14 ENCOUNTER — Encounter (HOSPITAL_COMMUNITY): Payer: Self-pay

## 2013-08-14 LAB — URINE CULTURE

## 2013-08-14 NOTE — Progress Notes (Signed)
Anesthesia chart review: Patient is a 53 year old male scheduled for left TKA on 08/21/13 by Dr. Marlou Sa.    History includes DM2, HTN, GERD, anxiety, osteoarthritis, psoriatic arthritis, claustrophobia, cholecystectomy, anemia, non-smoker, right TKA 10/01/12, L3-S1 fusion 03/11/13 with post-operative course complicated by acute kidney injury, anemia, and post-operative ileus with associated acute respiratory failure with hypoxia (felt related to mechanical obstruction from abdominal distention, pain medication, body habitus, possibly undiagnosed OSA). BMI is consistent with morbid obesity. OSA screening score was 6 (no formal testing yet; needed Bi-Pap post-operatively in the past). PCP is Dr. Velna Hatchet at [facility]). Rheumatologist is Dr. Estanislado Pandy. Prior to living in [place], he lived in [place].   Patient had a cardiology evaluation by Dr. Daneen Schick [facility]) on 09/25/12 for abnormal EKG. A nuclear stress test was ordered and done on 09/26/12. Results showed inferior defect consistent with diaphragmatic attenuation, small fixed mild anterior defect likely soft tissue attenuation, no ischemia, post stress EF 50%. Low risk scan. (Scanned under Media tab, Correspondence 10/01/12.)   EKG on 03/12/13 showed ST @ 105 bpm, inferior infarct (age undetermined). His EKG was not felt significantly changed from previous tracing in 09/2012.    2VCXR on 09/23/12 showed: Bibasilar scarring versus fibrosis. No superimposed acute process. He had a 1V CXR as part of a abdominal series on 03/14/13 that showed: Heart size upper normal. Mild bibasilar atelectasis. No free air. Study limited by body habitus. There are air-fluid levels throughout small and large bowel with mild small bowel distention to about 5 cm. Gas is seen as far as the transverse colon. There is no gas in the rectum. IMPRESSION: Similar to prior study. Ileus is again considered to be more likely. A very low grade distal small bowel obstruction is not  excluded.  Preoperative labs noted. BUN/Cr WNL.  H/H 11.0/37.2, up from 03/15/13. PT/PTT WNL. T&S done.     Further evaluation by his assigned anesthesiologist on the day of surgery. If no acute changes then I would anticipate that he could proceed as planned.  OSA screening score was elevated and he reportedly required Bi-PAP after his last surgery, so this should be taken into consideration when determining his post-operative level of care.    [name] [facility]/Anesthesiology Phone [phone] 08/14/2013

## 2013-08-20 MED ORDER — CHLORHEXIDINE GLUCONATE 4 % EX LIQD
60.0000 mL | Freq: Once | CUTANEOUS | Status: DC
  Filled 2013-08-20: qty 60

## 2013-08-20 MED ORDER — DEXTROSE 5 % IV SOLN
3.0000 g | INTRAVENOUS | Status: AC
  Administered 2013-08-21: 3 g via INTRAVENOUS
  Filled 2013-08-20: qty 3000

## 2013-08-21 ENCOUNTER — Inpatient Hospital Stay (HOSPITAL_COMMUNITY)
Admission: RE | Admit: 2013-08-21 | Discharge: 2013-08-23 | DRG: 470 | Disposition: A | Discharge status: Skilled Nursing Facility | Payer: Commercial Managed Care - HMO | Source: Ambulatory Visit | Attending: Orthopedic Surgery | Admitting: Orthopedic Surgery

## 2013-08-21 ENCOUNTER — Encounter (HOSPITAL_COMMUNITY): Payer: Commercial Managed Care - HMO | Admitting: Vascular Surgery

## 2013-08-21 ENCOUNTER — Inpatient Hospital Stay (HOSPITAL_COMMUNITY): Payer: Commercial Managed Care - HMO | Admitting: Certified Registered Nurse Anesthetist

## 2013-08-21 ENCOUNTER — Encounter (HOSPITAL_COMMUNITY): Payer: Self-pay | Admitting: *Deleted

## 2013-08-21 ENCOUNTER — Encounter (HOSPITAL_COMMUNITY)
Admission: RE | Disposition: A | Discharge status: Skilled Nursing Facility | Payer: Self-pay | Source: Ambulatory Visit | Attending: Orthopedic Surgery

## 2013-08-21 DIAGNOSIS — Z7901 Long term (current) use of anticoagulants: Secondary | ICD-10-CM

## 2013-08-21 DIAGNOSIS — F329 Major depressive disorder, single episode, unspecified: Secondary | ICD-10-CM | POA: Diagnosis present

## 2013-08-21 DIAGNOSIS — K219 Gastro-esophageal reflux disease without esophagitis: Secondary | ICD-10-CM | POA: Diagnosis present

## 2013-08-21 DIAGNOSIS — G4730 Sleep apnea, unspecified: Secondary | ICD-10-CM | POA: Diagnosis present

## 2013-08-21 DIAGNOSIS — M25569 Pain in unspecified knee: Secondary | ICD-10-CM | POA: Diagnosis present

## 2013-08-21 DIAGNOSIS — Z79899 Other long term (current) drug therapy: Secondary | ICD-10-CM

## 2013-08-21 DIAGNOSIS — Z6841 Body Mass Index (BMI) 40.0 and over, adult: Secondary | ICD-10-CM

## 2013-08-21 DIAGNOSIS — E119 Type 2 diabetes mellitus without complications: Secondary | ICD-10-CM | POA: Diagnosis present

## 2013-08-21 DIAGNOSIS — F411 Generalized anxiety disorder: Secondary | ICD-10-CM | POA: Diagnosis present

## 2013-08-21 DIAGNOSIS — Z96659 Presence of unspecified artificial knee joint: Secondary | ICD-10-CM | POA: Diagnosis not present

## 2013-08-21 DIAGNOSIS — I10 Essential (primary) hypertension: Secondary | ICD-10-CM | POA: Diagnosis present

## 2013-08-21 DIAGNOSIS — E785 Hyperlipidemia, unspecified: Secondary | ICD-10-CM | POA: Diagnosis present

## 2013-08-21 DIAGNOSIS — M1710 Unilateral primary osteoarthritis, unspecified knee: Principal | ICD-10-CM | POA: Diagnosis present

## 2013-08-21 DIAGNOSIS — L4050 Arthropathic psoriasis, unspecified: Secondary | ICD-10-CM | POA: Diagnosis present

## 2013-08-21 DIAGNOSIS — M109 Gout, unspecified: Secondary | ICD-10-CM | POA: Diagnosis present

## 2013-08-21 DIAGNOSIS — F3289 Other specified depressive episodes: Secondary | ICD-10-CM | POA: Diagnosis present

## 2013-08-21 HISTORY — PX: TOTAL KNEE ARTHROPLASTY: SHX125

## 2013-08-21 LAB — GLUCOSE, CAPILLARY
Glucose-Capillary: 121 mg/dL — ABNORMAL HIGH (ref 70–99)
Glucose-Capillary: 99 mg/dL (ref 70–99)
Glucose-Capillary: 121 mg/dL — ABNORMAL HIGH (ref 70–99)
Glucose-Capillary: 114 mg/dL — ABNORMAL HIGH (ref 70–99)
Glucose-Capillary: 156 mg/dL — ABNORMAL HIGH (ref 70–99)

## 2013-08-21 SURGERY — ARTHROPLASTY, KNEE, TOTAL
Anesthesia: General | Site: Knee | Laterality: Left

## 2013-08-21 MED ORDER — FENTANYL CITRATE 0.05 MG/ML IJ SOLN
INTRAMUSCULAR | Status: AC
  Filled 2013-08-21: qty 5

## 2013-08-21 MED ORDER — NEOSTIGMINE METHYLSULFATE 10 MG/10ML IV SOLN
INTRAVENOUS | Status: AC
  Filled 2013-08-21: qty 1

## 2013-08-21 MED ORDER — CEFAZOLIN SODIUM-DEXTROSE 2-3 GM-% IV SOLR
2.0000 g | Freq: Three times a day (TID) | INTRAVENOUS | Status: AC
Start: 2013-08-21 — End: 2013-08-22
  Administered 2013-08-21 – 2013-08-22 (×2): 2 g via INTRAVENOUS
  Filled 2013-08-21 (×2): qty 50

## 2013-08-21 MED ORDER — METOCLOPRAMIDE HCL 5 MG/ML IJ SOLN: 10.0000 mg | Freq: Once | INTRAMUSCULAR | Status: DC | PRN

## 2013-08-21 MED ORDER — HYDROMORPHONE HCL PF 1 MG/ML IJ SOLN
INTRAMUSCULAR | Status: AC
  Administered 2013-08-21: 0.5 mg via INTRAVENOUS
  Filled 2013-08-21: qty 1

## 2013-08-21 MED ORDER — SODIUM CHLORIDE 0.9 % IJ SOLN: INTRAMUSCULAR | Status: DC | PRN

## 2013-08-21 MED ORDER — HYDROXYZINE HCL 25 MG PO TABS
100.0000 mg | ORAL_TABLET | Freq: Three times a day (TID) | ORAL | Status: DC | PRN
  Administered 2013-08-21 – 2013-08-22 (×3): 100 mg via ORAL
  Filled 2013-08-21 (×3): qty 4

## 2013-08-21 MED ORDER — ACETAMINOPHEN 325 MG PO TABS: 650.0000 mg | ORAL_TABLET | Freq: Four times a day (QID) | ORAL | Status: DC | PRN

## 2013-08-21 MED ORDER — ONDANSETRON HCL 4 MG/2ML IJ SOLN: 4.0000 mg | Freq: Four times a day (QID) | INTRAMUSCULAR | Status: DC | PRN

## 2013-08-21 MED ORDER — BISACODYL 5 MG PO TBEC: 5.0000 mg | DELAYED_RELEASE_TABLET | Freq: Every day | ORAL | Status: DC | PRN

## 2013-08-21 MED ORDER — WARFARIN SODIUM 7.5 MG PO TABS
7.5000 mg | ORAL_TABLET | Freq: Once | ORAL | Status: AC
  Administered 2013-08-21: 7.5 mg via ORAL
  Filled 2013-08-21: qty 1

## 2013-08-21 MED ORDER — LIDOCAINE HCL (CARDIAC) 20 MG/ML IV SOLN
INTRAVENOUS | Status: AC
  Filled 2013-08-21: qty 10

## 2013-08-21 MED ORDER — LORATADINE 10 MG PO TABS
10.0000 mg | ORAL_TABLET | Freq: Every day | ORAL | Status: DC
Start: 2013-08-21 — End: 2013-08-23
  Administered 2013-08-21 – 2013-08-23 (×3): 10 mg via ORAL
  Filled 2013-08-21 (×3): qty 1

## 2013-08-21 MED ORDER — OXYCODONE HCL 5 MG PO TABS
15.0000 mg | ORAL_TABLET | ORAL | Status: DC | PRN
  Administered 2013-08-21 – 2013-08-23 (×6): 15 mg via ORAL
  Filled 2013-08-21 (×7): qty 3

## 2013-08-21 MED ORDER — NEOSTIGMINE METHYLSULFATE 10 MG/10ML IV SOLN
INTRAVENOUS | Status: DC | PRN
  Administered 2013-08-21: 3 mg via INTRAVENOUS

## 2013-08-21 MED ORDER — MORPHINE SULFATE 4 MG/ML IJ SOLN
INTRAMUSCULAR | Status: DC | PRN
  Administered 2013-08-21: 8 ug

## 2013-08-21 MED ORDER — MIDAZOLAM HCL 5 MG/ML IJ SOLN: 2.0000 mg | Freq: Once | INTRAMUSCULAR | Status: DC

## 2013-08-21 MED ORDER — GLYCOPYRROLATE 0.2 MG/ML IJ SOLN
INTRAMUSCULAR | Status: AC
  Filled 2013-08-21: qty 2

## 2013-08-21 MED ORDER — MIDAZOLAM HCL 2 MG/2ML IJ SOLN
INTRAMUSCULAR | Status: AC
Start: 2013-08-21 — End: 2013-08-21
  Administered 2013-08-21: 1 mg
  Filled 2013-08-21: qty 2

## 2013-08-21 MED ORDER — POTASSIUM CHLORIDE IN NACL 20-0.9 MEQ/L-% IV SOLN
INTRAVENOUS | Status: AC
  Administered 2013-08-22: 10:00:00 via INTRAVENOUS
  Filled 2013-08-21 (×2): qty 1000

## 2013-08-21 MED ORDER — HYDROCORTISONE 10 MG PO TABS
10.0000 mg | ORAL_TABLET | Freq: Every day | ORAL | Status: DC | PRN
  Filled 2013-08-21: qty 1

## 2013-08-21 MED ORDER — LACTATED RINGERS IV SOLN
INTRAVENOUS | Status: DC | PRN
  Administered 2013-08-21 (×2): via INTRAVENOUS

## 2013-08-21 MED ORDER — FENTANYL CITRATE 0.05 MG/ML IJ SOLN
INTRAMUSCULAR | Status: AC
Start: 2013-08-21 — End: 2013-08-21
  Filled 2013-08-21: qty 5

## 2013-08-21 MED ORDER — BUPIVACAINE-EPINEPHRINE (PF) 0.5% -1:200000 IJ SOLN
INTRAMUSCULAR | Status: DC | PRN
  Administered 2013-08-21: 30 mL via PERINEURAL

## 2013-08-21 MED ORDER — EPHEDRINE SULFATE 50 MG/ML IJ SOLN
INTRAMUSCULAR | Status: DC | PRN
  Administered 2013-08-21: 5 mg via INTRAVENOUS

## 2013-08-21 MED ORDER — PHENOL 1.4 % MT LIQD: 1.0000 | OROMUCOSAL | Status: DC | PRN

## 2013-08-21 MED ORDER — PROPOFOL 10 MG/ML IV BOLUS
INTRAVENOUS | Status: AC
  Filled 2013-08-21: qty 20

## 2013-08-21 MED ORDER — FENTANYL CITRATE 0.05 MG/ML IJ SOLN
INTRAMUSCULAR | Status: AC
  Filled 2013-08-21: qty 2

## 2013-08-21 MED ORDER — TRANEXAMIC ACID 100 MG/ML IV SOLN
1000.0000 mg | INTRAVENOUS | Status: AC
  Administered 2013-08-21: 1000 mg via INTRAVENOUS
  Filled 2013-08-21: qty 10

## 2013-08-21 MED ORDER — LACTATED RINGERS IV SOLN
INTRAVENOUS | Status: DC
  Administered 2013-08-21: 09:00:00 via INTRAVENOUS

## 2013-08-21 MED ORDER — ACETAMINOPHEN 650 MG RE SUPP: 650.0000 mg | Freq: Four times a day (QID) | RECTAL | Status: DC | PRN

## 2013-08-21 MED ORDER — MIDAZOLAM HCL 5 MG/ML IJ SOLN: 1.0000 mg | Freq: Once | INTRAMUSCULAR | Status: DC

## 2013-08-21 MED ORDER — OXYCODONE HCL 5 MG PO TABS: 5.0000 mg | ORAL_TABLET | Freq: Once | ORAL | Status: DC | PRN

## 2013-08-21 MED ORDER — AQUAPHOR EX OINT
1.0000 "application " | TOPICAL_OINTMENT | Freq: Every day | CUTANEOUS | Status: DC | PRN
  Filled 2013-08-21: qty 50

## 2013-08-21 MED ORDER — MENTHOL 3 MG MT LOZG: 1.0000 | LOZENGE | OROMUCOSAL | Status: DC | PRN

## 2013-08-21 MED ORDER — FENTANYL CITRATE 0.05 MG/ML IJ SOLN
INTRAMUSCULAR | Status: DC | PRN
  Administered 2013-08-21 (×6): 50 ug via INTRAVENOUS
  Administered 2013-08-21: 150 ug via INTRAVENOUS
  Administered 2013-08-21: 50 ug via INTRAVENOUS

## 2013-08-21 MED ORDER — SERTRALINE HCL 25 MG PO TABS
25.0000 mg | ORAL_TABLET | Freq: Every day | ORAL | Status: DC
  Administered 2013-08-21 – 2013-08-23 (×3): 25 mg via ORAL
  Filled 2013-08-21 (×3): qty 1

## 2013-08-21 MED ORDER — METOCLOPRAMIDE HCL 5 MG/ML IJ SOLN: 5.0000 mg | Freq: Three times a day (TID) | INTRAMUSCULAR | Status: DC | PRN

## 2013-08-21 MED ORDER — MIDAZOLAM HCL 2 MG/2ML IJ SOLN
INTRAMUSCULAR | Status: AC
  Administered 2013-08-21: 2 mg
  Filled 2013-08-21: qty 2

## 2013-08-21 MED ORDER — BISMUTH SUBSALICYLATE 262 MG/15ML PO SUSP
15.0000 mL | Freq: Four times a day (QID) | ORAL | Status: DC | PRN
  Administered 2013-08-22: 30 mL via ORAL
  Filled 2013-08-21 (×2): qty 236

## 2013-08-21 MED ORDER — OXYCODONE HCL 5 MG PO TABS
ORAL_TABLET | ORAL | Status: AC
  Filled 2013-08-21: qty 3

## 2013-08-21 MED ORDER — FENTANYL CITRATE 0.05 MG/ML IJ SOLN
100.0000 ug | Freq: Once | INTRAMUSCULAR | Status: AC
  Administered 2013-08-21: 100 ug via INTRAVENOUS

## 2013-08-21 MED ORDER — LOSARTAN POTASSIUM 50 MG PO TABS
50.0000 mg | ORAL_TABLET | Freq: Every day | ORAL | Status: DC
  Administered 2013-08-22 – 2013-08-23 (×2): 50 mg via ORAL
  Filled 2013-08-21 (×2): qty 1

## 2013-08-21 MED ORDER — 0.9 % SODIUM CHLORIDE (POUR BTL) OPTIME
TOPICAL | Status: DC | PRN
  Administered 2013-08-21 (×2): 1000 mL

## 2013-08-21 MED ORDER — METFORMIN HCL 500 MG PO TABS
1000.0000 mg | ORAL_TABLET | Freq: Two times a day (BID) | ORAL | Status: DC
  Administered 2013-08-21 – 2013-08-23 (×4): 1000 mg via ORAL
  Filled 2013-08-21 (×6): qty 2

## 2013-08-21 MED ORDER — CLONIDINE HCL (ANALGESIA) 100 MCG/ML EP SOLN
EPIDURAL | Status: DC | PRN
  Administered 2013-08-21: 1 mL via INTRA_ARTICULAR

## 2013-08-21 MED ORDER — OXYCODONE HCL 5 MG/5ML PO SOLN: 5.0000 mg | Freq: Once | ORAL | Status: DC | PRN

## 2013-08-21 MED ORDER — FENTANYL CITRATE 0.05 MG/ML IJ SOLN
50.0000 ug | Freq: Once | INTRAMUSCULAR | Status: AC
  Administered 2013-08-21: 50 ug via INTRAVENOUS

## 2013-08-21 MED ORDER — METHOCARBAMOL 500 MG PO TABS
500.0000 mg | ORAL_TABLET | Freq: Four times a day (QID) | ORAL | Status: DC | PRN
  Administered 2013-08-21 – 2013-08-23 (×6): 500 mg via ORAL
  Filled 2013-08-21 (×6): qty 1

## 2013-08-21 MED ORDER — CLONIDINE HCL (ANALGESIA) 100 MCG/ML EP SOLN
150.0000 ug | EPIDURAL | Status: DC
  Filled 2013-08-21: qty 1.5

## 2013-08-21 MED ORDER — ROCURONIUM BROMIDE 100 MG/10ML IV SOLN
INTRAVENOUS | Status: DC | PRN
Start: 2013-08-21 — End: 2013-08-21
  Administered 2013-08-21: 50 mg via INTRAVENOUS

## 2013-08-21 MED ORDER — SUCCINYLCHOLINE CHLORIDE 20 MG/ML IJ SOLN
INTRAMUSCULAR | Status: DC | PRN
  Administered 2013-08-21: 120 mg via INTRAVENOUS

## 2013-08-21 MED ORDER — PROPOFOL 10 MG/ML IV BOLUS
INTRAVENOUS | Status: DC | PRN
  Administered 2013-08-21: 200 mg via INTRAVENOUS
  Administered 2013-08-21: 50 mg via INTRAVENOUS
  Administered 2013-08-21: 100 mg via INTRAVENOUS

## 2013-08-21 MED ORDER — MORPHINE SULFATE 4 MG/ML IJ SOLN
INTRAMUSCULAR | Status: AC
  Filled 2013-08-21: qty 2

## 2013-08-21 MED ORDER — ONDANSETRON HCL 4 MG/2ML IJ SOLN
INTRAMUSCULAR | Status: DC | PRN
  Administered 2013-08-21: 4 mg via INTRAVENOUS

## 2013-08-21 MED ORDER — ALLOPURINOL 100 MG PO TABS
100.0000 mg | ORAL_TABLET | Freq: Two times a day (BID) | ORAL | Status: DC
  Administered 2013-08-21 – 2013-08-23 (×4): 100 mg via ORAL
  Filled 2013-08-21 (×5): qty 1

## 2013-08-21 MED ORDER — CLOBETASOL PROPIONATE 0.05 % EX OINT
1.0000 "application " | TOPICAL_OINTMENT | Freq: Every day | CUTANEOUS | Status: DC | PRN
  Administered 2013-08-22: 1 via TOPICAL
  Filled 2013-08-21 (×2): qty 15

## 2013-08-21 MED ORDER — BUPIVACAINE HCL (PF) 0.25 % IJ SOLN
INTRAMUSCULAR | Status: DC | PRN
  Administered 2013-08-21: 20 mL
  Administered 2013-08-21: 10 mL via INTRA_ARTICULAR

## 2013-08-21 MED ORDER — ARTIFICIAL TEARS OP OINT
TOPICAL_OINTMENT | OPHTHALMIC | Status: AC
  Filled 2013-08-21: qty 3.5

## 2013-08-21 MED ORDER — PHENYLEPHRINE HCL 10 MG/ML IJ SOLN
INTRAMUSCULAR | Status: DC | PRN
  Administered 2013-08-21 (×2): 80 ug via INTRAVENOUS

## 2013-08-21 MED ORDER — ATORVASTATIN CALCIUM 40 MG PO TABS
40.0000 mg | ORAL_TABLET | Freq: Every day | ORAL | Status: DC
  Administered 2013-08-21 – 2013-08-23 (×3): 40 mg via ORAL
  Filled 2013-08-21 (×3): qty 1

## 2013-08-21 MED ORDER — FERROUS SULFATE 325 (65 FE) MG PO TABS
325.0000 mg | ORAL_TABLET | Freq: Every day | ORAL | Status: DC
  Administered 2013-08-22 – 2013-08-23 (×2): 325 mg via ORAL
  Filled 2013-08-21 (×3): qty 1

## 2013-08-21 MED ORDER — MIDAZOLAM HCL 2 MG/2ML IJ SOLN
INTRAMUSCULAR | Status: AC
  Filled 2013-08-21: qty 2

## 2013-08-21 MED ORDER — OXYCODONE HCL 5 MG PO TABS
30.0000 mg | ORAL_TABLET | Freq: Every day | ORAL | Status: DC
  Administered 2013-08-21: 15 mg via ORAL
  Administered 2013-08-22 (×2): 30 mg via ORAL
  Administered 2013-08-22: 15 mg via ORAL
  Administered 2013-08-22 – 2013-08-23 (×5): 30 mg via ORAL
  Filled 2013-08-21 (×7): qty 6

## 2013-08-21 MED ORDER — OXYCODONE HCL 5 MG PO TABS: 30.0000 mg | ORAL_TABLET | Freq: Every day | ORAL | Status: DC

## 2013-08-21 MED ORDER — MORPHINE SULFATE ER 15 MG PO TBCR
30.0000 mg | EXTENDED_RELEASE_TABLET | Freq: Two times a day (BID) | ORAL | Status: DC
  Administered 2013-08-21 – 2013-08-23 (×4): 30 mg via ORAL
  Filled 2013-08-21 (×5): qty 2

## 2013-08-21 MED ORDER — HYDROMORPHONE HCL PF 1 MG/ML IJ SOLN
1.0000 mg | INTRAMUSCULAR | Status: DC | PRN
  Administered 2013-08-21 – 2013-08-22 (×3): 1 mg via INTRAVENOUS
  Filled 2013-08-21 (×3): qty 1

## 2013-08-21 MED ORDER — SODIUM CHLORIDE 0.9 % IV SOLN
INTRAVENOUS | Status: DC | PRN
  Administered 2013-08-21: 13:00:00

## 2013-08-21 MED ORDER — BUPIVACAINE LIPOSOME 1.3 % IJ SUSP
20.0000 mL | Freq: Once | INTRAMUSCULAR | Status: DC
  Filled 2013-08-21: qty 20

## 2013-08-21 MED ORDER — ONDANSETRON HCL 4 MG PO TABS: 4.0000 mg | ORAL_TABLET | Freq: Four times a day (QID) | ORAL | Status: DC | PRN

## 2013-08-21 MED ORDER — MIDAZOLAM HCL 5 MG/ML IJ SOLN
1.0000 mg | Freq: Once | INTRAMUSCULAR | Status: AC
  Administered 2013-08-21: 1 mg via INTRAVENOUS

## 2013-08-21 MED ORDER — PANTOPRAZOLE SODIUM 40 MG PO TBEC
40.0000 mg | DELAYED_RELEASE_TABLET | Freq: Every day | ORAL | Status: DC
  Administered 2013-08-21 – 2013-08-23 (×3): 40 mg via ORAL
  Filled 2013-08-21 (×3): qty 1

## 2013-08-21 MED ORDER — HYDROMORPHONE HCL PF 1 MG/ML IJ SOLN
0.2500 mg | INTRAMUSCULAR | Status: DC | PRN
  Administered 2013-08-21 (×2): 0.5 mg via INTRAVENOUS

## 2013-08-21 MED ORDER — METOCLOPRAMIDE HCL 10 MG PO TABS: 5.0000 mg | ORAL_TABLET | Freq: Three times a day (TID) | ORAL | Status: DC | PRN

## 2013-08-21 MED ORDER — WARFARIN - PHARMACIST DOSING INPATIENT
Freq: Every day | Status: DC
  Administered 2013-08-22: 18:00:00

## 2013-08-21 MED ORDER — METHOCARBAMOL 500 MG PO TABS
ORAL_TABLET | ORAL | Status: AC
  Filled 2013-08-21: qty 1

## 2013-08-21 MED ORDER — BUPIVACAINE HCL (PF) 0.25 % IJ SOLN
INTRAMUSCULAR | Status: AC
  Filled 2013-08-21: qty 30

## 2013-08-21 MED ORDER — LIDOCAINE HCL (CARDIAC) 20 MG/ML IV SOLN
INTRAVENOUS | Status: DC | PRN
  Administered 2013-08-21: 100 mg via INTRAVENOUS
  Administered 2013-08-21: 60 mg via INTRATRACHEAL

## 2013-08-21 MED ORDER — GLYCOPYRROLATE 0.2 MG/ML IJ SOLN
INTRAMUSCULAR | Status: DC | PRN
  Administered 2013-08-21: 0.4 mg via INTRAVENOUS

## 2013-08-21 MED ORDER — AMLODIPINE BESYLATE 10 MG PO TABS
10.0000 mg | ORAL_TABLET | Freq: Every day | ORAL | Status: DC
  Administered 2013-08-21 – 2013-08-23 (×3): 10 mg via ORAL
  Filled 2013-08-21 (×3): qty 1

## 2013-08-21 MED ORDER — ONDANSETRON HCL 4 MG/2ML IJ SOLN
INTRAMUSCULAR | Status: AC
  Filled 2013-08-21: qty 2

## 2013-08-21 SURGICAL SUPPLY — 71 items
BANDAGE ESMARK 6X9 LF (GAUZE/BANDAGES/DRESSINGS) ×1 IMPLANT
BLADE SAG 18X100X1.27 (BLADE) ×3 IMPLANT
BLADE SAW SGTL 13.0X1.19X90.0M (BLADE) ×3 IMPLANT
BNDG ELASTIC 6X10 VLCR STRL LF (GAUZE/BANDAGES/DRESSINGS) ×6 IMPLANT
BNDG ESMARK 6X9 LF (GAUZE/BANDAGES/DRESSINGS) ×3
BOWL SMART MIX CTS (DISPOSABLE) ×3 IMPLANT
CEMENT BONE SIMPLEX SPEEDSET (Cement) ×6 IMPLANT
COVER SURGICAL LIGHT HANDLE (MISCELLANEOUS) ×3 IMPLANT
CUFF TOURNIQUET SINGLE 34IN LL (TOURNIQUET CUFF) IMPLANT
CUFF TOURNIQUET SINGLE 44IN (TOURNIQUET CUFF) ×3 IMPLANT
DRAPE INCISE IOBAN 66X45 STRL (DRAPES) ×3 IMPLANT
DRAPE ORTHO SPLIT 77X108 STRL (DRAPES) ×6
DRAPE SURG ORHT 6 SPLT 77X108 (DRAPES) ×3 IMPLANT
DRAPE U-SHAPE 47X51 STRL (DRAPES) ×3 IMPLANT
DURAPREP 26ML APPLICATOR (WOUND CARE) ×3 IMPLANT
ELECT REM PT RETURN 9FT ADLT (ELECTROSURGICAL) ×3
ELECTRODE REM PT RTRN 9FT ADLT (ELECTROSURGICAL) ×1 IMPLANT
FACESHIELD WRAPAROUND (MASK) ×3 IMPLANT
GAUZE XEROFORM 5X9 LF (GAUZE/BANDAGES/DRESSINGS) ×3 IMPLANT
GLOVE BIOGEL PI IND STRL 7.5 (GLOVE) ×1 IMPLANT
GLOVE BIOGEL PI IND STRL 8 (GLOVE) ×1 IMPLANT
GLOVE BIOGEL PI INDICATOR 7.5 (GLOVE) ×2
GLOVE BIOGEL PI INDICATOR 8 (GLOVE) ×2
GLOVE ECLIPSE 7.0 STRL STRAW (GLOVE) ×6 IMPLANT
GLOVE SURG ORTHO 8.0 STRL STRW (GLOVE) ×3 IMPLANT
GOWN STRL REUS W/ TWL LRG LVL3 (GOWN DISPOSABLE) ×3 IMPLANT
GOWN STRL REUS W/ TWL XL LVL3 (GOWN DISPOSABLE) ×1 IMPLANT
GOWN STRL REUS W/TWL LRG LVL3 (GOWN DISPOSABLE) ×6
GOWN STRL REUS W/TWL XL LVL3 (GOWN DISPOSABLE) ×2
HANDPIECE INTERPULSE COAX TIP (DISPOSABLE) ×2
HOOD PEEL AWAY FACE SHEILD DIS (HOOD) ×9 IMPLANT
IMMOBILIZER KNEE 20 (SOFTGOODS) IMPLANT
IMMOBILIZER KNEE 22 UNIV (SOFTGOODS) IMPLANT
IMMOBILIZER KNEE 24 THIGH 36 (MISCELLANEOUS) IMPLANT
IMMOBILIZER KNEE 24 UNIV (MISCELLANEOUS)
KIT BASIN OR (CUSTOM PROCEDURE TRAY) ×3 IMPLANT
KIT ROOM TURNOVER OR (KITS) ×3 IMPLANT
KNEE LEVEL 1C ×3 IMPLANT
MANIFOLD NEPTUNE II (INSTRUMENTS) ×3 IMPLANT
NEEDLE 18GX1X1/2 (RX/OR ONLY) (NEEDLE) ×3 IMPLANT
NEEDLE 21X1 OR PACK (NEEDLE) IMPLANT
NEEDLE HYPO 22GX1.5 SAFETY (NEEDLE) ×3 IMPLANT
NEEDLE SPNL 18GX3.5 QUINCKE PK (NEEDLE) ×3 IMPLANT
NS IRRIG 1000ML POUR BTL (IV SOLUTION) ×6 IMPLANT
PACK TOTAL JOINT (CUSTOM PROCEDURE TRAY) ×3 IMPLANT
PAD ABD 8X10 STRL (GAUZE/BANDAGES/DRESSINGS) ×3 IMPLANT
PAD ARMBOARD 7.5X6 YLW CONV (MISCELLANEOUS) ×6 IMPLANT
PAD CAST 4YDX4 CTTN HI CHSV (CAST SUPPLIES) ×1 IMPLANT
PADDING CAST COTTON 4X4 STRL (CAST SUPPLIES) ×2
PADDING CAST COTTON 6X4 STRL (CAST SUPPLIES) ×3 IMPLANT
RUBBERBAND STERILE (MISCELLANEOUS) ×3 IMPLANT
SET HNDPC FAN SPRY TIP SCT (DISPOSABLE) ×1 IMPLANT
SPONGE GAUZE 4X4 12PLY (GAUZE/BANDAGES/DRESSINGS) ×3 IMPLANT
SPONGE LAP 18X18 X RAY DECT (DISPOSABLE) IMPLANT
STAPLER VISISTAT 35W (STAPLE) ×3 IMPLANT
STEM CEMENTED (Stem) ×3 IMPLANT
SUCTION FRAZIER TIP 10 FR DISP (SUCTIONS) ×3 IMPLANT
SUT ETHILON 3 0 PS 1 (SUTURE) IMPLANT
SUT VIC AB 0 CTB1 27 (SUTURE) ×9 IMPLANT
SUT VIC AB 1 CT1 27 (SUTURE) ×10
SUT VIC AB 1 CT1 27XBRD ANBCTR (SUTURE) ×5 IMPLANT
SUT VIC AB 2-0 CT1 27 (SUTURE) ×4
SUT VIC AB 2-0 CT1 TAPERPNT 27 (SUTURE) ×2 IMPLANT
SYR 30ML LL (SYRINGE) ×3 IMPLANT
SYR 30ML SLIP (SYRINGE) ×3 IMPLANT
SYR 50ML LL SCALE MARK (SYRINGE) ×3 IMPLANT
SYR TB 1ML LUER SLIP (SYRINGE) ×2 IMPLANT
TOWEL OR 17X24 6PK STRL BLUE (TOWEL DISPOSABLE) ×3 IMPLANT
TOWEL OR 17X26 10 PK STRL BLUE (TOWEL DISPOSABLE) ×6 IMPLANT
TRAY FOLEY CATH 16FRSI W/METER (SET/KITS/TRAYS/PACK) ×3 IMPLANT
WATER STERILE IRR 1000ML POUR (IV SOLUTION) ×6 IMPLANT

## 2013-08-21 NOTE — Anesthesia Preprocedure Evaluation (Signed)
Anesthesia Evaluation  Patient identified by MRN, date of birth, ID band Patient awake    Reviewed: Allergy & Precautions, H&P , NPO status , Patient's Chart, lab work & pertinent test results, reviewed documented beta blocker date and time   Airway Mallampati: II TM Distance: >3 FB Neck ROM: full    Dental   Pulmonary neg pulmonary ROS, shortness of breath and with exertion, sleep apnea ,  breath sounds clear to auscultation        Cardiovascular hypertension, On Medications Rhythm:regular     Neuro/Psych PSYCHIATRIC DISORDERS negative neurological ROS     GI/Hepatic Neg liver ROS, GERD-  Medicated and Controlled,  Endo/Other  diabetes, Oral Hypoglycemic AgentsMorbid obesity  Renal/GU ARFRenal disease  negative genitourinary   Musculoskeletal   Abdominal   Peds  Hematology  (+) anemia ,   Anesthesia Other Findings See surgeon's H&P   Reproductive/Obstetrics negative OB ROS                           Anesthesia Physical Anesthesia Plan  ASA: III  Anesthesia Plan: General   Post-op Pain Management:    Induction: Intravenous  Airway Management Planned: Oral ETT  Additional Equipment:   Intra-op Plan:   Post-operative Plan: Extubation in OR  Informed Consent: I have reviewed the patients History and Physical, chart, labs and discussed the procedure including the risks, benefits and alternatives for the proposed anesthesia with the patient or authorized representative who has indicated his/her understanding and acceptance.   Dental Advisory Given  Plan Discussed with: CRNA and Surgeon  Anesthesia Plan Comments:         Anesthesia Quick Evaluation

## 2013-08-21 NOTE — Progress Notes (Addendum)
Pt c/o pain in left knee and lower back Dr Girard Cooter in to see pt. New orders for  Fentanyl and Versed noted.

## 2013-08-21 NOTE — Progress Notes (Signed)
ANTICOAGULATION CONSULT NOTE - Initial Consult  Pharmacy Consult for coumadin Indication: VTE prophylaxis  No Known Allergies  Patient Measurements: Height: 5' 11" (180.3 cm) Weight: 333 lb (151.048 kg) IBW/kg (Calculated) : 75.3 Heparin Dosing Weight:   Vital Signs: Temp: 97.5 F (36.4 C) (07/09 1630) Temp src: Oral (07/09 0917) BP: 144/84 mmHg (07/09 1630) Pulse Rate: 97 (07/09 1630)  Labs: No results found for this basename: HGB, HCT, PLT, APTT, LABPROT, INR, HEPARINUNFRC, CREATININE, CKTOTAL, CKMB, TROPONINI,  in the last 72 hours  Estimated Creatinine Clearance: 140.1 ml/min (by C-G formula based on Cr of 0.89).   Medical History: Past Medical History  Diagnosis Date  . Hypertension   . Anxiety   . Shortness of breath   . GERD (gastroesophageal reflux disease)   . Arthritis   . Psoriatic arthritis     takes Humira every 2 weeks  . Diabetes mellitus without complication     NIDDM x 2 years  . Degenerative arthritis     knees and spine  . Psoriasis   . Morbid obesity   . Claustrophobia   . Sleep apnea     possible; not tested: in hospital after last surgery had to use bipap  . Anemia     Medications:  Scheduled:  . allopurinol  100 mg Oral BID  . amLODipine  10 mg Oral Daily  . atorvastatin  40 mg Oral Daily  . bupivacaine liposome  20 mL Infiltration Once  .  ceFAZolin (ANCEF) IV  2 g Intravenous 3 times per day  . fentaNYL      . fentaNYL      . [START ON 08/22/2013] ferrous sulfate  325 mg Oral Q breakfast  . loratadine  10 mg Oral Daily  . [START ON 08/22/2013] losartan  50 mg Oral Daily  . metFORMIN  1,000 mg Oral BID WC  . methocarbamol      . morphine  30 mg Oral Q12H  . oxyCODONE      . oxycodone  30 mg Oral 5 X Daily  . pantoprazole  40 mg Oral Daily  . sertraline  25 mg Oral Daily   Infusions:  . 0.9 % NaCl with KCl 20 mEq / L      Assessment: 53 yo male s/p ortho surgery will be put on coumadin for VTE prophylaxis.  INR on 07/01 was  0.97.  Patient was not on any anticoagulants prior to admission.  Coumadin score = 7 Goal of Therapy:  INR 2-3 Monitor platelets by anticoagulation protocol: Yes   Plan:  1) Coumadin 7.41m po x1 2) Daily PT/INR  Tal Kempker, Tsz-Yin 08/21/2013,5:06 PM
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
## 2013-08-21 NOTE — Anesthesia Postprocedure Evaluation (Signed)
Anesthesia Post Note  Patient: Phillip Rocha  Procedure(s) Performed: Procedure(s) (LRB): LEFT TOTAL KNEE ARTHROPLASTY (Left)  Anesthesia type: General  Patient location: PACU  Post pain: Pain level controlled  Post assessment: Patient's Cardiovascular Status Stable  Last Vitals:  Filed Vitals:   08/21/13 1630  BP: 144/84  Pulse: 97  Temp: 36.4 C  Resp: 17    Post vital signs: Reviewed and stable  Level of consciousness: alert  Complications: No apparent anesthesia complications

## 2013-08-21 NOTE — Transfer of Care (Signed)
Immediate Anesthesia Transfer of Care Note  Patient: Phillip Rocha  Procedure(s) Performed: Procedure(s): LEFT TOTAL KNEE ARTHROPLASTY (Left)  Patient Location: PACU  Anesthesia Type:GA combined with regional for post-op pain  Level of Consciousness: awake  Airway & Oxygen Therapy: Patient Spontanous Breathing and Patient connected to nasal cannula oxygen  Post-op Assessment: Report given to PACU RN, Post -op Vital signs reviewed and stable and Patient moving all extremities  Post vital signs: Reviewed and stable  Complications: No apparent anesthesia complications

## 2013-08-21 NOTE — Progress Notes (Signed)
Orthopedic Tech Progress Note Patient Details:  Phillip Rocha December 19, 1960 [id number]  CPM Left Knee CPM Left Knee: On Left Knee Flexion (Degrees): 40 Left Knee Extension (Degrees): 0 Additional Comments: post op left TKA CPM applied ROM 0-40. zero knee left for patient use   Ashok Cordia 08/21/2013, 4:24 PM

## 2013-08-21 NOTE — Anesthesia Procedure Notes (Addendum)
Anesthesia Regional Block:  Adductor canal block  Pre-Anesthetic Checklist: ,, timeout performed, Correct Patient, Correct Site, Correct Laterality, Correct Procedure, Correct Position, site marked, Risks and benefits discussed,  Surgical consent,  Pre-op evaluation,  At surgeon's request and post-op pain management  Laterality: Left  Prep: chloraprep       Needles:  Injection technique: Single-shot  Needle Type: Stimulator Needle - 80     Needle Length: 10cm 10 cm Needle Gauge: 21 and 21 G    Additional Needles:  Procedures: ultrasound guided (picture in chart) Adductor canal block Narrative:  Start time: 08/21/2013 11:44 AM End time: 08/21/2013 11:54 AM Injection made incrementally with aspirations every 5 mL.  Performed by: Personally  Anesthesiologist: Earnest Bailey, MD   Procedure Name: Intubation Date/Time: 08/21/2013 12:16 PM Performed by: [place] Pink Pre-anesthesia Checklist: Patient identified, Emergency Drugs available, Suction available, Patient being monitored and Timeout performed Patient Re-evaluated:Patient Re-evaluated prior to inductionOxygen Delivery Method: Circle system utilized Preoxygenation: Pre-oxygenation with 100% oxygen Intubation Type: IV induction Ventilation: Oral airway inserted - appropriate to patient size and Mask ventilation without difficulty Laryngoscope Size: Mac and 4 Grade View: Grade I Tube type: Oral Tube size: 7.5 mm Number of attempts: 1 (DLx 2 by EMT Student Phillip Heal) Airway Equipment and Method: Stylet and LTA kit utilized Placement Confirmation: ETT inserted through vocal cords under direct vision,  positive ETCO2 and breath sounds checked- equal and bilateral Secured at: 23 cm Tube secured with: Tape Dental Injury: Teeth and Oropharynx as per pre-operative assessment

## 2013-08-21 NOTE — Brief Op Note (Signed)
08/21/2013  3:44 PM  PATIENT:  Darrol Poke  53 y.o. male  PRE-OPERATIVE DIAGNOSIS:  LEFT KNEE OSTEOARTHRITIS  POST-OPERATIVE DIAGNOSIS:  LEFT KNEE OSTEOARTHRITIS  PROCEDURE:  Procedure(s): LEFT TOTAL KNEE ARTHROPLASTY  SURGEON:  Surgeon(s): Meredith Pel, MD  ASSISTANT: carla bethune rnfa  ANESTHESIA:   general  EBL: 100 ml    Total I/O In: 1000 [I.V.:1000] Out: 475 [Urine:400; Blood:75]  BLOOD ADMINISTERED: none  DRAINS: none   LOCAL MEDICATIONS USED:  exparel mso4 marcaine  SPECIMEN:  No Specimen  COUNTS:  YES  TOURNIQUET:   Total Tourniquet Time Documented: Thigh (Left) - 114 minutes Total: Thigh (Left) - 114 minutes   DICTATION: .Other Dictation: Dictation Number [id number]  PLAN OF CARE: Admit to inpatient   PATIENT DISPOSITION:  PACU - hemodynamically stable

## 2013-08-22 ENCOUNTER — Encounter (HOSPITAL_COMMUNITY): Payer: Self-pay | Admitting: Orthopedic Surgery

## 2013-08-22 LAB — CBC
HEMATOCRIT: 32.7 % — AB (ref 39.0–52.0)
HEMOGLOBIN: 9.7 g/dL — AB (ref 13.0–17.0)
MCH: 20.7 pg — AB (ref 26.0–34.0)
MCHC: 29.7 g/dL — AB (ref 30.0–36.0)
MCV: 69.9 fL — ABNORMAL LOW (ref 78.0–100.0)
Platelets: 256 10*3/uL (ref 150–400)
RBC: 4.68 MIL/uL (ref 4.22–5.81)
RDW: 23.1 % — AB (ref 11.5–15.5)
WBC: 12.6 10*3/uL — ABNORMAL HIGH (ref 4.0–10.5)

## 2013-08-22 LAB — BASIC METABOLIC PANEL
Anion gap: 16 — ABNORMAL HIGH (ref 5–15)
BUN: 10 mg/dL (ref 6–23)
CO2: 25 mEq/L (ref 19–32)
Calcium: 8.2 mg/dL — ABNORMAL LOW (ref 8.4–10.5)
Chloride: 97 mEq/L (ref 96–112)
Creatinine, Ser: 0.89 mg/dL (ref 0.50–1.35)
GLUCOSE: 128 mg/dL — AB (ref 70–99)
Potassium: 4.2 mEq/L (ref 3.7–5.3)
Sodium: 138 mEq/L (ref 137–147)

## 2013-08-22 LAB — GLUCOSE, CAPILLARY
GLUCOSE-CAPILLARY: 135 mg/dL — AB (ref 70–99)
GLUCOSE-CAPILLARY: 118 mg/dL — AB (ref 70–99)
Glucose-Capillary: 157 mg/dL — ABNORMAL HIGH (ref 70–99)
Glucose-Capillary: 127 mg/dL — ABNORMAL HIGH (ref 70–99)

## 2013-08-22 LAB — PROTIME-INR
INR: 0.96 (ref 0.00–1.49)
Prothrombin Time: 12.8 seconds (ref 11.6–15.2)

## 2013-08-22 MED ORDER — OXYCODONE HCL 30 MG PO TABS: 30.0000 mg | ORAL_TABLET | Freq: Every day | ORAL | Status: DC

## 2013-08-22 MED ORDER — MORPHINE SULFATE ER 30 MG PO TBCR: 30.0000 mg | EXTENDED_RELEASE_TABLET | Freq: Two times a day (BID) | ORAL | Status: DC

## 2013-08-22 MED ORDER — WARFARIN SODIUM 5 MG PO TABS: 5.0000 mg | ORAL_TABLET | Freq: Every day | ORAL | Status: DC

## 2013-08-22 MED ORDER — COUMADIN BOOK
Freq: Once | Status: AC
  Administered 2013-08-22: 15:00:00
  Filled 2013-08-22: qty 1

## 2013-08-22 MED ORDER — HYDROXYZINE HCL 50 MG PO TABS: 100.0000 mg | ORAL_TABLET | Freq: Three times a day (TID) | ORAL | Status: DC | PRN

## 2013-08-22 MED ORDER — OXYCODONE HCL 15 MG PO TABS: 15.0000 mg | ORAL_TABLET | ORAL | Status: DC | PRN

## 2013-08-22 MED ORDER — WARFARIN VIDEO
Freq: Once | Status: AC
  Administered 2013-08-22: 14:00:00

## 2013-08-22 MED ORDER — WARFARIN SODIUM 7.5 MG PO TABS
7.5000 mg | ORAL_TABLET | Freq: Once | ORAL | Status: AC
  Administered 2013-08-22: 7.5 mg via ORAL
  Filled 2013-08-22: qty 1

## 2013-08-22 NOTE — Progress Notes (Signed)
Patient has confirmed a SNF bed at [facility] for dc possibly tomorrow. CSW awaiting insurance authorization.  Jeanette Caprice, MSW, [place]

## 2013-08-22 NOTE — Discharge Summary (Signed)
Physician Discharge Summary  Patient ID: Phillip Rocha MRN: [id number] DOB/AGE: 09/25/1960 53 y.o.  Admit date: 08/21/2013 Discharge date: 08/23/2013  Admission Diagnoses:  Active Problems:   Arthritis of knee   Discharge Diagnoses:  Same  Surgeries: Procedure(s): LEFT TOTAL KNEE ARTHROPLASTY on 08/21/2013   Consultants:    Discharged Condition: Stable  Hospital Course: Phillip Rocha is an 53 y.o. male who was admitted 08/21/2013 with a chief complaint of knee pain, and found to have a diagnosis of knee arthritis.  They were brought to the operating room on 08/21/2013 and underwent the above named procedures.  He did well and was transferred to snf pod 2 in good condition for intensive PT. Restart humira in 2 weeks. Discharged with coumadin for dvt prophylaxis  Antibiotics given:  Anti-infectives   Start     Dose/Rate Route Frequency Ordered Stop   08/21/13 2030  ceFAZolin (ANCEF) IVPB 2 g/50 mL premix     2 g 100 mL/hr over 30 Minutes Intravenous 3 times per day 08/21/13 1701 08/22/13 0630   08/21/13 0600  ceFAZolin (ANCEF) 3 g in dextrose 5 % 50 mL IVPB     3 g 160 mL/hr over 30 Minutes Intravenous On call to O.R. 08/20/13 1500 08/21/13 1255    .  Recent vital signs:  Filed Vitals:   08/22/13 0514  BP: 151/76  Pulse: 109  Temp: 98.6 F (37 C)  Resp: 16    Recent laboratory studies:  Results for orders placed during the hospital encounter of 08/21/13  GLUCOSE, CAPILLARY      Result Value Ref Range   Glucose-Capillary 121 (*) 70 - 99 mg/dL  GLUCOSE, CAPILLARY      Result Value Ref Range   Glucose-Capillary 99  70 - 99 mg/dL  GLUCOSE, CAPILLARY      Result Value Ref Range   Glucose-Capillary 121 (*) 70 - 99 mg/dL   Comment 1 Documented in Chart     Comment 2 Notify RN    GLUCOSE, CAPILLARY      Result Value Ref Range   Glucose-Capillary 114 (*) 70 - 99 mg/dL   Comment 1 Notify RN     Comment 2 Documented in Chart    GLUCOSE, CAPILLARY      Result Value Ref  Range   Glucose-Capillary 156 (*) 70 - 99 mg/dL   Comment 1 Notify RN    GLUCOSE, CAPILLARY      Result Value Ref Range   Glucose-Capillary 135 (*) 70 - 99 mg/dL    Discharge Medications:     Medication List    STOP taking these medications       diclofenac 75 MG EC tablet  Commonly known as:  VOLTAREN      TAKE these medications       allopurinol 100 MG tablet  Commonly known as:  ZYLOPRIM  Take 100 mg by mouth 2 (two) times daily.     amLODipine 10 MG tablet  Commonly known as:  NORVASC  Take 10 mg by mouth daily.     atorvastatin 40 MG tablet  Commonly known as:  LIPITOR  Take 40 mg by mouth daily.     bisacodyl 5 MG EC tablet  Commonly known as:  DULCOLAX  Take 5-10 mg by mouth daily as needed for mild constipation or moderate constipation.     bismuth subsalicylate 409 WJ/19JY suspension  Commonly known as:  PEPTO BISMOL  Take 15-30 mLs by mouth every 6 (six) hours as needed  for indigestion (for stomach).     clobetasol ointment 0.05 %  Commonly known as:  TEMOVATE  Apply 1 application topically daily as needed (for psoriasis= apply to whole body= mix with aquaphor ointment).     ferrous sulfate 325 (65 FE) MG tablet  Take 325 mg by mouth daily with breakfast.     fexofenadine-pseudoephedrine 180-240 MG per 24 hr tablet  Commonly known as:  ALLEGRA-D 24  Take 1 tablet by mouth daily.     HUMIRA PEN 40 MG/0.8ML Pnkt  Generic drug:  Adalimumab  Inject 40 mg into the skin every 14 (fourteen) days.     hydrocortisone 10 MG tablet  Commonly known as:  CORTEF  Take 10 mg by mouth 5 (five) times daily. Take with oxycodone to help with itching     hydrOXYzine 50 MG tablet  Commonly known as:  ATARAX/VISTARIL  Take 2 tablets (100 mg total) by mouth 3 (three) times daily as needed (itching).     losartan 50 MG tablet  Commonly known as:  COZAAR  Take 50 mg by mouth daily.     metFORMIN 1000 MG tablet  Commonly known as:  GLUCOPHAGE  Take 1,000 mg by  mouth 2 (two) times daily with a meal.     methocarbamol 500 MG tablet  Commonly known as:  ROBAXIN  Take 1 tablet (500 mg total) by mouth every 6 (six) hours as needed for muscle spasms.     mineral oil-hydrophilic petrolatum ointment  Apply 1 application topically daily as needed for dry skin (for psoriasis= apply to whole body= mix with clobetasol 0.05% ointment).     morphine 30 MG 12 hr tablet  Commonly known as:  MS CONTIN  Take 1 tablet (30 mg total) by mouth every 12 (twelve) hours.     NASAL ALLERGY NA  Place 1-2 sprays into both nostrils 3 (three) times daily as needed (allergies).     omeprazole 40 MG capsule  Commonly known as:  PRILOSEC  Take 40 mg by mouth daily.     oxyCODONE 15 MG immediate release tablet  Commonly known as:  ROXICODONE  Take 1 tablet (15 mg total) by mouth every 4 (four) hours as needed for severe pain.     oxycodone 30 MG immediate release tablet  Commonly known as:  ROXICODONE  Take 1 tablet (30 mg total) by mouth 5 (five) times daily.     sertraline 25 MG tablet  Commonly known as:  ZOLOFT  Take 25 mg by mouth daily.     warfarin 5 MG tablet  Commonly known as:  COUMADIN  Take 1 tablet (5 mg total) by mouth daily.        Diagnostic Studies: No results found.  Disposition: 03-Skilled Nursing Facility      Discharge Instructions   Call MD / Call 911    Complete by:  As directed   If you experience chest pain or shortness of breath, CALL 911 and be transported to the hospital emergency room.  If you develope a fever above 101 F, pus (white drainage) or increased drainage or redness at the wound, or calf pain, call your surgeon's office.     Constipation Prevention    Complete by:  As directed   Drink plenty of fluids.  Prune juice may be helpful.  You may use a stool softener, such as Colace (over the counter) 100 mg twice a day.  Use MiraLax (over the counter) for constipation as needed.  Diet - low sodium heart healthy     Complete by:  As directed      Discharge instructions    Complete by:  As directed   OK to restart HUMIRA in 2 weeks CPM 6 hours per day Work on knee extension Keep incision dry     Increase activity slowly as tolerated    Complete by:  As directed      Weight bearing as tolerated    Complete by:  As directed               Signed: DEAN,GREGORY SCOTT 08/22/2013, 7:18 AM

## 2013-08-22 NOTE — Progress Notes (Addendum)
Clinical Social Work Department CLINICAL SOCIAL WORK PLACEMENT NOTE 08/22/2013  Patient:  Phillip Rocha, Phillip Rocha  Account Number:  [id number] Admit date:  08/21/2013  Clinical Social Worker:  Megan Salon  Date/time:  08/22/2013 08:26 AM  Clinical Social Work is seeking post-discharge placement for this patient at the following level of care:   SKILLED NURSING   (*CSW will update this form in Epic as items are completed)   08/22/2013  Patient/family provided with [place] Department of Clinical Social Work's list of facilities offering this level of care within the geographic area requested by the patient (or if unable, by the patient's family).  08/22/2013  Patient/family informed of their freedom to choose among providers that offer the needed level of care, that participate in Medicare, Medicaid or managed care program needed by the patient, have an available bed and are willing to accept the patient.  08/22/2013  Patient/family informed of [facility]' ownership interest in [facility], as well as of the fact that they are under no obligation to receive care at this facility.  PASARR submitted to EDS on  PASARR number received on   FL2 transmitted to all facilities in geographic area requested by pt/family on  08/22/2013 FL2 transmitted to all facilities within larger geographic area on   Patient informed that his/her managed care company has contracts with or will negotiate with  certain facilities, including the following:     Patient/family informed of bed offers received:   Patient chooses bed at [facility]  Physician recommends and patient chooses bed at    Patient to be transferred to [facility] on 08/23/13- [facility] Rich, [place]   Patient to be transferred to facility by [place], [place]  Patient and family notified of transfer on 08/23/13- Blima Rich, [place]  Name of family member notified: CSW left message with Theodis Aguas  listed on patient's contacts.     The following physician request were entered in Epic:   Additional Comments: Patient has existing [place], MSW, [place]

## 2013-08-22 NOTE — Progress Notes (Signed)
Occupational Therapy Evaluation Patient Details Name: Phillip Rocha MRN: [id number] DOB: [birth year] Today's Date: 08/22/2013    History of Present Illness pt presents with L TKA and hx of R TKA and back surgery.     Clinical Impression   PTA pt lived at home and reports that he was independent with use of RW for ADLs and functional mobility. Pt currently limited by pain and required max encouragement to participate in OT this date. Pt moving well once up, however required mod (A) to power up from low recliner with +2 for safety and to manage LLE during transition. Pt would benefit from skilled OT to increase independence as well as SNF for ST Rehab prior to return home.     Follow Up Recommendations  SNF;Supervision/Assistance - 24 hour    Equipment Recommendations  Other (comment) (Bariatric BSC (300lbs+))       Precautions / Restrictions Precautions Precautions: Fall Required Braces or Orthoses: Knee Immobilizer - Left Knee Immobilizer - Left: On when out of bed or walking;Discontinue once straight leg raise with < 10 degree lag Restrictions Weight Bearing Restrictions: Yes LLE Weight Bearing: Weight bearing as tolerated      Mobility Bed Mobility Overal bed mobility: Needs Assistance Bed Mobility: Supine to Sit     Supine to sit: HOB elevated;Mod assist     General bed mobility comments: Not assessed; pt sitting in recliner before/after OT session. Pt reports recliner is significantly more comfortable.  Transfers Overall transfer level: Needs assistance Equipment used: Rolling walker (2 wheeled) Transfers: Sit to/from Stand Sit to Stand: Mod assist;+2 safety/equipment (from low surface (recliner))         General transfer comment: VC's for sequencing and mod (A) to power up from low recliner. Prior to return to chair, OT padded chair with pillow to raise height. Pt requires assist to position LLE during transition.          ADL Overall ADL's : Needs  assistance/impaired Eating/Feeding: Independent;Sitting   Grooming: Set up;Sitting   Upper Body Bathing: Sitting;Set up   Lower Body Bathing: Maximal assistance;Sit to/from stand   Upper Body Dressing : Minimal assistance;Sitting   Lower Body Dressing: Total assistance;Sit to/from stand;+2 for safety/equipment   Toilet Transfer: +2 for safety/equipment;RW;Minimal assistance;Ambulation (bariatric BSC)   Toileting- Clothing Manipulation and Hygiene: Minimal assistance;Sit to/from stand   Tub/ Shower Transfer: Minimal assistance;Ambulation;Walk-in shower;Rolling walker   Functional mobility during ADLs: Minimal assistance;Rolling walker General ADL Comments: Pt sitting in recliner when OT arrived and reports that the recliner feels much better. Attempted to see pt x3 today and required max encouragement to participate, however once engaged he did well. Pt required mod (A) +2 for safety for sit<>stand from low recliner and 2nd person to manage LLE. Pt is limited in LB ADLs due to pain and body habitus. Pt ambulated to bathroom and back to recliner.     Vision  Pt reports no change from baseline.  No apparent visual deficits.                  Perception Perception Perception Tested?: No   Praxis Praxis Praxis tested?: Within functional limits    Pertinent Vitals/Pain Pt reports pain in L knee, however does not rate pain.      Hand Dominance Right   Extremity/Trunk Assessment Upper Extremity Assessment Upper Extremity Assessment: Overall WFL for tasks assessed   Lower Extremity Assessment Lower Extremity Assessment: Defer to PT evaluation LLE Deficits / Details: AAROM ~10 - 45 LLE  Coordination: decreased fine motor;decreased gross motor   Cervical / Trunk Assessment Cervical / Trunk Assessment: Kyphotic   Communication Communication Communication: No difficulties   Cognition Arousal/Alertness: Awake/alert Behavior During Therapy: WFL for tasks  assessed/performed Overall Cognitive Status: Within Functional Limits for tasks assessed                                Home Living Family/patient expects to be discharged to:: Skilled nursing facility                                        Prior Functioning/Environment Level of Independence: Independent with assistive device(s)        Comments: used walker    OT Diagnosis: Generalized weakness;Acute pain   OT Problem List: Decreased strength;Decreased range of motion;Decreased activity tolerance;Impaired balance (sitting and/or standing);Decreased safety awareness;Decreased knowledge of use of DME or AE;Decreased knowledge of precautions;Pain   OT Treatment/Interventions: Self-care/ADL training;Therapeutic exercise;Energy conservation;DME and/or AE instruction;Therapeutic activities;Patient/family education;Balance training    OT Goals(Current goals can be found in the care plan section) Acute Rehab OT Goals Patient Stated Goal: to be independent and go to rehab OT Goal Formulation: With patient Time For Goal Achievement: 08/29/13 Potential to Achieve Goals: Good ADL Goals Pt Will Perform Grooming: with supervision;standing Pt Will Transfer to Toilet: with supervision;ambulating;bedside commode Pt Will Perform Toileting - Clothing Manipulation and hygiene: with supervision;sit to/from stand  OT Frequency: Min 2X/week              End of Session Equipment Utilized During Treatment: Gait belt;Rolling walker;Left knee immobilizer CPM Left Knee CPM Left Knee: Off Nurse Communication: Mobility status;Other (comment) (need for bariatric BSC)  Activity Tolerance: Patient limited by pain Patient left: in chair;with call bell/phone within reach   Time: 6834-1962 OT Time Calculation (min): 25 min Charges:  OT General Charges $OT Visit: 1 Procedure OT Evaluation $Initial OT Evaluation Tier I: 1 Procedure OT Treatments $Self Care/Home Management  : 23-37 mins  Juluis Rainier [phone] 08/22/2013, 4:32 PM

## 2013-08-22 NOTE — Op Note (Signed)
[name], [name] NO.:  [id number]  MEDICAL RECORD NO.:  [id number]  LOCATION:  5N03C                        FACILITY:  [facility]  PHYSICIAN:  Anderson Malta, M.D.    DATE OF BIRTH:  07/07/1960  DATE OF PROCEDURE: DATE OF DISCHARGE:                              OPERATIVE REPORT   PREOPERATIVE DIAGNOSIS:  Left knee arthritis.  POSTOPERATIVE DIAGNOSIS:  Left knee arthritis.  PROCEDURE:  Left total knee replacement posterior cruciate retaining Stryker 6 femur, 6 tibia, 11 deep dish poly insert, 38 mm 3 PEG patella.  SURGEON:  Anderson Malta, M.D.  ASSISTANT:  Laure Kidney, RNFA.  INDICATIONS:  Phillip Rocha is a patient with end-stage left knee arthritis who presents for operative management after explanation of risks and benefits.  PROCEDURE IN DETAIL:  The patient was brought to operating room, where general endotracheal anesthesia was induced.  Preop antibiotics administered.  Time-out was called.  Left leg was prescrubbed with alcohol, Betadine, allowed to air dry.  Prepped with DuraPrep solution and draped in a sterile manner.  Leg was covered with Ioban.  After sterile prepping and draping, leg was elevated and exsanguinated with Esmarch wrap.  Tourniquet was inflated after the time-out was called. Anterior approach to knee was made.  Skin and subcutaneous tissue sharply divided.  Median parapatellar approach was made and marked with #1 Vicryl suture.  Soft tissue removed from the anterior distal femur and lateral patellofemoral ligament released.  Fat pad partially excised.  Intramedullary alignment was then used to cut the tibia, taking initially 2 mm off the most affected medial side.  This was re- cut for 2 more millimeters off the most affected medial side, perpendicular to the mechanical axis.  Distal femoral cut was then made at 8 mm, and this allowed the 9 mm poly spacer equivalent to achieve full extension.  Chamfer cuts were then made and the PCL  retained.  The tibia was then keel punched to accept the 100 mm stem, it was cut in 3 degrees of posterior slope.  The patella then cut from 26 to15, 3 PEG patella placed with the trial components in position, with that the patient had excellent flexion, extension, and good patellar tracking, and good stability varus, valgus stress.  It was improved with an 11 mm poly spacer, however.  Trial components removed.  Thorough irrigation performed.  True components cemented into position with same stability with parameters maintained.  Exparel was placed into the capsule prior to cementation.  Excess cement was removed.  Tourniquet was released. Bleeding points controlled using electrocautery.  Thorough irrigation with over 6 L of irrigating solution was performed.  The incision was then closed over bolster using #1 Vicryl suture, interrupted inverted 0 Vicryl suture, and skin staples. The skin edges were anesthetized with 0.25% plain Marcaine 20 mL. Solution of Marcaine, morphine, and clonidine then injected into the knee.  The patient tolerated the procedure well without immediate complication.  Bulky dressing and knee immobilizer placed.  The patient was transferred to recovery in stable condition.     Anderson Malta, M.D.     GSD/MEDQ  D:  08/21/2013  T:  08/22/2013  Job:  [id number]

## 2013-08-22 NOTE — Progress Notes (Signed)
ANTICOAGULATION CONSULT NOTE - Follow-up  Pharmacy Consult for coumadin Indication: VTE prophylaxis  No Known Allergies  Patient Measurements: Height: 5' 11" (180.3 cm) Weight: 333 lb (151.048 kg) IBW/kg (Calculated) : 75.3  Vital Signs: Temp: 98.6 F (37 C) (07/10 0514) BP: 151/76 mmHg (07/10 0514) Pulse Rate: 109 (07/10 0514)  Labs:  Recent Labs  08/22/13 0650  HGB 9.7*  HCT 32.7*  PLT 256  LABPROT 12.8  INR 0.96  CREATININE 0.89    Estimated Creatinine Clearance: 140.1 ml/min (by C-G formula based on Cr of 0.89).  Assessment: 53 yo male s/p ortho surgery continues on coumadin for VTE prophylaxis. INR remains low as expected after only 1 dose. H/H is down but no bleeding noted.    Goal of Therapy:  INR 2-3   Plan:  1. Repeat coumadin 7.71m PO x 1 tonight 2. F/u AM INR  RSalome Arnt PharmD, BCPS Pager # [phone]/11/2013 2:01 PM

## 2013-08-22 NOTE — Care Management Note (Signed)
CARE MANAGEMENT NOTE 08/22/2013  Patient:  KAMAU, WEATHERALL   Account Number:  [id number]  Date Initiated:  08/22/2013  Documentation initiated by:  Ricki Miller  Subjective/Objective Assessment:   53 yr old male s/p left total knee arthroplasty.     Action/Plan:   Patient is going to SNF for shortterm rehab, wants to go to [facility]. Social worker notified.   Anticipated DC Date:  08/22/2013   Anticipated DC Plan:  SKILLED NURSING FACILITY  In-house referral  Clinical Social Worker      DC Planning Services  CM consult      Choice offered to / List presented to:     DME arranged  NA        [place] arranged  NA      Status of service:  Completed, signed off Medicare Important Message given?   (If response is "NO", the following Medicare IM given date fields will be blank) Date Medicare IM given:   Medicare IM given by:   Date Additional Medicare IM given:   Additional Medicare IM given by:    Discharge Disposition:  [place]  Per UR Regulation:  Reviewed for med. necessity/level of care/duration of stay

## 2013-08-22 NOTE — Progress Notes (Signed)
Subjective: Pt stable - pain ok   Objective: Vital signs in last 24 hours: Temp:  [97.5 F (36.4 C)-98.6 F (37 C)] 98.6 F (37 C) (07/10 0514) Pulse Rate:  [77-109] 109 (07/10 0514) Resp:  [0-24] 16 (07/10 0514) BP: (122-157)/(63-97) 151/76 mmHg (07/10 0514) SpO2:  [93 %-100 %] 100 % (07/10 0514) Weight:  [151.048 kg (333 lb)] 151.048 kg (333 lb) (07/09 0917)  Intake/Output from previous day: 07/09 0701 - 07/10 0700 In: 1980 [P.O.:480; I.V.:1500] Out: 1950 [Urine:1875; Blood:75] Intake/Output this shift:    Exam:  Neurovascular intact Sensation intact distally Intact pulses distally  Labs: No results found for this basename: HGB,  in the last 72 hours No results found for this basename: WBC, RBC, HCT, PLT,  in the last 72 hours No results found for this basename: NA, K, CL, CO2, BUN, CREATININE, GLUCOSE, CALCIUM,  in the last 72 hours No results found for this basename: LABPT, INR,  in the last 72 hours  Assessment/Plan: Plan dc to [facility] - rx on chart - dc summ done   Rylei Codispoti SCOTT 08/22/2013, 7:08 AM

## 2013-08-22 NOTE — Progress Notes (Signed)
Utilization review completed.

## 2013-08-22 NOTE — Progress Notes (Signed)
Clinical Social Work Department BRIEF PSYCHOSOCIAL ASSESSMENT 08/22/2013  Patient:  Phillip Rocha, Phillip Rocha     Account Number:  [id number]     Admit date:  08/21/2013  Clinical Social Worker:  Megan Salon  Date/Time:  08/22/2013 09:15 AM  Referred by:  Physician  Date Referred:  08/22/2013 Referred for  SNF Placement   Other Referral:   Interview type:  Patient Other interview type:    PSYCHOSOCIAL DATA Living Status:  ALONE Admitted from facility:   Level of care:   Primary support name:  Phillip Rocha Primary support relationship to patient:  FRIEND Degree of support available:   Okay    CURRENT CONCERNS Current Concerns  Post-Acute Placement   Other Concerns:    SOCIAL WORK ASSESSMENT / PLAN Clinical Social Worker received referral for SNF placement at d/c. Clinical Social Worker met with patient at bedside to offer support and discuss patient needs at discharge.  CSW introduced self and explained reason for visit. Patient reported he is agreeable for SNF placement and has been to [facility] before and liked their PT so he would like to go back. CSW will complete FL2 for MD's signature and will update patient when bed offer and insurance auth are received.    CSW remains available for support and to facilitate patient discharge needs once medically ready.   Assessment/plan status:  Psychosocial Support/Ongoing Assessment of Needs Other assessment/ plan:   Information/referral to community resources:   CSW information    PATIENT'S/FAMILY'S RESPONSE TO PLAN OF CARE: Patient states he would like [employer] at discharge.        Phillip Rocha, MSW, [place]

## 2013-08-22 NOTE — Discharge Instructions (Signed)
Information on my medicine - Coumadin   (Warfarin)  This medication education was reviewed with me or my healthcare representative as part of my discharge preparation.    Why was Coumadin prescribed for you? Coumadin was prescribed for you because you have a blood clot or a medical condition that can cause an increased risk of forming blood clots. Blood clots can cause serious health problems by blocking the flow of blood to the heart, lung, or brain. Coumadin can prevent harmful blood clots from forming. As a reminder your indication for Coumadin is:   Blood Clot Prevention After Orthopedic Surgery  What test will check on my response to Coumadin? While on Coumadin (warfarin) you will need to have an INR test regularly to ensure that your dose is keeping you in the desired range. The INR (international normalized ratio) number is calculated from the result of the laboratory test called prothrombin time (PT).  If an INR APPOINTMENT HAS NOT ALREADY BEEN MADE FOR YOU please schedule an appointment to have this lab work done by your health care provider within 7 days. Your INR goal is usually a number between:  2 to 3 or your provider may give you a more narrow range like 2-2.5.  Ask your health care provider during an office visit what your goal INR is.  What  do you need to  know  About  COUMADIN? Take Coumadin (warfarin) exactly as prescribed by your healthcare provider about the same time each day.  DO NOT stop taking without talking to the doctor who prescribed the medication.  Stopping without other blood clot prevention medication to take the place of Coumadin may increase your risk of developing a new clot or stroke.  Get refills before you run out.  What do you do if you miss a dose? If you miss a dose, take it as soon as you remember on the same day then continue your regularly scheduled regimen the next day.  Do not take two doses of Coumadin at the same time.  Important Safety  Information A possible side effect of Coumadin (Warfarin) is an increased risk of bleeding. You should call your healthcare provider right away if you experience any of the following:   Bleeding from an injury or your nose that does not stop.   Unusual colored urine (red or dark brown) or unusual colored stools (red or black).   Unusual bruising for unknown reasons.   A serious fall or if you hit your head (even if there is no bleeding).  Some foods or medicines interact with Coumadin (warfarin) and might alter your response to warfarin. To help avoid this:   Eat a balanced diet, maintaining a consistent amount of Vitamin K.   Notify your provider about major diet changes you plan to make.   Avoid alcohol or limit your intake to 1 drink for women and 2 drinks for men per day. (1 drink is 5 oz. wine, 12 oz. beer, or 1.5 oz. liquor.)  Make sure that ANY health care provider who prescribes medication for you knows that you are taking Coumadin (warfarin).  Also make sure the healthcare provider who is monitoring your Coumadin knows when you have started a new medication including herbals and non-prescription products.  Coumadin (Warfarin)  Major Drug Interactions  Increased Warfarin Effect Decreased Warfarin Effect  Alcohol (large quantities) Antibiotics (esp. Septra/Bactrim, Flagyl, Cipro) Amiodarone (Cordarone) Aspirin (ASA) Cimetidine (Tagamet) Megestrol (Megace) NSAIDs (ibuprofen, naproxen, etc.) Piroxicam (Feldene) Propafenone (Rythmol SR) Propranolol (  Inderal) Isoniazid (INH) Posaconazole (Noxafil) Barbiturates (Phenobarbital) Carbamazepine (Tegretol) Chlordiazepoxide (Librium) Cholestyramine (Questran) Griseofulvin Oral Contraceptives Rifampin Sucralfate (Carafate) Vitamin K   Coumadin (Warfarin) Major Herbal Interactions  Increased Warfarin Effect Decreased Warfarin Effect  Garlic Ginseng Ginkgo biloba Coenzyme Q10 Green tea St. Johns wort    Coumadin (Warfarin)  FOOD Interactions  Eat a consistent number of servings per week of foods HIGH in Vitamin K (1 serving =  cup)  Collards (cooked, or boiled & drained) Kale (cooked, or boiled & drained) Mustard greens (cooked, or boiled & drained) Parsley *serving size only =  cup Spinach (cooked, or boiled & drained) Swiss chard (cooked, or boiled & drained) Turnip greens (cooked, or boiled & drained)  Eat a consistent number of servings per week of foods MEDIUM-HIGH in Vitamin K (1 serving = 1 cup)  Asparagus (cooked, or boiled & drained) Broccoli (cooked, boiled & drained, or raw & chopped) Brussel sprouts (cooked, or boiled & drained) *serving size only =  cup Lettuce, raw (green leaf, endive, romaine) Spinach, raw Turnip greens, raw & chopped   These websites have more information on Coumadin (warfarin):  FailFactory.se; VeganReport.com.au;

## 2013-08-22 NOTE — Evaluation (Signed)
Physical Therapy Evaluation Patient Details Name: Phillip Rocha MRN: [id number] DOB: 09-Jun-1960 Today's Date: 08/22/2013   History of Present Illness  pt presents with L TKA and hx of R TKA and back surgery.    Clinical Impression  Pt very motivated, though requires 2nd person present for safety with mobility.  Pt indicates plan for D/C to SNF for further rehab prior to returning home.  Will continue to follow.      Follow Up Recommendations SNF    Equipment Recommendations  None recommended by PT    Recommendations for Other Services       Precautions / Restrictions Precautions Precautions: Fall Required Braces or Orthoses: Knee Immobilizer - Left Knee Immobilizer - Left: On when out of bed or walking;Discontinue once straight leg raise with < 10 degree lag Restrictions Weight Bearing Restrictions: Yes LLE Weight Bearing: Weight bearing as tolerated      Mobility  Bed Mobility Overal bed mobility: Needs Assistance Bed Mobility: Supine to Sit     Supine to sit: HOB elevated;Mod assist     General bed mobility comments: pt with heavy reliance on bed rails and uses momentum to A with bringing trunk up to sitting.  A with L LE throughout and used bed pad to A with bringing hips to EOB.    Transfers Overall transfer level: Needs assistance Equipment used: Rolling walker (2 wheeled) Transfers: Sit to/from Stand Sit to Stand: Min assist;+2 physical assistance         General transfer comment: cues for UE use and positioning LEs.    Ambulation/Gait Ambulation/Gait assistance: Min assist Ambulation Distance (Feet): 40 Feet Assistive device: Rolling walker (2 wheeled) Gait Pattern/deviations: Step-to pattern;Decreased step length - right;Decreased stance time - left;Trunk flexed     General Gait Details: pt leans heavily on RW and required 1 standing rest break.    Stairs            Wheelchair Mobility    Modified Rankin (Stroke Patients Only)        Balance                                             Pertinent Vitals/Pain 6/10 during mobility.  Premedicated.      Home Living Family/patient expects to be discharged to:: Skilled nursing facility                      Prior Function Level of Independence: Independent with assistive device(s)         Comments: used walker     Hand Dominance   Dominant Hand: Right    Extremity/Trunk Assessment   Upper Extremity Assessment: Defer to OT evaluation           Lower Extremity Assessment: Generalized weakness;LLE deficits/detail   LLE Deficits / Details: AAROM ~10 - 45  Cervical / Trunk Assessment: Kyphotic  Communication   Communication: No difficulties  Cognition Arousal/Alertness: Awake/alert Behavior During Therapy: WFL for tasks assessed/performed Overall Cognitive Status: Within Functional Limits for tasks assessed                      General Comments      Exercises Total Joint Exercises Long Arc QuadSinclair Ship;Left;10 reps Knee Flexion: AAROM;Left;10 reps      Assessment/Plan    PT Assessment Patient needs continued PT services  PT Diagnosis Abnormality of gait;Acute pain   PT Problem List Decreased strength;Decreased range of motion;Decreased activity tolerance;Decreased balance;Decreased mobility;Decreased knowledge of use of DME;Pain  PT Treatment Interventions DME instruction;Gait training;Stair training;Functional mobility training;Therapeutic activities;Therapeutic exercise;Balance training;Patient/family education   PT Goals (Current goals can be found in the Care Plan section) Acute Rehab PT Goals Patient Stated Goal: Cooking PT Goal Formulation: With patient Time For Goal Achievement: 09/05/13 Potential to Achieve Goals: Good    Frequency 7X/week   Barriers to discharge        Co-evaluation               End of Session Equipment Utilized During Treatment: Gait belt;Left knee  immobilizer Activity Tolerance: Patient tolerated treatment well Patient left: in chair;with call bell/phone within reach Nurse Communication: Mobility status         Time: 9024-0973 PT Time Calculation (min): 34 min   Charges:   PT Evaluation $Initial PT Evaluation Tier I: 1 Procedure PT Treatments $Gait Training: 8-22 mins $Therapeutic Exercise: 8-22 mins   PT G CodesCatarina Hartshorn, [place] 08/22/2013, 1:08 PM

## 2013-08-23 LAB — CBC
HCT: 32.2 % — ABNORMAL LOW (ref 39.0–52.0)
HEMOGLOBIN: 9.4 g/dL — AB (ref 13.0–17.0)
MCH: 21 pg — AB (ref 26.0–34.0)
MCHC: 29.2 g/dL — ABNORMAL LOW (ref 30.0–36.0)
MCV: 72 fL — ABNORMAL LOW (ref 78.0–100.0)
Platelets: 242 10*3/uL (ref 150–400)
RBC: 4.47 MIL/uL (ref 4.22–5.81)
RDW: 23.1 % — ABNORMAL HIGH (ref 11.5–15.5)
WBC: 17.5 10*3/uL — ABNORMAL HIGH (ref 4.0–10.5)

## 2013-08-23 LAB — PROTIME-INR
INR: 1.18 (ref 0.00–1.49)
Prothrombin Time: 15 seconds (ref 11.6–15.2)

## 2013-08-23 LAB — GLUCOSE, CAPILLARY: Glucose-Capillary: 127 mg/dL — ABNORMAL HIGH (ref 70–99)

## 2013-08-23 MED ORDER — WARFARIN SODIUM 7.5 MG PO TABS
7.5000 mg | ORAL_TABLET | Freq: Once | ORAL | Status: DC
  Filled 2013-08-23: qty 1

## 2013-08-23 NOTE — Progress Notes (Signed)
ANTICOAGULATION CONSULT NOTE - Follow-up  Pharmacy Consult for coumadin Indication: VTE prophylaxis  No Known Allergies  Patient Measurements: Height: 5' 11" (180.3 cm) Weight: 333 lb (151.048 kg) IBW/kg (Calculated) : 75.3  Vital Signs: Temp: 99.5 F (37.5 C) (07/11 0553) Temp src: Oral (07/11 0553) BP: 110/59 mmHg (07/11 0553) Pulse Rate: 105 (07/11 0553)  Labs:  Recent Labs  08/22/13 0650 08/23/13 0543  HGB 9.7* 9.4*  HCT 32.7* 32.2*  PLT 256 242  LABPROT 12.8 15.0  INR 0.96 1.18  CREATININE 0.89  --     Estimated Creatinine Clearance: 140.1 ml/min (by C-G formula based on Cr of 0.89).  Assessment: 53 yo male s/p ortho surgery continues on coumadin for VTE prophylaxis. INR remains low as expected after only 2 doses. CBC stable  Goal of Therapy:  INR 2-3   Plan:  1. Repeat coumadin 7.37m PO x 1 tonight 2. F/u AM INR  LExcell Seltzer PharmD Pager # [phone]/12/2013 7:26 AM

## 2013-08-23 NOTE — Progress Notes (Signed)
Patient is medically stable for D/C to [facility] today. Per Raquel admissions coordinator at [facility] patient is going to room 101 or 102. CSW sent Raquel D/C summary via carefinder. CSW prepared D/C packet and will arrange EMS for transportation at 12:30 in order for patient to eat lunch here. CSW left message with Theodis Aguas listed on patient's contacts. Nursing is aware of above. Please reconsult if future social work needs arise. CSW signing off.   Blima Rich, [place] Weekend CSW [phone]

## 2013-08-23 NOTE — Progress Notes (Signed)
Patient ID: Phillip Rocha, male   DOB: 1960/06/26, 53 y.o.   MRN: [id number] Status post total knee arthroplasty. Patient states he is comfortable and ready for discharge to skilled nursing. Paperwork F. L2 prescriptions on the chart. Discharge summary completed. Okay for discharge to skilled nursing today.

## 2013-08-23 NOTE — Discharge Summary (Signed)
Physician Discharge Summary  Patient ID: Phillip Rocha MRN: [id number] DOB/AGE: 1960/04/25 53 y.o.  Admit date: 08/21/2013 Discharge date: 08/23/2013  Admission Diagnoses: Osteoarthritis knee  Discharge Diagnoses:  Active Problems:   Arthritis of knee   Discharged Condition: stable  Hospital Course: Patient's hospital course was essentially remarkable. He underwent total knee arthroplasty. Patient progressed well postoperatively.  Consults: None  Significant Diagnostic Studies: labs: Routine labs  Treatments: surgery: See operative note  Discharge Exam: Blood pressure 110/59, pulse 105, temperature 99.5 F (37.5 C), temperature source Oral, resp. rate 20, height 5' 11" (1.803 m), weight 151.048 kg (333 lb), SpO2 91.00%. Incision/Wound: dressing clean dry and intact  Disposition: 03-Skilled Nursing Facility  Discharge Instructions   Call MD / Call 911    Complete by:  As directed   If you experience chest pain or shortness of breath, CALL 911 and be transported to the hospital emergency room.  If you develope a fever above 101 F, pus (white drainage) or increased drainage or redness at the wound, or calf pain, call your surgeon's office.     Call MD / Call 911    Complete by:  As directed   If you experience chest pain or shortness of breath, CALL 911 and be transported to the hospital emergency room.  If you develope a fever above 101 F, pus (white drainage) or increased drainage or redness at the wound, or calf pain, call your surgeon's office.     Constipation Prevention    Complete by:  As directed   Drink plenty of fluids.  Prune juice may be helpful.  You may use a stool softener, such as Colace (over the counter) 100 mg twice a day.  Use MiraLax (over the counter) for constipation as needed.     Constipation Prevention    Complete by:  As directed   Drink plenty of fluids.  Prune juice may be helpful.  You may use a stool softener, such as Colace (over the counter) 100  mg twice a day.  Use MiraLax (over the counter) for constipation as needed.     Diet - low sodium heart healthy    Complete by:  As directed      Diet - low sodium heart healthy    Complete by:  As directed      Discharge instructions    Complete by:  As directed   OK to restart HUMIRA in 2 weeks CPM 6 hours per day Work on knee extension Keep incision dry     Increase activity slowly as tolerated    Complete by:  As directed      Increase activity slowly as tolerated    Complete by:  As directed      Weight bearing as tolerated    Complete by:  As directed             Medication List    STOP taking these medications       diclofenac 75 MG EC tablet  Commonly known as:  VOLTAREN      TAKE these medications       allopurinol 100 MG tablet  Commonly known as:  ZYLOPRIM  Take 100 mg by mouth 2 (two) times daily.     amLODipine 10 MG tablet  Commonly known as:  NORVASC  Take 10 mg by mouth daily.     atorvastatin 40 MG tablet  Commonly known as:  LIPITOR  Take 40 mg by mouth daily.  bisacodyl 5 MG EC tablet  Commonly known as:  DULCOLAX  Take 5-10 mg by mouth daily as needed for mild constipation or moderate constipation.     bismuth subsalicylate 099 IP/38SN suspension  Commonly known as:  PEPTO BISMOL  Take 15-30 mLs by mouth every 6 (six) hours as needed for indigestion (for stomach).     clobetasol ointment 0.05 %  Commonly known as:  TEMOVATE  Apply 1 application topically daily as needed (for psoriasis= apply to whole body= mix with aquaphor ointment).     ferrous sulfate 325 (65 FE) MG tablet  Take 325 mg by mouth daily with breakfast.     fexofenadine-pseudoephedrine 180-240 MG per 24 hr tablet  Commonly known as:  ALLEGRA-D 24  Take 1 tablet by mouth daily.     HUMIRA PEN 40 MG/0.8ML Pnkt  Generic drug:  Adalimumab  Inject 40 mg into the skin every 14 (fourteen) days.     hydrocortisone 10 MG tablet  Commonly known as:  CORTEF  Take 10 mg  by mouth 5 (five) times daily. Take with oxycodone to help with itching     hydrOXYzine 50 MG tablet  Commonly known as:  ATARAX/VISTARIL  Take 2 tablets (100 mg total) by mouth 3 (three) times daily as needed (itching).     losartan 50 MG tablet  Commonly known as:  COZAAR  Take 50 mg by mouth daily.     metFORMIN 1000 MG tablet  Commonly known as:  GLUCOPHAGE  Take 1,000 mg by mouth 2 (two) times daily with a meal.     methocarbamol 500 MG tablet  Commonly known as:  ROBAXIN  Take 1 tablet (500 mg total) by mouth every 6 (six) hours as needed for muscle spasms.     mineral oil-hydrophilic petrolatum ointment  Apply 1 application topically daily as needed for dry skin (for psoriasis= apply to whole body= mix with clobetasol 0.05% ointment).     morphine 30 MG 12 hr tablet  Commonly known as:  MS CONTIN  Take 1 tablet (30 mg total) by mouth every 12 (twelve) hours.     NASAL ALLERGY NA  Place 1-2 sprays into both nostrils 3 (three) times daily as needed (allergies).     omeprazole 40 MG capsule  Commonly known as:  PRILOSEC  Take 40 mg by mouth daily.     oxyCODONE 15 MG immediate release tablet  Commonly known as:  ROXICODONE  Take 1 tablet (15 mg total) by mouth every 4 (four) hours as needed for severe pain.     oxycodone 30 MG immediate release tablet  Commonly known as:  ROXICODONE  Take 1 tablet (30 mg total) by mouth 5 (five) times daily.     sertraline 25 MG tablet  Commonly known as:  ZOLOFT  Take 25 mg by mouth daily.     warfarin 5 MG tablet  Commonly known as:  COUMADIN  Take 1 tablet (5 mg total) by mouth daily.           Follow-up Information   Follow up with Meredith Pel, MD In 2 weeks.   Specialty:  Orthopedic Surgery   Contact information:   [place] [address] [phone]       Signed: Newt Minion 08/23/2013, 7:52 AM

## 2013-08-23 NOTE — Progress Notes (Signed)
[employer] and gave report to nurse, Marzetta Board at this time.  Stacy verbalized understanding.

## 2013-09-23 ENCOUNTER — Emergency Department (HOSPITAL_COMMUNITY)
Admission: EM | Admit: 2013-09-23 | Discharge: 2013-09-25 | Disposition: A | Discharge status: Home / Self Care | Payer: Medicare HMO | Attending: Emergency Medicine | Admitting: Emergency Medicine

## 2013-09-23 ENCOUNTER — Emergency Department (HOSPITAL_COMMUNITY): Payer: Medicare HMO

## 2013-09-23 ENCOUNTER — Encounter (HOSPITAL_COMMUNITY): Payer: Self-pay | Admitting: Emergency Medicine

## 2013-09-23 DIAGNOSIS — F411 Generalized anxiety disorder: Secondary | ICD-10-CM | POA: Diagnosis not present

## 2013-09-23 DIAGNOSIS — Z9889 Other specified postprocedural states: Secondary | ICD-10-CM | POA: Diagnosis not present

## 2013-09-23 DIAGNOSIS — Z9089 Acquired absence of other organs: Secondary | ICD-10-CM | POA: Diagnosis not present

## 2013-09-23 DIAGNOSIS — K219 Gastro-esophageal reflux disease without esophagitis: Secondary | ICD-10-CM | POA: Diagnosis not present

## 2013-09-23 DIAGNOSIS — M1990 Unspecified osteoarthritis, unspecified site: Secondary | ICD-10-CM | POA: Diagnosis not present

## 2013-09-23 DIAGNOSIS — Z872 Personal history of diseases of the skin and subcutaneous tissue: Secondary | ICD-10-CM | POA: Diagnosis not present

## 2013-09-23 DIAGNOSIS — E701 Other hyperphenylalaninemias: Secondary | ICD-10-CM | POA: Diagnosis not present

## 2013-09-23 DIAGNOSIS — D649 Anemia, unspecified: Secondary | ICD-10-CM | POA: Diagnosis not present

## 2013-09-23 DIAGNOSIS — E119 Type 2 diabetes mellitus without complications: Secondary | ICD-10-CM | POA: Insufficient documentation

## 2013-09-23 DIAGNOSIS — I10 Essential (primary) hypertension: Secondary | ICD-10-CM | POA: Insufficient documentation

## 2013-09-23 DIAGNOSIS — R1013 Epigastric pain: Secondary | ICD-10-CM | POA: Diagnosis not present

## 2013-09-23 DIAGNOSIS — R079 Chest pain, unspecified: Secondary | ICD-10-CM | POA: Diagnosis present

## 2013-09-23 DIAGNOSIS — E700 Classical phenylketonuria: Secondary | ICD-10-CM | POA: Diagnosis not present

## 2013-09-23 DIAGNOSIS — Z7901 Long term (current) use of anticoagulants: Secondary | ICD-10-CM | POA: Diagnosis not present

## 2013-09-23 DIAGNOSIS — IMO0002 Reserved for concepts with insufficient information to code with codable children: Secondary | ICD-10-CM | POA: Diagnosis not present

## 2013-09-23 DIAGNOSIS — Z79899 Other long term (current) drug therapy: Secondary | ICD-10-CM | POA: Diagnosis not present

## 2013-09-23 LAB — BASIC METABOLIC PANEL
Anion gap: 14 (ref 5–15)
BUN: 14 mg/dL (ref 6–23)
CO2: 24 mEq/L (ref 19–32)
Calcium: 9.2 mg/dL (ref 8.4–10.5)
Chloride: 100 mEq/L (ref 96–112)
Creatinine, Ser: 0.88 mg/dL (ref 0.50–1.35)
GFR calc Af Amer: >90 mL/min (ref >90)
GFR calc non Af Amer: >90 mL/min (ref >90)
Glucose, Bld: 94 mg/dL (ref 70–99)
Potassium: 4.2 mEq/L (ref 3.7–5.3)
Sodium: 138 mEq/L (ref 137–147)

## 2013-09-23 LAB — HEPATIC FUNCTION PANEL
ALBUMIN: 3.8 g/dL (ref 3.5–5.2)
ALT: 16 U/L (ref 0–53)
AST: 16 U/L (ref 0–37)
Alkaline Phosphatase: 116 U/L (ref 39–117)
Bilirubin, Direct: <0.2 mg/dL (ref 0.0–0.3)
TOTAL PROTEIN: 7.6 g/dL (ref 6.0–8.3)
Total Bilirubin: 0.3 mg/dL (ref 0.3–1.2)

## 2013-09-23 LAB — CBC
HCT: 36.8 % — ABNORMAL LOW (ref 39.0–52.0)
Hemoglobin: 11.2 g/dL — ABNORMAL LOW (ref 13.0–17.0)
MCH: 21.7 pg — ABNORMAL LOW (ref 26.0–34.0)
MCHC: 30.4 g/dL (ref 30.0–36.0)
MCV: 71.3 fL — ABNORMAL LOW (ref 78.0–100.0)
Platelets: 305 10*3/uL (ref 150–400)
RBC: 5.16 MIL/uL (ref 4.22–5.81)
RDW: 19.8 % — ABNORMAL HIGH (ref 11.5–15.5)
WBC: 10.6 10*3/uL — ABNORMAL HIGH (ref 4.0–10.5)

## 2013-09-23 LAB — LIPASE, BLOOD: Lipase: 35 U/L (ref 11–59)

## 2013-09-23 LAB — PRO B NATRIURETIC PEPTIDE: Pro B Natriuretic peptide (BNP): 25.2 pg/mL (ref 0–125)

## 2013-09-23 LAB — I-STAT TROPONIN, ED: Troponin i, poc: 0 ng/mL (ref 0.00–0.08)

## 2013-09-23 MED ORDER — GI COCKTAIL ~~LOC~~
30.0000 mL | Freq: Once | ORAL | Status: AC
  Administered 2013-09-23: 30 mL via ORAL
  Filled 2013-09-23: qty 30

## 2013-09-23 MED ORDER — MORPHINE SULFATE 4 MG/ML IJ SOLN
4.0000 mg | Freq: Once | INTRAMUSCULAR | Status: AC
  Administered 2013-09-23: 4 mg via INTRAMUSCULAR
  Filled 2013-09-23: qty 1

## 2013-09-23 NOTE — ED Notes (Signed)
Pt states he usually have chronic pain from his knee and lower back that he takes MS contin 30 mg q 12 hr last taken today at 8:30 am and OxyCodone 30 mg q4hr last taken at 4pm today.
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
## 2013-09-23 NOTE — ED Notes (Signed)
Pt. reports intermittent mid chest pain with SOB and nausea onset 2 days ago .

## 2013-09-24 LAB — I-STAT TROPONIN, ED: Troponin i, poc: 0 ng/mL (ref 0.00–0.08)

## 2013-09-24 MED ORDER — FAMOTIDINE IN NACL 20-0.9 MG/50ML-% IV SOLN
20.0000 mg | Freq: Once | INTRAVENOUS | Status: DC
  Filled 2013-09-24: qty 50

## 2013-09-24 MED ORDER — SUCRALFATE 1 GM/10ML PO SUSP
1.0000 g | Freq: Once | ORAL | Status: DC
  Filled 2013-09-24: qty 10

## 2013-09-29 ENCOUNTER — Other Ambulatory Visit: Payer: Self-pay | Admitting: Gastroenterology

## 2013-09-29 NOTE — ED Provider Notes (Signed)
Medical screening examination/treatment/procedure(s) were performed by non-physician practitioner and as supervising physician I was immediately available for consultation/collaboration.   EKG Interpretation   Date/Time:  Tuesday September 23 2013 20:09:38 EDT Ventricular Rate:  98 PR Interval:  186 QRS Duration: 86 QT Interval:  372 QTC Calculation: 474 R Axis:   33 Text Interpretation:  Normal sinus rhythm Possible Left atrial enlargement  Inferior infarct , age undetermined Abnormal ECG Sinus tachycardia NO  LONGER PRESENT Confirmed by Claiborne Billings  MD, THOMAS ([id number]) on 09/24/2013 6:14:04  PM       Virgel Manifold, MD 09/29/13 1743

## 2013-10-01 ENCOUNTER — Encounter (HOSPITAL_COMMUNITY): Payer: Self-pay | Admitting: Pharmacy Technician

## 2013-10-03 ENCOUNTER — Encounter (HOSPITAL_COMMUNITY): Payer: Self-pay | Admitting: *Deleted

## 2013-10-17 ENCOUNTER — Encounter (HOSPITAL_COMMUNITY)
Admission: RE | Disposition: A | Discharge status: Home / Self Care | Payer: Self-pay | Source: Ambulatory Visit | Attending: Gastroenterology

## 2013-10-17 ENCOUNTER — Ambulatory Visit (HOSPITAL_COMMUNITY)
Admission: RE | Admit: 2013-10-17 | Discharge: 2013-10-17 | Disposition: A | Discharge status: Home / Self Care | Payer: Medicare HMO | Source: Ambulatory Visit | Attending: Gastroenterology | Admitting: Gastroenterology

## 2013-10-17 ENCOUNTER — Encounter (HOSPITAL_COMMUNITY): Payer: Medicare HMO | Admitting: Anesthesiology

## 2013-10-17 ENCOUNTER — Encounter (HOSPITAL_COMMUNITY): Payer: Self-pay | Admitting: Anesthesiology

## 2013-10-17 ENCOUNTER — Ambulatory Visit (HOSPITAL_COMMUNITY): Payer: Medicare HMO | Admitting: Anesthesiology

## 2013-10-17 DIAGNOSIS — E119 Type 2 diabetes mellitus without complications: Secondary | ICD-10-CM | POA: Insufficient documentation

## 2013-10-17 DIAGNOSIS — L4050 Arthropathic psoriasis, unspecified: Secondary | ICD-10-CM | POA: Insufficient documentation

## 2013-10-17 DIAGNOSIS — K2960 Other gastritis without bleeding: Secondary | ICD-10-CM | POA: Diagnosis not present

## 2013-10-17 DIAGNOSIS — Z6841 Body Mass Index (BMI) 40.0 and over, adult: Secondary | ICD-10-CM | POA: Insufficient documentation

## 2013-10-17 DIAGNOSIS — M479 Spondylosis, unspecified: Secondary | ICD-10-CM | POA: Diagnosis not present

## 2013-10-17 DIAGNOSIS — I10 Essential (primary) hypertension: Secondary | ICD-10-CM | POA: Diagnosis not present

## 2013-10-17 DIAGNOSIS — K5730 Diverticulosis of large intestine without perforation or abscess without bleeding: Secondary | ICD-10-CM | POA: Diagnosis not present

## 2013-10-17 DIAGNOSIS — K219 Gastro-esophageal reflux disease without esophagitis: Secondary | ICD-10-CM | POA: Insufficient documentation

## 2013-10-17 DIAGNOSIS — Z96659 Presence of unspecified artificial knee joint: Secondary | ICD-10-CM | POA: Insufficient documentation

## 2013-10-17 DIAGNOSIS — F411 Generalized anxiety disorder: Secondary | ICD-10-CM | POA: Insufficient documentation

## 2013-10-17 DIAGNOSIS — D126 Benign neoplasm of colon, unspecified: Secondary | ICD-10-CM | POA: Diagnosis not present

## 2013-10-17 DIAGNOSIS — D509 Iron deficiency anemia, unspecified: Secondary | ICD-10-CM | POA: Diagnosis present

## 2013-10-17 DIAGNOSIS — F40298 Other specified phobia: Secondary | ICD-10-CM | POA: Insufficient documentation

## 2013-10-17 HISTORY — PX: ESOPHAGOGASTRODUODENOSCOPY (EGD) WITH PROPOFOL: SHX5813

## 2013-10-17 HISTORY — PX: COLONOSCOPY WITH PROPOFOL: SHX5780

## 2013-10-17 LAB — GLUCOSE, CAPILLARY: GLUCOSE-CAPILLARY: 110 mg/dL — AB (ref 70–99)

## 2013-10-17 SURGERY — ESOPHAGOGASTRODUODENOSCOPY (EGD) WITH PROPOFOL
Anesthesia: Monitor Anesthesia Care

## 2013-10-17 MED ORDER — SODIUM CHLORIDE 0.9 % IV SOLN: INTRAVENOUS | Status: DC

## 2013-10-17 MED ORDER — PROPOFOL 10 MG/ML IV BOLUS
INTRAVENOUS | Status: AC
  Filled 2013-10-17: qty 20

## 2013-10-17 MED ORDER — LIDOCAINE HCL (CARDIAC) 20 MG/ML IV SOLN
INTRAVENOUS | Status: DC | PRN
  Administered 2013-10-17: 100 mg via INTRAVENOUS

## 2013-10-17 MED ORDER — PROPOFOL 10 MG/ML IV BOLUS
INTRAVENOUS | Status: DC | PRN
  Administered 2013-10-17: 40 mg via INTRAVENOUS
  Administered 2013-10-17: 50 mg via INTRAVENOUS
  Administered 2013-10-17: 40 mg via INTRAVENOUS

## 2013-10-17 MED ORDER — LACTATED RINGERS IV SOLN
INTRAVENOUS | Status: DC
  Administered 2013-10-17: 1000 mL via INTRAVENOUS

## 2013-10-17 MED ORDER — PROPOFOL INFUSION 10 MG/ML OPTIME
INTRAVENOUS | Status: DC | PRN
  Administered 2013-10-17: 100 ug/kg/min via INTRAVENOUS

## 2013-10-17 SURGICAL SUPPLY — 24 items

## 2013-10-17 NOTE — Discharge Instructions (Signed)
Esophagogastroduodenoscopy Care After Refer to this sheet in the next few weeks. These instructions provide you with information on caring for yourself after your procedure. Your caregiver may also give you more specific instructions. Your treatment has been planned according to current medical practices, but problems sometimes occur. Call your caregiver if you have any problems or questions after your procedure.  HOME CARE INSTRUCTIONS  Do not eat or drink anything until the numbing medicine (local anesthetic) has worn off and your gag reflex has returned. You will know that the local anesthetic has worn off when you can swallow comfortably.  Do not drive for 12 hours after the procedure or as directed by your caregiver.  Only take medicines as directed by your caregiver. SEEK MEDICAL CARE IF:   You cannot stop coughing.  You are not urinating at all or less than usual. SEEK IMMEDIATE MEDICAL CARE IF:  You have difficulty swallowing.  You cannot eat or drink.  You have worsening throat or chest pain.  You have dizziness, lightheadedness, or you faint.  You have nausea or vomiting.  You have chills.  You have a fever.  You have severe abdominal pain.  You have black, tarry, or bloody stools. Document Released: 01/17/2012 Document Reviewed: 01/17/2012 [facility] Patient Information 2015 [place]. This information is not intended to replace advice given to you by your health care provider. Make sure you discuss any questions you have with your health care provider.  Colonoscopy, Care After Refer to this sheet in the next few weeks. These instructions provide you with information on caring for yourself after your procedure. Your health care provider may also give you more specific instructions. Your treatment has been planned according to current medical practices, but problems sometimes occur. Call your health care provider if you have any problems or questions after your  procedure. WHAT TO EXPECT AFTER THE PROCEDURE  After your procedure, it is typical to have the following:  A small amount of blood in your stool.  Moderate amounts of gas and mild abdominal cramping or bloating. HOME CARE INSTRUCTIONS  Do not drive, operate machinery, or sign important documents for 24 hours.  You may shower and resume your regular physical activities, but move at a slower pace for the first 24 hours.  Take frequent rest periods for the first 24 hours.  Walk around or put a warm pack on your abdomen to help reduce abdominal cramping and bloating.  Drink enough fluids to keep your urine clear or pale yellow.  You may resume your normal diet as instructed by your health care provider. Avoid heavy or fried foods that are hard to digest.  Avoid drinking alcohol for 24 hours or as instructed by your health care provider.  Only take over-the-counter or prescription medicines as directed by your health care provider.  If a tissue sample (biopsy) was taken during your procedure:  Do not take aspirin or blood thinners for 7 days, or as instructed by your health care provider.  Do not drink alcohol for 7 days, or as instructed by your health care provider.  Eat soft foods for the first 24 hours. SEEK MEDICAL CARE IF: You have persistent spotting of blood in your stool 2-3 days after the procedure. SEEK IMMEDIATE MEDICAL CARE IF:  You have more than a small spotting of blood in your stool.  You pass large blood clots in your stool.  Your abdomen is swollen (distended).  You have nausea or vomiting.  You have a  fever.  You have increasing abdominal pain that is not relieved with medicine. Document Released: 09/14/2003 Document Revised: 11/20/2012 Document Reviewed: 10/07/2012 [facility] Patient Information 2015 [place], [place]. This information is not intended to replace advice given to you by your health care provider. Make sure you discuss any questions you have  with your health care provider.

## 2013-10-17 NOTE — Op Note (Signed)
[facility] [place], [address]   OPERATIVE PROCEDURE REPORT  PATIENT: Bion, Todorov  MR#: [id number] BIRTHDATE: 1960/12/10  GENDER: Male ENDOSCOPIST: Carol Ada, MD ASSISTANT:   Verlon Au, RN, BSN Corliss Parish, technician PROCEDURE DATE: 10/17/2013 PROCEDURE:   Colonoscopy, diagnostic ASA CLASS:   Class III INDICATIONS:Iron Deficiency Anemia MEDICATIONS: MAC sedation, administered by CRNA  DESCRIPTION OF PROCEDURE:   After the risks benefits and alternatives of the procedure were thoroughly explained, informed consent was obtained.  A digital rectal exam revealed no abnormalities of the rectum.    The Pentax Ped Colon [id number] endoscope was introduced through the anus  and advanced to the cecum, which was identified by both the appendix and ileocecal valve , No adverse events experienced.    The quality of the prep was good. .  The instrument was then slowly withdrawn as the colon was fully examined.     FINDINGS: In the ascending colon a 2-3 mm sessile polyp was identified, but it was not able to be removed.  The patient's ascending colon was hypercontractile and the polyp was not able to be relocated again.  Scattered ascending colon diverticula were identified.  No other abnormaliteis noted.          The scope was then withdrawn from the patient and the procedure terminated.  COMPLICATIONS: There were no complications.  IMPRESSION: 1) Colonic polyp. 2) Diverticula.  RECOMMENDATIONS: 1) Repeat the colonscopy in 3 years.  _______________________________ eSignedCarol Ada, MD 10/17/2013 1:59 PM

## 2013-10-17 NOTE — Transfer of Care (Signed)
Immediate Anesthesia Transfer of Care Note  Patient: Phillip Rocha  Procedure(s) Performed: Procedure(s) (LRB): ESOPHAGOGASTRODUODENOSCOPY (EGD) WITH PROPOFOL (N/A) COLONOSCOPY WITH PROPOFOL (N/A)  Patient Location: PACU  Anesthesia Type: MAC  Level of Consciousness: sedated, patient cooperative and responds to stimulation  Airway & Oxygen Therapy: Patient Spontanous Breathing and Patient connected to face mask oxgen  Post-op Assessment: Report given to PACU RN and Post -op Vital signs reviewed and stable  Post vital signs: Reviewed and stable  Complications: No apparent anesthesia complications
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
## 2013-10-17 NOTE — Anesthesia Preprocedure Evaluation (Signed)
Anesthesia Evaluation  Patient identified by MRN, date of birth, ID band Patient awake    Reviewed: Allergy & Precautions, H&P , NPO status , Patient's Chart, lab work & pertinent test results  Airway Mallampati: II TM Distance: >3 FB Neck ROM: Full    Dental no notable dental hx.    Pulmonary neg pulmonary ROS,  breath sounds clear to auscultation  Pulmonary exam normal       Cardiovascular hypertension, Pt. on medications Rhythm:Regular Rate:Normal     Neuro/Psych PSYCHIATRIC DISORDERS Anxiety Depression negative neurological ROS     GI/Hepatic Neg liver ROS, GERD-  Medicated,  Endo/Other  diabetes, Type 2, Oral Hypoglycemic AgentsMorbid obesity  Renal/GU Renal disease  negative genitourinary   Musculoskeletal  (+) Arthritis -,   Abdominal (+) + obese,   Peds negative pediatric ROS (+)  Hematology  (+) anemia ,   Anesthesia Other Findings   Reproductive/Obstetrics negative OB ROS                           Anesthesia Physical Anesthesia Plan  ASA: III  Anesthesia Plan: MAC   Post-op Pain Management:    Induction: Intravenous  Airway Management Planned:   Additional Equipment:   Intra-op Plan:   Post-operative Plan:   Informed Consent: I have reviewed the patients History and Physical, chart, labs and discussed the procedure including the risks, benefits and alternatives for the proposed anesthesia with the patient or authorized representative who has indicated his/her understanding and acceptance.   Dental advisory given  Plan Discussed with: CRNA  Anesthesia Plan Comments:         Anesthesia Quick Evaluation

## 2013-10-17 NOTE — Anesthesia Postprocedure Evaluation (Signed)
  Anesthesia Post-op Note  Patient: Phillip Rocha  Procedure(s) Performed: Procedure(s) (LRB): ESOPHAGOGASTRODUODENOSCOPY (EGD) WITH PROPOFOL (N/A) COLONOSCOPY WITH PROPOFOL (N/A)  Patient Location: PACU  Anesthesia Type: MAC  Level of Consciousness: awake and alert   Airway and Oxygen Therapy: Patient Spontanous Breathing  Post-op Pain: mild  Post-op Assessment: Post-op Vital signs reviewed, Patient's Cardiovascular Status Stable, Respiratory Function Stable, Patent Airway and No signs of Nausea or vomiting  Last Vitals:  Filed Vitals:   10/17/13 1400  BP:   Pulse: 77  Temp:   Resp: 20    Post-op Vital Signs: stable   Complications: No apparent anesthesia complications

## 2013-10-17 NOTE — Op Note (Signed)
[facility] [place], [address]   OPERATIVE PROCEDURE REPORT  PATIENT: Phillip Rocha, Phillip Rocha  MR#: [id number] BIRTHDATE: [birth year]  GENDER: Male ENDOSCOPIST: Carol Ada, MD ASSISTANT:   Verlon Au, RN, BSN and Corliss Parish, technician PROCEDURE DATE: 10/17/2013 PROCEDURE:   EGD w/ biopsy ASA CLASS:   Class III INDICATIONS:IDA MEDICATIONS: MAC sedation, administered by CRNA TOPICAL ANESTHETIC:  DESCRIPTION OF PROCEDURE:   After the risks benefits and alternatives of the procedure were thoroughly explained, informed consent was obtained.  The     endoscope was introduced through the mouth  and advanced to the second portion of the duodenum Without limitations.      The instrument was slowly withdrawn as the mucosa was fully examined.      FINDINGS: A mild to moderate gastritis was identified and biopsied from the antrum.  A single erosion was noted.  The esophagus and duodenum were normal, but cold biopsies were obtained from the small bowel to evaluate for Celiac disease.   Retroflexed views revealed no abnormalities.     The scope was then withdrawn from the patient and the procedure terminated.  COMPLICATIONS: There were no complications.  IMPRESSION: 1) Gastritis. 2) Gastric erosion.  RECOMMENDATIONS: 1) Await biopsy results. 2) Continue with Dexilant.  _______________________________ Lorrin MaisCarol Ada, MD 10/17/2013 2:02 PM

## 2013-10-20 ENCOUNTER — Encounter (HOSPITAL_COMMUNITY): Payer: Self-pay | Admitting: Gastroenterology

## 2013-10-29 ENCOUNTER — Ambulatory Visit (INDEPENDENT_AMBULATORY_CARE_PROVIDER_SITE_OTHER): Payer: Commercial Managed Care - HMO | Admitting: Podiatry

## 2013-10-29 ENCOUNTER — Encounter: Payer: Self-pay | Admitting: Podiatry

## 2013-10-29 VITALS — BP 154/79 | HR 92 | Resp 12

## 2013-10-29 DIAGNOSIS — B351 Tinea unguium: Secondary | ICD-10-CM

## 2013-10-29 DIAGNOSIS — M79676 Pain in unspecified toe(s): Secondary | ICD-10-CM

## 2013-10-29 DIAGNOSIS — M79609 Pain in unspecified limb: Secondary | ICD-10-CM

## 2013-10-30 NOTE — Progress Notes (Signed)
Patient ID: Phillip Rocha, male   DOB: 03-18-1960, 53 y.o.   MRN: [id number]  Subjective: This patient presents complaining of painful toenails  Objective: The toenails are hypertrophic, elongated, discolored, brittle 6-10  Assessment: Symptomatic onychomycoses 6-10  Plan: Nails x10 are debrided without a bleeding  Reappoint x3 months

## 2013-11-04 ENCOUNTER — Other Ambulatory Visit: Payer: Self-pay | Admitting: Specialist

## 2013-11-04 DIAGNOSIS — M545 Low back pain: Secondary | ICD-10-CM

## 2013-11-10 ENCOUNTER — Ambulatory Visit
Admission: RE | Admit: 2013-11-10 | Discharge: 2013-11-10 | Disposition: A | Discharge status: Home / Self Care | Payer: Commercial Managed Care - HMO | Source: Ambulatory Visit | Attending: Specialist | Admitting: Specialist

## 2013-11-10 DIAGNOSIS — M545 Low back pain: Secondary | ICD-10-CM

## 2013-12-25 ENCOUNTER — Other Ambulatory Visit: Payer: Self-pay | Admitting: Specialist

## 2013-12-25 DIAGNOSIS — M545 Low back pain, unspecified: Secondary | ICD-10-CM

## 2013-12-25 DIAGNOSIS — M7918 Myalgia, other site: Secondary | ICD-10-CM

## 2013-12-25 DIAGNOSIS — M79652 Pain in left thigh: Secondary | ICD-10-CM

## 2013-12-25 DIAGNOSIS — G8929 Other chronic pain: Secondary | ICD-10-CM

## 2014-01-01 ENCOUNTER — Ambulatory Visit
Admission: RE | Admit: 2014-01-01 | Discharge: 2014-01-01 | Disposition: A | Discharge status: Home / Self Care | Payer: Commercial Managed Care - HMO | Source: Ambulatory Visit | Attending: Specialist | Admitting: Specialist

## 2014-01-01 DIAGNOSIS — M79652 Pain in left thigh: Secondary | ICD-10-CM

## 2014-01-01 DIAGNOSIS — G8929 Other chronic pain: Secondary | ICD-10-CM

## 2014-01-01 DIAGNOSIS — M7918 Myalgia, other site: Secondary | ICD-10-CM

## 2014-01-01 DIAGNOSIS — M545 Low back pain, unspecified: Secondary | ICD-10-CM

## 2014-01-01 MED ORDER — MEPERIDINE HCL 100 MG/ML IJ SOLN
100.0000 mg | Freq: Once | INTRAMUSCULAR | Status: AC
  Administered 2014-01-01: 100 mg via INTRAMUSCULAR

## 2014-01-01 MED ORDER — IOHEXOL 180 MG/ML  SOLN
15.0000 mL | Freq: Once | INTRAMUSCULAR | Status: AC | PRN
  Administered 2014-01-01: 15 mL via INTRATHECAL

## 2014-01-01 MED ORDER — DIAZEPAM 5 MG PO TABS
10.0000 mg | ORAL_TABLET | Freq: Once | ORAL | Status: AC
  Administered 2014-01-01: 10 mg via ORAL

## 2014-01-01 MED ORDER — ONDANSETRON HCL 4 MG/2ML IJ SOLN
4.0000 mg | Freq: Once | INTRAMUSCULAR | Status: AC
  Administered 2014-01-01: 4 mg via INTRAMUSCULAR

## 2014-01-01 NOTE — Discharge Instructions (Signed)
Myelogram Discharge Instructions  1. Go home and rest quietly for the next 24 hours.  It is important to lie flat for the next 24 hours.  Get up only to go to the restroom.  You may lie in the bed or on a couch on your back, your stomach, your left side or your right side.  You may have one pillow under your head.  You may have pillows between your knees while you are on your side or under your knees while you are on your back.  2. DO NOT drive today.  Recline the seat as far back as it will go, while still wearing your seat belt, on the way home.  3. You may get up to go to the bathroom as needed.  You may sit up for 10 minutes to eat.  You may resume your normal diet and medications unless otherwise indicated.  Drink lots of extra fluids today and tomorrow.  4. The incidence of headache, nausea, or vomiting is about 5% (one in 20 patients).  If you develop a headache, lie flat and drink plenty of fluids until the headache goes away.  Caffeinated beverages may be helpful.  If you develop severe nausea and vomiting or a headache that does not go away with flat bed rest, call [phone].  5. You may resume normal activities after your 24 hours of bed rest is over; however, do not exert yourself strongly or do any heavy lifting tomorrow. If when you get up you have a headache when standing, go back to bed and force fluids for another 24 hours.  6. Call your physician for a follow-up appointment.  The results of your myelogram will be sent directly to your physician by the following day.  7. If you have any questions or if complications develop after you arrive home, please call [phone].  Discharge instructions have been explained to the patient.  The patient, or the person responsible for the patient, fully understands these instructions.      May resume Zoloft on Nov. 20, 2015, after 11:00 am.

## 2014-01-28 ENCOUNTER — Ambulatory Visit: Payer: Commercial Managed Care - HMO | Admitting: Podiatry

## 2014-01-28 ENCOUNTER — Other Ambulatory Visit: Payer: Self-pay | Admitting: Internal Medicine

## 2014-01-28 DIAGNOSIS — K432 Incisional hernia without obstruction or gangrene: Secondary | ICD-10-CM

## 2014-02-03 ENCOUNTER — Ambulatory Visit
Admission: RE | Admit: 2014-02-03 | Discharge: 2014-02-03 | Disposition: A | Discharge status: Home / Self Care | Payer: Commercial Managed Care - HMO | Source: Ambulatory Visit | Attending: Internal Medicine | Admitting: Internal Medicine

## 2014-02-03 DIAGNOSIS — K432 Incisional hernia without obstruction or gangrene: Secondary | ICD-10-CM

## 2014-02-18 ENCOUNTER — Ambulatory Visit (INDEPENDENT_AMBULATORY_CARE_PROVIDER_SITE_OTHER): Payer: Commercial Managed Care - HMO | Admitting: Podiatry

## 2014-02-18 ENCOUNTER — Encounter: Payer: Self-pay | Admitting: Podiatry

## 2014-02-18 VITALS — BP 147/86 | HR 69 | Resp 12

## 2014-02-18 DIAGNOSIS — M79676 Pain in unspecified toe(s): Secondary | ICD-10-CM

## 2014-02-18 DIAGNOSIS — B351 Tinea unguium: Secondary | ICD-10-CM

## 2014-02-18 NOTE — Progress Notes (Signed)
Patient ID: Phillip Rocha, male   DOB: [birth year], 54 y.o.   MRN: [id number]  Subjective: This patient presents again complaining of painful toenails when wearing shoes  Objective: Patient is able to transfer from wheelchair to treatment chair The toenails are extremely hypertrophic, elongated, brittle, discolored and tender to palpation  Assessment: Symptomatic onychomycoses 6-10  Plan: Debrided toenails 10 without a bleeding  Reappoint 3 months

## 2014-03-17 ENCOUNTER — Inpatient Hospital Stay (HOSPITAL_COMMUNITY)
Admission: RE | Admit: 2014-03-17 | Discharge: 2014-03-17 | Disposition: A | Discharge status: Home / Self Care | Payer: Commercial Managed Care - HMO | Source: Ambulatory Visit

## 2014-03-24 ENCOUNTER — Inpatient Hospital Stay (HOSPITAL_COMMUNITY)
Admission: RE | Admit: 2014-03-24 | Payer: Commercial Managed Care - HMO | Source: Ambulatory Visit | Admitting: Specialist

## 2014-03-24 ENCOUNTER — Encounter (HOSPITAL_COMMUNITY): Admission: RE | Payer: Self-pay | Source: Ambulatory Visit

## 2014-03-24 SURGERY — FUSION, SPINE, LUMBAR, MINIMALLY INVASIVE
Anesthesia: General

## 2014-04-13 ENCOUNTER — Other Ambulatory Visit (HOSPITAL_COMMUNITY): Payer: Self-pay | Admitting: Internal Medicine

## 2014-04-13 ENCOUNTER — Other Ambulatory Visit (HOSPITAL_COMMUNITY): Payer: Commercial Managed Care - HMO | Admitting: Cardiology

## 2014-04-13 ENCOUNTER — Ambulatory Visit (HOSPITAL_COMMUNITY): Payer: Commercial Managed Care - HMO | Attending: Cardiovascular Disease

## 2014-04-13 DIAGNOSIS — R0602 Shortness of breath: Secondary | ICD-10-CM

## 2014-04-13 MED ORDER — PERFLUTREN LIPID MICROSPHERE
1.0000 mL | Freq: Once | INTRAVENOUS | Status: AC
  Administered 2014-04-13: 1 mL via INTRAVENOUS

## 2014-04-13 NOTE — Progress Notes (Signed)
2D Echo with Definity completed. 04/13/2014

## 2014-04-28 ENCOUNTER — Other Ambulatory Visit (HOSPITAL_COMMUNITY): Payer: Self-pay | Admitting: Surgery

## 2014-04-28 DIAGNOSIS — M7989 Other specified soft tissue disorders: Secondary | ICD-10-CM

## 2014-04-28 DIAGNOSIS — M25569 Pain in unspecified knee: Secondary | ICD-10-CM

## 2014-04-29 ENCOUNTER — Ambulatory Visit (HOSPITAL_COMMUNITY)
Admission: RE | Admit: 2014-04-29 | Discharge: 2014-04-29 | Disposition: A | Discharge status: Home / Self Care | Payer: Commercial Managed Care - HMO | Source: Ambulatory Visit | Attending: Internal Medicine | Admitting: Internal Medicine

## 2014-04-29 DIAGNOSIS — M7989 Other specified soft tissue disorders: Secondary | ICD-10-CM | POA: Diagnosis not present

## 2014-04-29 DIAGNOSIS — M25569 Pain in unspecified knee: Secondary | ICD-10-CM | POA: Insufficient documentation

## 2014-04-29 NOTE — Progress Notes (Signed)
*  PRELIMINARY RESULTS* Vascular Ultrasound Lower extremity venous duplex has been completed.  Preliminary findings: Technically limited due to body habitus. No obvious DVT noted in visualized veins.  Called results to Benjiman Core, PA    Landry Mellow, RDMS, RVT  04/29/2014, 10:03 AM

## 2014-05-05 ENCOUNTER — Other Ambulatory Visit: Payer: Self-pay | Admitting: Specialist

## 2014-05-05 DIAGNOSIS — M545 Low back pain: Secondary | ICD-10-CM

## 2014-05-06 ENCOUNTER — Ambulatory Visit
Admission: RE | Admit: 2014-05-06 | Discharge: 2014-05-06 | Disposition: A | Discharge status: Home / Self Care | Payer: Commercial Managed Care - HMO | Source: Ambulatory Visit | Attending: Specialist | Admitting: Specialist

## 2014-05-06 DIAGNOSIS — M545 Low back pain: Secondary | ICD-10-CM

## 2014-05-20 ENCOUNTER — Ambulatory Visit: Payer: Commercial Managed Care - HMO | Admitting: Podiatry

## 2014-05-25 ENCOUNTER — Encounter: Payer: Self-pay | Admitting: Vascular Surgery

## 2014-05-26 ENCOUNTER — Ambulatory Visit (INDEPENDENT_AMBULATORY_CARE_PROVIDER_SITE_OTHER): Payer: Commercial Managed Care - HMO | Admitting: Vascular Surgery

## 2014-05-26 ENCOUNTER — Encounter: Payer: Self-pay | Admitting: Vascular Surgery

## 2014-05-26 VITALS — BP 137/84 | HR 103 | Resp 20 | Ht 72.0 in | Wt 360.0 lb

## 2014-05-26 DIAGNOSIS — M5136 Other intervertebral disc degeneration, lumbar region: Secondary | ICD-10-CM | POA: Diagnosis not present
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
## 2014-06-01 ENCOUNTER — Ambulatory Visit (INDEPENDENT_AMBULATORY_CARE_PROVIDER_SITE_OTHER): Payer: Commercial Managed Care - HMO | Admitting: Podiatry

## 2014-06-01 DIAGNOSIS — B351 Tinea unguium: Secondary | ICD-10-CM | POA: Diagnosis not present

## 2014-06-01 DIAGNOSIS — M79676 Pain in unspecified toe(s): Secondary | ICD-10-CM | POA: Diagnosis not present

## 2014-06-01 NOTE — Patient Instructions (Signed)
Diabetes and Foot Care Diabetes may cause you to have problems because of poor blood supply (circulation) to your feet and legs. This may cause the skin on your feet to become thinner, break easier, and heal more slowly. Your skin may become dry, and the skin may peel and crack. You may also have nerve damage in your legs and feet causing decreased feeling in them. You may not notice minor injuries to your feet that could lead to infections or more serious problems. Taking care of your feet is one of the most important things you can do for yourself.  HOME CARE INSTRUCTIONS  Wear shoes at all times, even in the house. Do not go barefoot. Bare feet are easily injured.  Check your feet daily for blisters, cuts, and redness. If you cannot see the bottom of your feet, use a mirror or ask someone for help.  Wash your feet with warm water (do not use hot water) and mild soap. Then pat your feet and the areas between your toes until they are completely dry. Do not soak your feet as this can dry your skin.  Apply a moisturizing lotion or petroleum jelly (that does not contain alcohol and is unscented) to the skin on your feet and to dry, brittle toenails. Do not apply lotion between your toes.  Trim your toenails straight across. Do not dig under them or around the cuticle. File the edges of your nails with an emery board or nail file.  Do not cut corns or calluses or try to remove them with medicine.  Wear clean socks or stockings every day. Make sure they are not too tight. Do not wear knee-high stockings since they may decrease blood flow to your legs.  Wear shoes that fit properly and have enough cushioning. To break in new shoes, wear them for just a few hours a day. This prevents you from injuring your feet. Always look in your shoes before you put them on to be sure there are no objects inside.  Do not cross your legs. This may decrease the blood flow to your feet.  If you find a minor scrape,  cut, or break in the skin on your feet, keep it and the skin around it clean and dry. These areas may be cleansed with mild soap and water. Do not cleanse the area with peroxide, alcohol, or iodine.  When you remove an adhesive bandage, be sure not to damage the skin around it.  If you have a wound, look at it several times a day to make sure it is healing.  Do not use heating pads or hot water bottles. They may burn your skin. If you have lost feeling in your feet or legs, you may not know it is happening until it is too late.  Make sure your health care provider performs a complete foot exam at least annually or more often if you have foot problems. Report any cuts, sores, or bruises to your health care provider immediately. SEEK MEDICAL CARE IF:   You have an injury that is not healing.  You have cuts or breaks in the skin.  You have an ingrown nail.  You notice redness on your legs or feet.  You feel burning or tingling in your legs or feet.  You have pain or cramps in your legs and feet.  Your legs or feet are numb.  Your feet always feel cold. SEEK IMMEDIATE MEDICAL CARE IF:   There is increasing redness,  swelling, or pain in or around a wound.  There is a red line that goes up your leg.  Pus is coming from a wound.  You develop a fever or as directed by your health care provider.  You notice a bad smell coming from an ulcer or wound. Document Released: 01/28/2000 Document Revised: 10/02/2012 Document Reviewed: 07/09/2012 [facility] Patient Information 2015 [place], [place]. This information is not intended to replace advice given to you by your health care provider. Make sure you discuss any questions you have with your health care provider.

## 2014-06-02 NOTE — Progress Notes (Signed)
Patient ID: Phillip Rocha, male   DOB: 04-29-1960, 54 y.o.   MRN: [id number]  Subjective: This patient presents again complaining of painful toenails and requests toenail debridement   Objective: Patient transfers from wheelchair to treatment chair Pending back surgery  The toenails are elongated, hypertrophic, incurvated, discolored and tender to direct palpation 6-10  Assessment: Symptomatic onychomycoses 6-10  Plan: Debridement toenails 10 without any bleeding  Reappoint 3 months

## 2014-06-03 ENCOUNTER — Ambulatory Visit: Payer: Commercial Managed Care - HMO | Admitting: Podiatry

## 2014-08-12 ENCOUNTER — Other Ambulatory Visit: Payer: Self-pay | Admitting: Internal Medicine

## 2014-08-12 DIAGNOSIS — R591 Generalized enlarged lymph nodes: Secondary | ICD-10-CM

## 2014-08-18 ENCOUNTER — Ambulatory Visit
Admission: RE | Admit: 2014-08-18 | Discharge: 2014-08-18 | Disposition: A | Discharge status: Home / Self Care | Payer: Commercial Managed Care - HMO | Source: Ambulatory Visit | Attending: Internal Medicine | Admitting: Internal Medicine

## 2014-08-18 DIAGNOSIS — R591 Generalized enlarged lymph nodes: Secondary | ICD-10-CM

## 2014-08-18 MED ORDER — IOPAMIDOL (ISOVUE-300) INJECTION 61%
125.0000 mL | Freq: Once | INTRAVENOUS | Status: AC | PRN
  Administered 2014-08-18: 125 mL via INTRAVENOUS

## 2014-08-20 NOTE — Patient Outreach (Signed)
[facility]) Care Management  08/20/2014  Phillip Rocha 04-24-1960 [id number]   Referral from [facility] Tier 4 list and assigned to Maury Dus, RN for patient outreach.  Daemien Fronczak L. [name] Care Management Assistant [phone]

## 2014-08-24 ENCOUNTER — Telehealth: Payer: Self-pay | Admitting: Hematology

## 2014-08-24 NOTE — Telephone Encounter (Signed)
new patient appt-s/w patient and gave np appt for 07/12 @ 10:30 w/Dr. Irene Limbo.  Referring Dr. Velna Hatchet Dx- Progression of Lymphadenopathy

## 2014-08-25 ENCOUNTER — Telehealth: Payer: Self-pay | Admitting: Hematology

## 2014-08-25 ENCOUNTER — Ambulatory Visit (HOSPITAL_BASED_OUTPATIENT_CLINIC_OR_DEPARTMENT_OTHER): Payer: Commercial Managed Care - HMO

## 2014-08-25 ENCOUNTER — Encounter: Payer: Self-pay | Admitting: Hematology

## 2014-08-25 ENCOUNTER — Ambulatory Visit (HOSPITAL_BASED_OUTPATIENT_CLINIC_OR_DEPARTMENT_OTHER): Payer: Commercial Managed Care - HMO | Admitting: Hematology

## 2014-08-25 ENCOUNTER — Ambulatory Visit: Payer: Commercial Managed Care - HMO

## 2014-08-25 VITALS — BP 146/90 | HR 78 | Temp 98.0°F | Resp 22 | Ht 72.0 in | Wt 385.1 lb

## 2014-08-25 DIAGNOSIS — R590 Localized enlarged lymph nodes: Secondary | ICD-10-CM

## 2014-08-25 DIAGNOSIS — L989 Disorder of the skin and subcutaneous tissue, unspecified: Secondary | ICD-10-CM

## 2014-08-25 DIAGNOSIS — R591 Generalized enlarged lymph nodes: Secondary | ICD-10-CM

## 2014-08-25 HISTORY — DX: Generalized enlarged lymph nodes: R59.1

## 2014-08-25 LAB — SEDIMENTATION RATE: SED RATE: 30 mm/h — AB (ref 0–20)

## 2014-08-25 LAB — COMPREHENSIVE METABOLIC PANEL (CC13)
ALT: 17 U/L (ref 0–55)
AST: 16 U/L (ref 5–34)
Albumin: 3.7 g/dL (ref 3.5–5.0)
Alkaline Phosphatase: 112 U/L (ref 40–150)
Anion Gap: 9 mEq/L (ref 3–11)
BUN: 11.1 mg/dL (ref 7.0–26.0)
CALCIUM: 9.6 mg/dL (ref 8.4–10.4)
CO2: 29 mEq/L (ref 22–29)
Chloride: 101 mEq/L (ref 98–109)
Creatinine: 0.9 mg/dL (ref 0.7–1.3)
EGFR: >90 mL/min/{1.73_m2} (ref >90)
Glucose: 128 mg/dl (ref 70–140)
POTASSIUM: 4.5 meq/L (ref 3.5–5.1)
Sodium: 139 mEq/L (ref 136–145)
Total Bilirubin: 0.36 mg/dL (ref 0.20–1.20)
Total Protein: 7.7 g/dL (ref 6.4–8.3)

## 2014-08-25 LAB — CBC WITH DIFFERENTIAL/PLATELET
BASO%: 0.9 % (ref 0.0–2.0)
BASOS ABS: 0.1 10*3/uL (ref 0.0–0.1)
EOS%: 3.2 % (ref 0.0–7.0)
Eosinophils Absolute: 0.3 10*3/uL (ref 0.0–0.5)
HEMATOCRIT: 37.7 % — AB (ref 38.4–49.9)
HGB: 11.6 g/dL — ABNORMAL LOW (ref 13.0–17.1)
LYMPH#: 2 10*3/uL (ref 0.9–3.3)
LYMPH%: 19.1 % (ref 14.0–49.0)
MCH: 22.8 pg — ABNORMAL LOW (ref 27.2–33.4)
MCHC: 30.7 g/dL — AB (ref 32.0–36.0)
MCV: 74.1 fL — ABNORMAL LOW (ref 79.3–98.0)
MONO#: 0.7 10*3/uL (ref 0.1–0.9)
MONO%: 6.4 % (ref 0.0–14.0)
NEUT#: 7.3 10*3/uL — ABNORMAL HIGH (ref 1.5–6.5)
NEUT%: 70.4 % (ref 39.0–75.0)
Platelets: 280 10*3/uL (ref 140–400)
RBC: 5.08 10*6/uL (ref 4.20–5.82)
RDW: 16.6 % — ABNORMAL HIGH (ref 11.0–14.6)
WBC: 10.4 10*3/uL — ABNORMAL HIGH (ref 4.0–10.3)

## 2014-08-25 LAB — LACTATE DEHYDROGENASE (CC13): LDH: 200 U/L (ref 125–245)

## 2014-08-25 NOTE — Assessment & Plan Note (Signed)
Patient referred for gradually enlarging inguinal and external iliac lymph nodes. These have gradually increased from his CT scan in December 2015 through his recent CT scan of his abdomen and pelvis. These could be likely reactive in the setting all chronic inflammation from his psoriasis and psoriatic arthropathy for which he has been on several different biologics over the last 4-5 years.  Other less likely possibilities could be a low-grade lymphoma that is gradually growing. Other infectious processes.  Biologic immunomodulator treatments can increase risk of lymphoma. This is unlikely the case here since the resulting lymphomas in this situation tend to be aggressive EBV related lymphomas which one would have expected to growth much faster. Plan -Discussed the findings of the CT scan with the patient and offered him a wait and watch approach with a repeat imaging in 3-6 months versus a more aggressive diagnostic approach provided determined the definitive etiology of his enlarged lymph nodes patient is very clear that he wants to follow an aggressive diagnostic approach. -We shall get a CT of his neck chest abdomen pelvis with contrast to check for other significant lymphadenopathy. -I will refer him to interventional radiology for an ultrasound-guided core needle biopsy of his most prominent inguinal lymph node unless the repeat imaging shows other significant lymphadenopathy that might write a better diagnostic yield. The diagnostic sample would need to be sent for lymphoma workup in addition to standard surgical pathology and adequate sample is available for microbiology as well.  - if his core needle biopsy is nondiagnostic he may need surgical excision biopsy of one of his lymph nodes though his case with his morbid obesity this might be somewhat more challenging. -Will get a basic labs CBC, CMP today, HIV testing, hepatitis profile, EBV PCR. -LDH level and sedimentation rate -Return to clinic  in 2 weeks with results of his CT scan and biopsy results to determine further management.

## 2014-08-25 NOTE — Patient Instructions (Addendum)
-  labs today -CT chest/abd/pelvis and neck ( hold metformin the day off and then 48 hours after contrast dye) -Return to clinic in 2 weeks

## 2014-08-25 NOTE — Progress Notes (Signed)
Checked in new pt with no financial concerns prior to seeing the dr.  Abbott Rocha has my card for any billing questions, concerns or if financial assistance is needed.
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
## 2014-08-25 NOTE — Telephone Encounter (Signed)
Pt confirmed labs/ov per 07/12 POF, gave pt AVS and Calendar... KJ

## 2014-08-26 LAB — HIV ANTIBODY (ROUTINE TESTING W REFLEX): HIV 1&2 Ab, 4th Generation: NONREACTIVE

## 2014-08-26 LAB — HEPATITIS B CORE ANTIBODY, TOTAL: Hep B Core Total Ab: NONREACTIVE

## 2014-08-26 LAB — HEPATITIS C ANTIBODY: HCV AB: NEGATIVE

## 2014-08-26 LAB — HEPATITIS B SURFACE ANTIGEN: Hepatitis B Surface Ag: NEGATIVE

## 2014-08-27 ENCOUNTER — Other Ambulatory Visit: Payer: Self-pay

## 2014-08-27 NOTE — Patient Outreach (Signed)
[facility]) Care Management  08/27/2014  Phillip Rocha [birth year] [id number]     RN CM spoke briefly with patient and explained the services of [facility].  Patient stated he was waiting on a call from his doctor or pharmacy regarding a medication for his gout.  Patient stated he was sorry but he is not able to concentrate on what Case Manager was telling him.  Patient stated he was in a lot of pain.  Patient stated if he needs our services he wold let his doctor know.  RN CM will close this case and send a letter to patient with information about the services of [facility].    Maury Dus, RN, Ishmael Holter, [place] Telephonic Care Coordinator [phone]

## 2014-08-31 ENCOUNTER — Telehealth: Payer: Self-pay

## 2014-08-31 ENCOUNTER — Telehealth: Payer: Self-pay | Admitting: *Deleted

## 2014-08-31 DIAGNOSIS — R590 Localized enlarged lymph nodes: Secondary | ICD-10-CM

## 2014-08-31 NOTE — Telephone Encounter (Signed)
Vanda from radiology called that Dr Irene Limbo ordered CT neck, chest, abd, and pelvis being done on 7/21. There was a CT abd, pelvis done on 08/18/14. Does Dr Irene Limbo want abd, pelvis repeated. Call Milnor back at [phone]

## 2014-08-31 NOTE — Telephone Encounter (Signed)
Radiology called requesting CT Contrast be placed at front desk for patients driver to pick up as patient is in a wheelchair.  Doesn't know when he'll come by.  Mammie will instruct patient to call radiology and they will call [facility] to place contrast at registration desk.  CT scheduled on 09-03-2014.

## 2014-08-31 NOTE — Addendum Note (Signed)
Addended by: Sullivan Lone on: 08/31/2014 03:07 PM   Modules accepted: Orders

## 2014-09-01 ENCOUNTER — Telehealth: Payer: Self-pay | Admitting: *Deleted

## 2014-09-01 DIAGNOSIS — F40240 Claustrophobia: Secondary | ICD-10-CM

## 2014-09-01 MED ORDER — DIAZEPAM 5 MG PO TABS: 5.0000 mg | ORAL_TABLET | ORAL | Status: DC

## 2014-09-01 NOTE — Telephone Encounter (Signed)
Informed patient that prescription for valium is ready for pick up. Patient verbalized understanding.

## 2014-09-01 NOTE — Telephone Encounter (Signed)
VM message from patient received @ 9:26 am requesting a prescription for  One time valium for upcoming CT scan on Thursday, 09/03/14. He states he gets very claustrophic and has a hard time getting in CT scan machine.  He uses [facility] on [address].

## 2014-09-03 ENCOUNTER — Ambulatory Visit (HOSPITAL_COMMUNITY): Payer: Commercial Managed Care - HMO

## 2014-09-07 ENCOUNTER — Other Ambulatory Visit: Payer: Self-pay | Admitting: Hematology

## 2014-09-07 ENCOUNTER — Ambulatory Visit (HOSPITAL_COMMUNITY)
Admission: RE | Admit: 2014-09-07 | Discharge: 2014-09-07 | Disposition: A | Discharge status: Home / Self Care | Payer: Commercial Managed Care - HMO | Source: Ambulatory Visit | Attending: Hematology | Admitting: Hematology

## 2014-09-07 ENCOUNTER — Other Ambulatory Visit: Payer: Self-pay | Admitting: *Deleted

## 2014-09-07 ENCOUNTER — Encounter (HOSPITAL_COMMUNITY): Payer: Self-pay

## 2014-09-07 DIAGNOSIS — R590 Localized enlarged lymph nodes: Secondary | ICD-10-CM | POA: Insufficient documentation

## 2014-09-07 DIAGNOSIS — R591 Generalized enlarged lymph nodes: Secondary | ICD-10-CM

## 2014-09-07 MED ORDER — IOHEXOL 300 MG/ML  SOLN
100.0000 mL | Freq: Once | INTRAMUSCULAR | Status: AC | PRN
  Administered 2014-09-07: 100 mL via INTRAVENOUS

## 2014-09-08 ENCOUNTER — Telehealth: Payer: Self-pay | Admitting: Hematology

## 2014-09-08 ENCOUNTER — Other Ambulatory Visit: Payer: Self-pay | Admitting: Radiology

## 2014-09-08 ENCOUNTER — Encounter: Payer: Self-pay | Admitting: Hematology

## 2014-09-08 ENCOUNTER — Ambulatory Visit (HOSPITAL_BASED_OUTPATIENT_CLINIC_OR_DEPARTMENT_OTHER): Payer: Commercial Managed Care - HMO | Admitting: Hematology

## 2014-09-08 VITALS — BP 147/79 | HR 95 | Temp 98.3°F | Resp 20 | Ht 72.0 in | Wt 386.7 lb

## 2014-09-08 DIAGNOSIS — M549 Dorsalgia, unspecified: Secondary | ICD-10-CM | POA: Diagnosis not present

## 2014-09-08 DIAGNOSIS — R591 Generalized enlarged lymph nodes: Secondary | ICD-10-CM

## 2014-09-08 DIAGNOSIS — G4730 Sleep apnea, unspecified: Secondary | ICD-10-CM

## 2014-09-08 DIAGNOSIS — L989 Disorder of the skin and subcutaneous tissue, unspecified: Secondary | ICD-10-CM

## 2014-09-08 DIAGNOSIS — R5383 Other fatigue: Secondary | ICD-10-CM

## 2014-09-08 DIAGNOSIS — D509 Iron deficiency anemia, unspecified: Secondary | ICD-10-CM | POA: Diagnosis not present

## 2014-09-08 DIAGNOSIS — G4700 Insomnia, unspecified: Secondary | ICD-10-CM

## 2014-09-08 NOTE — Telephone Encounter (Signed)
Per 07/26 POF, pt to return with f/u after Biopsy, gave pt AVS.... Cherylann Banas
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
## 2014-09-08 NOTE — Patient Instructions (Signed)
US Biopsy 7/27 at [facility].  Please check in at 10:45.  Nothing to eat or drink after 7am.  Must not be on blood thinners.  Please have a driver with you.

## 2014-09-08 NOTE — Patient Outreach (Signed)
[name]) Care Management  09/08/2014  Phillip Rocha 03-13-1960 [id number]   RN CM sent a letter to patient regarding the services of [facility].  Patient has not responded to calls or letter. RN CM has been unable to establish contact or maintain contact with this patient. RN CM will close this case and notify primary doctor of case closure.  Maury Dus, RN, Ishmael Holter, [place] Telephonic Care Coordinator [phone]

## 2014-09-08 NOTE — Addendum Note (Signed)
Addended by: Sullivan Lone on: 09/08/2014 02:43 PM   Modules accepted: Miquel Dunn

## 2014-09-08 NOTE — Telephone Encounter (Signed)
Left message to confirm lab appointment 07/27

## 2014-09-09 ENCOUNTER — Other Ambulatory Visit: Payer: Self-pay | Admitting: Hematology

## 2014-09-09 ENCOUNTER — Ambulatory Visit (HOSPITAL_COMMUNITY)
Admission: RE | Admit: 2014-09-09 | Discharge: 2014-09-09 | Disposition: A | Discharge status: Home / Self Care | Payer: Commercial Managed Care - HMO | Source: Ambulatory Visit | Attending: Hematology | Admitting: Hematology

## 2014-09-09 ENCOUNTER — Other Ambulatory Visit (HOSPITAL_BASED_OUTPATIENT_CLINIC_OR_DEPARTMENT_OTHER): Payer: Commercial Managed Care - HMO

## 2014-09-09 ENCOUNTER — Encounter (HOSPITAL_COMMUNITY): Payer: Self-pay

## 2014-09-09 DIAGNOSIS — R591 Generalized enlarged lymph nodes: Secondary | ICD-10-CM

## 2014-09-09 DIAGNOSIS — L4050 Arthropathic psoriasis, unspecified: Secondary | ICD-10-CM | POA: Insufficient documentation

## 2014-09-09 DIAGNOSIS — D509 Iron deficiency anemia, unspecified: Secondary | ICD-10-CM

## 2014-09-09 DIAGNOSIS — Z6841 Body Mass Index (BMI) 40.0 and over, adult: Secondary | ICD-10-CM | POA: Insufficient documentation

## 2014-09-09 DIAGNOSIS — Z79899 Other long term (current) drug therapy: Secondary | ICD-10-CM | POA: Insufficient documentation

## 2014-09-09 LAB — CBC WITH DIFFERENTIAL/PLATELET
BASOS ABS: 0 10*3/uL (ref 0.0–0.1)
Basophils Relative: 1 % (ref 0–1)
EOS PCT: 4 % (ref 0–5)
Eosinophils Absolute: 0.3 10*3/uL (ref 0.0–0.7)
HEMATOCRIT: 37.3 % — AB (ref 39.0–52.0)
HEMOGLOBIN: 11.3 g/dL — AB (ref 13.0–17.0)
LYMPHS ABS: 2.5 10*3/uL (ref 0.7–4.0)
Lymphocytes Relative: 31 % (ref 12–46)
MCH: 23.3 pg — AB (ref 26.0–34.0)
MCHC: 30.3 g/dL (ref 30.0–36.0)
MCV: 76.7 fL — AB (ref 78.0–100.0)
MONO ABS: 0.6 10*3/uL (ref 0.1–1.0)
Monocytes Relative: 7 % (ref 3–12)
Neutro Abs: 4.6 10*3/uL (ref 1.7–7.7)
Neutrophils Relative %: 57 % (ref 43–77)
Platelets: 251 10*3/uL (ref 150–400)
RBC: 4.86 MIL/uL (ref 4.22–5.81)
RDW: 16.2 % — AB (ref 11.5–15.5)
WBC: 8.1 10*3/uL (ref 4.0–10.5)

## 2014-09-09 LAB — PROTIME-INR
INR: 0.9 (ref 0.00–1.49)
Prothrombin Time: 12.4 seconds (ref 11.6–15.2)

## 2014-09-09 LAB — EPSTEIN-BARR VIRUS VCA, IGM

## 2014-09-09 LAB — IRON AND TIBC CHCC
%SAT: 14 % — AB (ref 20–55)
Iron: 53 ug/dL (ref 42–163)
TIBC: 365 ug/dL (ref 202–409)
UIBC: 312 ug/dL (ref 117–376)

## 2014-09-09 LAB — FERRITIN CHCC: FERRITIN: 32 ng/mL (ref 22–316)

## 2014-09-09 LAB — EPSTEIN-BARR VIRUS VCA, IGG

## 2014-09-09 MED ORDER — FENTANYL CITRATE (PF) 100 MCG/2ML IJ SOLN
INTRAMUSCULAR | Status: AC | PRN
  Administered 2014-09-09: 50 ug via INTRAVENOUS

## 2014-09-09 MED ORDER — NALOXONE HCL 0.4 MG/ML IJ SOLN
INTRAMUSCULAR | Status: AC
  Filled 2014-09-09: qty 1

## 2014-09-09 MED ORDER — FLUMAZENIL 0.5 MG/5ML IV SOLN
INTRAVENOUS | Status: AC
  Filled 2014-09-09: qty 5

## 2014-09-09 MED ORDER — MIDAZOLAM HCL 2 MG/2ML IJ SOLN
INTRAMUSCULAR | Status: AC | PRN
  Administered 2014-09-09: 1 mg via INTRAVENOUS
  Administered 2014-09-09: 0.5 mg via INTRAVENOUS

## 2014-09-09 MED ORDER — OXYCODONE HCL 5 MG PO TABS
10.0000 mg | ORAL_TABLET | Freq: Once | ORAL | Status: AC
  Administered 2014-09-09: 10 mg via ORAL
  Filled 2014-09-09: qty 2

## 2014-09-09 MED ORDER — FENTANYL CITRATE (PF) 100 MCG/2ML IJ SOLN
INTRAMUSCULAR | Status: AC
  Filled 2014-09-09: qty 2

## 2014-09-09 MED ORDER — MIDAZOLAM HCL 2 MG/2ML IJ SOLN
INTRAMUSCULAR | Status: AC
  Filled 2014-09-09: qty 4

## 2014-09-09 MED ORDER — SODIUM CHLORIDE 0.9 % IV SOLN
INTRAVENOUS | Status: DC
  Administered 2014-09-09: 12:00:00 via INTRAVENOUS

## 2014-09-09 NOTE — Addendum Note (Signed)
Addended by: Sullivan Lone on: 09/09/2014 08:56 AM   Modules accepted: Orders

## 2014-09-09 NOTE — Discharge Instructions (Signed)
Fine Needle Aspiration Fine needle aspiration is a procedure used to remove a piece of tissue. The tissue may be removed from a swelling or abnormal growth (tumor). It is also used to confirm a cyst. A cyst is a fluid filled sac. The procedure may be done by your caregiver or a specialist. LET YOUR CAREGIVER KNOW ABOUT:   Any medications you take, especially blood thinners like aspirin.  Any problems you may have had with similar procedures in the past. RISKS AND COMPLICATIONS This is a safe procedure. There is a very small risk of infection and or bleeding. Other complications can occur if the aspiration site is deep in the body. Your caregiver or specialist will explain this to you.  BEFORE THE PROCEDURE   No special preparation is needed in most cases. Your caregiver will let you know if there are special requirements, such as an empty stomach.  Be sure to ask your caregiver any questions before the procedure starts. PROCEDURE   This procedure is done under local or no anesthetic.  The skin is cleaned carefully.  A thin needle is directed into a lump. The needle is directed in several different directions into the lump while suction is applied to the needle. When samples are removed, the needle is withdrawn.  The contents obtained are placed on a slide. They are then fixed, stained and examined under the microscope. The slide is examined by a specialist in the examination of tissue (pathologist).  A diagnosis can then be made. The pathologist will decide if the specimen is cancerous (malignant) or not cancerous (benign). If fluid is taken from a cyst, cells from the fluid can be examined. If no material is obtained from a fine needle aspiration, the sample may not rule out a problem. Sometimes the procedure is done again. The pathologist may need several days before a result is available. AFTER THE PROCEDURE   There are usually no limits on diet or activity after a fine needle  aspiration.  Your caregiver may give you instructions regarding the aspiration site. This will include information about keeping the site clean and dry. Follow these instructions carefully.  Call for your test results as instructed by your caregiver. Remember, it is your responsibility to get the results of your testing. Do not think everything is fine if you have not heard from your caregiver.  Keep a close watch on the aspiration site. Report any redness, swelling or drainage.  You should not have much pain at the aspiration site.  Take any medications as told by your caregiver. SEEK MEDICAL CARE IF:   You have pain or drainage at the aspiration site that does not go away.  Swelling at the aspiration site does not gradually go away. SEEK IMMEDIATE MEDICAL CARE IF:   You develop a fever a day or two after the aspiration.  You develop severe pain at the aspiration site.  You develop a warm, tender swelling at the aspiration site. Document Released: 01/28/2000 Document Revised: 04/24/2011 Document Reviewed: 10/11/2007 [facility] Patient Information 2015 [place], [place]. This information is not intended to replace advice given to you by your health care provider. Make sure you discuss any questions you have with your health care provider. Conscious Sedation Sedation is the use of medicines to promote relaxation and relieve discomfort and anxiety. Conscious sedation is a type of sedation. Under conscious sedation you are less alert than normal but are still able to respond to instructions or stimulation. Conscious sedation is used during  short medical and dental procedures. It is milder than deep sedation or general anesthesia and allows you to return to your regular activities sooner.  LET [facility] CARE PROVIDER KNOW ABOUT:   Any allergies you have.  All medicines you are taking, including vitamins, herbs, eye drops, creams, and over-the-counter medicines.  Use of steroids (by mouth  or creams).  Previous problems you or members of your family have had with the use of anesthetics.  Any blood disorders you have.  Previous surgeries you have had.  Medical conditions you have.  Possibility of pregnancy, if this applies.  Use of cigarettes, alcohol, or illegal drugs. RISKS AND COMPLICATIONS Generally, this is a safe procedure. However, as with any procedure, problems can occur. Possible problems include:  Oversedation.  Trouble breathing on your own. You may need to have a breathing tube until you are awake and breathing on your own.  Allergic reaction to any of the medicines used for the procedure. BEFORE THE PROCEDURE  You may have blood tests done. These tests can help show how well your kidneys and liver are working. They can also show how well your blood clots.  A physical exam will be done.  Only take medicines as directed by your health care provider. You may need to stop taking medicines (such as blood thinners, aspirin, or nonsteroidal anti-inflammatory drugs) before the procedure.   Do not eat or drink at least 6 hours before the procedure or as directed by your health care provider.  Arrange for a responsible adult, family member, or friend to take you home after the procedure. He or she should stay with you for at least 24 hours after the procedure, until the medicine has worn off. PROCEDURE   An intravenous (IV) catheter will be inserted into one of your veins. Medicine will be able to flow directly into your body through this catheter. You may be given medicine through this tube to help prevent pain and help you relax.  The medical or dental procedure will be done. AFTER THE PROCEDURE  You will stay in a recovery area until the medicine has worn off. Your blood pressure and pulse will be checked.   Depending on the procedure you had, you may be allowed to go home when you can tolerate liquids and your pain is under control. Document  Released: 10/25/2000 Document Revised: 02/04/2013 Document Reviewed: 10/07/2012 [facility] Patient Information 2015 [place], [place]. This information is not intended to replace advice given to you by your health care provider. Make sure you discuss any questions you have with your health care provider.

## 2014-09-09 NOTE — Procedures (Signed)
Interventional Radiology Procedure Note  Procedure:  Ultrasound guided core biopsy of left inguinal lymph node  Complications: None  Estimated Blood Loss: <25 mL  Enlarged left inguinal node sampled under US guidance.  18 G core biopsy x 5.  Samples submitted in saline.  Venetia Night. Kathlene Cote, M.D Pager:  [phone]

## 2014-09-09 NOTE — H&P (Signed)
HPI: Patient with generalized lymphadenopathy who has been seen by Oncology and scheduled today for image guided lymph node biopsy.   The patient has had a H&P performed within the last 30 days, all history, medications, and exam have been reviewed. The patient denies any interval changes since the H&P.  Medications: Prior to Admission medications   Medication Sig Start Date End Date Taking? Authorizing Provider  allopurinol (ZYLOPRIM) 100 MG tablet Take 100 mg by mouth 2 (two) times daily.   Yes Historical Provider, MD  amLODipine (NORVASC) 10 MG tablet Take 10 mg by mouth every morning.    Yes Historical Provider, MD  cetirizine (ZYRTEC) 10 MG tablet Take 10 mg by mouth daily as needed (Itching).   Yes Historical Provider, MD  clobetasol ointment (TEMOVATE) 7.32 % Apply 1 application topically daily as needed (for psoriasis= apply to whole body= mix with aquaphor ointment).   Yes Historical Provider, MD  Cyanocobalamin (VITAMIN B-12 PO) Take 1 tablet by mouth daily.  01/13/14  Yes Historical Provider, MD  DYMISTA 137-50 MCG/ACT SUSP Place 1 spray into the nose daily.  06/18/14  Yes Historical Provider, MD  hydrOXYzine (ATARAX/VISTARIL) 50 MG tablet Take 2 tablets (100 mg total) by mouth 3 (three) times daily as needed (itching). 08/22/13  Yes Scott Marcene Duos, MD  losartan (COZAAR) 50 MG tablet Take 50 mg by mouth every morning.    Yes Historical Provider, MD  Melatonin 5 MG CAPS Take 5 mg by mouth at bedtime.  07/15/14  Yes Historical Provider, MD  metFORMIN (GLUCOPHAGE) 1000 MG tablet Take 1,000 mg by mouth 2 (two) times daily with a meal.   Yes Historical Provider, MD  mineral oil-hydrophilic petrolatum (AQUAPHOR) ointment Apply 1 application topically daily as needed for dry skin (for psoriasis= apply to whole body= mix with clobetasol 0.05% ointment).   Yes Historical Provider, MD  montelukast (SINGULAIR) 10 MG tablet Take 10 mg by mouth at bedtime.  08/21/14  Yes Historical Provider, MD    morphine (MS CONTIN) 15 MG 12 hr tablet Take 15 mg by mouth every 12 (twelve) hours.   Yes Historical Provider, MD  omeprazole (PRILOSEC) 40 MG capsule Take 40 mg by mouth daily.   Yes Historical Provider, MD  oxyCODONE (ROXICODONE) 30 MG immediate release tablet Take 1 tablet (30 mg total) by mouth 5 (five) times daily. 08/22/13  Yes Scott Marcene Duos, MD  sertraline (ZOLOFT) 25 MG tablet Take 25 mg by mouth 2 (two) times daily.    Yes Historical Provider, MD  tiZANidine (ZANAFLEX) 4 MG tablet Take 4 mg by mouth 3 (three) times daily.  02/09/14  Yes Historical Provider, MD  vitamin C (ASCORBIC ACID) 500 MG tablet Take 500 mg by mouth daily.  01/13/14  Yes Historical Provider, MD  zolpidem (AMBIEN) 10 MG tablet Take 10 mg by mouth at bedtime as needed for sleep.   Yes Historical Provider, MD  diazepam (VALIUM) 5 MG tablet Take 1 tablet (5 mg total) by mouth 30 (thirty) minutes before procedure. 09/01/14   Brunetta Genera, MD    Vital Signs: BP 140/77 mmHg  Pulse 74  Temp(Src) 97.6 F (36.4 C) (Oral)  Resp 18  Ht 5' 11" (1.803 m)  Wt 389 lb (176.449 kg)  BMI 54.28 kg/m2  SpO2 95%  Physical Exam  Constitutional: He is oriented to person, place, and time. No distress.  HENT:  Head: Normocephalic and atraumatic.  Cardiovascular: Normal rate and regular rhythm.  Exam reveals no gallop  and no friction rub.   No murmur heard. Pulmonary/Chest: Effort normal and breath sounds normal. No respiratory distress. He has no wheezes. He has no rales.  Abdominal: Soft. Bowel sounds are normal.  Musculoskeletal: He exhibits edema.  Neurological: He is alert and oriented to person, place, and time.  Skin: He is not diaphoretic.    Mallampati Score:  MD Evaluation Airway: WNL Heart: WNL Abdomen: WNL Chest/ Lungs: WNL ASA  Classification: 2 Mallampati/Airway Score: Two  Labs:  CBC:  Recent Labs  09/23/13 2021 08/25/14 1219 09/09/14 1148  WBC 10.6* 10.4* 8.1  HGB 11.2* 11.6* 11.3*   HCT 36.8* 37.7* 37.3*  PLT 305 280 251    COAGS:  Recent Labs  09/09/14 1148  INR 0.90    BMP:  Recent Labs  09/23/13 2014 08/25/14 1220  NA 138 139  K 4.2 4.5  CL 100  --   CO2 24 29  GLUCOSE 94 128  BUN 14 11.1  CALCIUM 9.2 9.6  CREATININE 0.88 0.9  GFRNONAA >90  --   GFRAA >90  --     LIVER FUNCTION TESTS:  Recent Labs  09/23/13 2014 08/25/14 1220  BILITOT 0.3 0.36  AST 16 16  ALT 16 17  ALKPHOS 116 112  PROT 7.6 7.7  ALBUMIN 3.8 3.7    Assessment/Plan:  Lymphadenopathy  Seen by Oncology 09/08/14 Scheduled for image guided inguinal lymph node biopsy with sedation The patient has been NPO, no blood thinners taken, labs and vitals have been reviewed. Risks and Benefits discussed with the patient including, but not limited to bleeding, infection, damage to adjacent structures or low yield requiring additional tests. All of the patient's questions were answered, patient is agreeable to proceed. Consent signed and in chart. Psoriatic arthritis  Morbid obesity    Signed: Hedy Jacob 09/09/2014, 12:53 PM

## 2014-09-09 NOTE — Patient Outreach (Signed)
[facility]) Care Management  09/09/2014  Phillip Rocha October 09, 1960 [id number]   Notification from Maury Dus, RN to close case due to unable to contact patient for [place] Management services.  Ronnell Freshwater. [place], [place] Management Jay Assistant Phone: [phone] Fax: [phone]

## 2014-09-11 ENCOUNTER — Telehealth: Payer: Self-pay | Admitting: Hematology

## 2014-09-11 NOTE — Telephone Encounter (Signed)
I called Mr. Phillip Rocha with his lymph node biopsy results which showed benign dermatopathic changes likely secondary to his chronic inflammatory state. No evidence of malignancy or lymphoma patient is okay to pursue his back surgery from a Hematology/Oncology standpoint.

## 2014-09-14 ENCOUNTER — Ambulatory Visit: Payer: Commercial Managed Care - HMO | Admitting: Podiatry

## 2014-09-15 ENCOUNTER — Ambulatory Visit (INDEPENDENT_AMBULATORY_CARE_PROVIDER_SITE_OTHER): Payer: Medicare HMO | Admitting: Podiatry

## 2014-09-15 DIAGNOSIS — M79676 Pain in unspecified toe(s): Secondary | ICD-10-CM

## 2014-09-15 DIAGNOSIS — B351 Tinea unguium: Secondary | ICD-10-CM

## 2014-09-15 NOTE — Progress Notes (Signed)
Patient ID: Phillip Rocha, male   DOB: 04-21-1960, 54 y.o.   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 These areas were dressed with antibiotic ointment and gauze dressings. Patient was them made aware that he had bleeding from these 2 sites and to leave bandages on 1-2 days and then to continue apply topical antibiotic ointment daily until a scab forms  Reappoint at three-month intervals

## 2014-09-15 NOTE — Patient Instructions (Signed)
Leave the bandages on second and fourth right toes 1-2 days. After removing bandages apply topical antibiotic ointment daily to second and fourth right toes until a scab forms  Diabetes and Foot Care Diabetes may cause you to have problems because of poor blood supply (circulation) to your feet and legs. This may cause the skin on your feet to become thinner, break easier, and heal more slowly. Your skin may become dry, and the skin may peel and crack. You may also have nerve damage in your legs and feet causing decreased feeling in them. You may not notice minor injuries to your feet that could lead to infections or more serious problems. Taking care of your feet is one of the most important things you can do for yourself.  HOME CARE INSTRUCTIONS  Wear shoes at all times, even in the house. Do not go barefoot. Bare feet are easily injured.  Check your feet daily for blisters, cuts, and redness. If you cannot see the bottom of your feet, use a mirror or ask someone for help.  Wash your feet with warm water (do not use hot water) and mild soap. Then pat your feet and the areas between your toes until they are completely dry. Do not soak your feet as this can dry your skin.  Apply a moisturizing lotion or petroleum jelly (that does not contain alcohol and is unscented) to the skin on your feet and to dry, brittle toenails. Do not apply lotion between your toes.  Trim your toenails straight across. Do not dig under them or around the cuticle. File the edges of your nails with an emery board or nail file.  Do not cut corns or calluses or try to remove them with medicine.  Wear clean socks or stockings every day. Make sure they are not too tight. Do not wear knee-high stockings since they may decrease blood flow to your legs.  Wear shoes that fit properly and have enough cushioning. To break in new shoes, wear them for just a few hours a day. This prevents you from injuring your feet. Always look in  your shoes before you put them on to be sure there are no objects inside.  Do not cross your legs. This may decrease the blood flow to your feet.  If you find a minor scrape, cut, or break in the skin on your feet, keep it and the skin around it clean and dry. These areas may be cleansed with mild soap and water. Do not cleanse the area with peroxide, alcohol, or iodine.  When you remove an adhesive bandage, be sure not to damage the skin around it.  If you have a wound, look at it several times a day to make sure it is healing.  Do not use heating pads or hot water bottles. They may burn your skin. If you have lost feeling in your feet or legs, you may not know it is happening until it is too late.  Make sure your health care provider performs a complete foot exam at least annually or more often if you have foot problems. Report any cuts, sores, or bruises to your health care provider immediately. SEEK MEDICAL CARE IF:   You have an injury that is not healing.  You have cuts or breaks in the skin.  You have an ingrown nail.  You notice redness on your legs or feet.  You feel burning or tingling in your legs or feet.  You have pain or  cramps in your legs and feet.  Your legs or feet are numb.  Your feet always feel cold. SEEK IMMEDIATE MEDICAL CARE IF:   There is increasing redness, swelling, or pain in or around a wound.  There is a red line that goes up your leg.  Pus is coming from a wound.  You develop a fever or as directed by your health care provider.  You notice a bad smell coming from an ulcer or wound. Document Released: 01/28/2000 Document Revised: 10/02/2012 Document Reviewed: 07/09/2012 [facility] Patient Information 2015 [place], [place]. This information is not intended to replace advice given to you by your health care provider. Make sure you discuss any questions you have with your health care provider.

## 2014-10-23 ENCOUNTER — Other Ambulatory Visit (HOSPITAL_COMMUNITY): Payer: Self-pay | Admitting: Specialist

## 2014-10-24 DIAGNOSIS — J309 Allergic rhinitis, unspecified: Principal | ICD-10-CM

## 2014-10-24 DIAGNOSIS — G4730 Sleep apnea, unspecified: Secondary | ICD-10-CM | POA: Insufficient documentation

## 2014-10-24 DIAGNOSIS — H1010 Acute atopic conjunctivitis, unspecified eye: Secondary | ICD-10-CM | POA: Insufficient documentation

## 2014-11-03 ENCOUNTER — Emergency Department (HOSPITAL_COMMUNITY)
Admission: EM | Admit: 2014-11-03 | Discharge: 2014-11-03 | Discharge status: Against Medical Advice | Payer: Medicare HMO | Attending: Emergency Medicine | Admitting: Emergency Medicine

## 2014-11-03 ENCOUNTER — Encounter (HOSPITAL_COMMUNITY): Payer: Self-pay

## 2014-11-03 DIAGNOSIS — E119 Type 2 diabetes mellitus without complications: Secondary | ICD-10-CM | POA: Insufficient documentation

## 2014-11-03 DIAGNOSIS — S3992XA Unspecified injury of lower back, initial encounter: Secondary | ICD-10-CM | POA: Insufficient documentation

## 2014-11-03 DIAGNOSIS — S80812A Abrasion, left lower leg, initial encounter: Secondary | ICD-10-CM | POA: Diagnosis not present

## 2014-11-03 DIAGNOSIS — Y9389 Activity, other specified: Secondary | ICD-10-CM | POA: Insufficient documentation

## 2014-11-03 DIAGNOSIS — Y92410 Unspecified street and highway as the place of occurrence of the external cause: Secondary | ICD-10-CM | POA: Diagnosis not present

## 2014-11-03 DIAGNOSIS — I10 Essential (primary) hypertension: Secondary | ICD-10-CM | POA: Diagnosis not present

## 2014-11-03 DIAGNOSIS — S80212A Abrasion, left knee, initial encounter: Secondary | ICD-10-CM | POA: Insufficient documentation

## 2014-11-03 DIAGNOSIS — S299XXA Unspecified injury of thorax, initial encounter: Secondary | ICD-10-CM | POA: Insufficient documentation

## 2014-11-03 DIAGNOSIS — Y998 Other external cause status: Secondary | ICD-10-CM | POA: Insufficient documentation

## 2014-11-03 MED ORDER — OXYCODONE-ACETAMINOPHEN 5-325 MG PO TABS
ORAL_TABLET | ORAL | Status: DC
Start: 2014-11-03 — End: 2014-11-03
  Filled 2014-11-03: qty 1

## 2014-11-03 MED ORDER — OXYCODONE-ACETAMINOPHEN 5-325 MG PO TABS
1.0000 | ORAL_TABLET | Freq: Once | ORAL | Status: AC
  Administered 2014-11-03: 1 via ORAL

## 2014-11-03 NOTE — ED Notes (Signed)
Pt sitting in room E44 with son

## 2014-11-03 NOTE — ED Notes (Signed)
Pt states he wants to leave because he has been waiting too long. Informed pt that if he leaves he will be LWBS. Pt states that was fine

## 2014-11-03 NOTE — ED Notes (Signed)
Patient left since son has been discharged.
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
## 2014-11-06 NOTE — Pre-Procedure Instructions (Signed)
JODECI ROARTY  11/06/2014      [facility] - [place] [place] [address] Phone: [phone] Fax: [place] [address] - [place], [place] - [address] [place] [address] Phone: [phone] Fax: [place], [place] [address] [place] [address] Phone: [phone] Fax: [phone]    Your procedure is scheduled on Oct. 4  Report to [place] at 530 A.M.  Call this number if you have problems the morning of surgery:  [phone]   Remember:  Do not eat food or drink liquids after midnight.  Take these medicines the morning of surgery with A SIP OF WATER: ALLOPURINOL, AMLODIPINE (NORVASC), DEXILANT, OXYCODONE IF NEEDED, SERTRALINE (ZOLOFT)  Stop taking aspirin, Ibuprofen, BC'S, Goody's, Aleve, Herbal medications, Fish Oil How to Manage Your Diabetes Before Surgery   Why is it important to control my blood sugar before and after surgery?   Improving blood sugar levels before and after surgery helps healing and can limit problems.  A way of improving blood sugar control is eating a healthy diet by:  - Eating less sugar and carbohydrates  - Increasing activity/exercise  - Talk with your doctor about reaching your blood sugar goals  High blood sugars (greater than 180 mg/dL) can raise your risk of infections and slow down your recovery so you will need to focus on controlling your diabetes during the weeks before surgery.  Make sure that the doctor who takes care of your diabetes knows about your planned surgery including the date and location.  How do I manage my blood sugars before surgery?   Check your blood sugar at least 4 times a day, 2 days before surgery to make sure that they are not too high or low.   Check your  blood sugar the morning of your surgery when you wake up and every 2               hours until you get to the Short-Stay unit.  If your blood sugar is less than 70 mg/dL, you will need to treat for low blood sugar by:  Treat a low blood sugar (less than 70 mg/dL) with 1/2 cup of clear juice (cranberry or apple), 4 glucose tablets, OR glucose gel.  Recheck blood sugar in 15 minutes after treatment (to make sure it is greater than 70 mg/dL).  If blood sugar is not greater than 70 mg/dL on re-check, call [phone] for further instructions.   Report your blood sugar to the Short-Stay nurse when you get to Short-Stay.  References:  [facility], 2007 "How to Manage your Diabetes Before and After Surgery".  What do I do about my diabetes medications?   Do not take oral diabetes medicines (pills) the morning of surgery.    Do not take other diabetes injectables the day of surgery including Byetta, Victoza, Bydureon, and Trulicity.    If your CBG is greater than 220 mg/dL, you may take 1/2 of your sliding scale (correction) dose of insulin.   For patients with "Insulin Pumps":  Contact your diabetes doctor for specific instructions before surgery.   Decrease basal insulin rates by 20% at midnight the night before surgery.  Note that if your surgery is planned to  be longer than 2 hours, your insulin pump will be removed and intravenous (IV) insulin will be started and managed by the nurses and anesthesiologist.  You will be able to restart your insulin pump once you are awake and able to manage it.  Make sure to bring insulin pump supplies to the hospital with you in case your site needs to be changed.        Do not wear jewelry, make-up or nail polish.  Do not wear lotions, powders, or perfumes.  You may wear deodorant.  Do not shave 48 hours prior to surgery.  Men may shave face and neck.  Do not bring valuables to the hospital.  [facility] is not  responsible for any belongings or valuables.  Contacts, dentures or bridgework may not be worn into surgery.  Leave your suitcase in the car.  After surgery it may be brought to your room.  For patients admitted to the hospital, discharge time will be determined by your treatment team.  Patients discharged the day of surgery will not be allowed to drive home.    Special instructions: [place] - Preparing for Surgery  Before surgery, you can play an important role.  Because skin is not sterile, your skin needs to be as free of germs as possible.  You can reduce the number of germs on you skin by washing with CHG (chlorahexidine gluconate) soap before surgery.  CHG is an antiseptic cleaner which kills germs and bonds with the skin to continue killing germs even after washing.  Please DO NOT use if you have an allergy to CHG or antibacterial soaps.  If your skin becomes reddened/irritated stop using the CHG and inform your nurse when you arrive at Short Stay.  Do not shave (including legs and underarms) for at least 48 hours prior to the first CHG shower.  You may shave your face.  Please follow these instructions carefully:   1.  Shower with CHG Soap the night before surgery and the    morning of Surgery.  2.  If you choose to wash your hair, wash your hair first as usual with your  normal shampoo.  3.  After you shampoo, rinse your hair and body thoroughly to remove the   Shampoo.  4.  Use CHG as you would any other liquid soap.  You can apply chg directly  to the skin and wash gently with scrungie or a clean washcloth.  5.  Apply the CHG Soap to your body ONLY FROM THE NECK DOWN.   Do not use on open wounds or open sores.  Avoid contact with your eyes,  ears, mouth and genitals (private parts).  Wash genitals (private parts)   with your normal soap.  6.  Wash thoroughly, paying special attention to the area where your surgery will be performed.  7.  Thoroughly rinse your body with warm  water from the neck down.  8.  DO NOT shower/wash with your normal soap after using and rinsing off the CHG Soap.  9.  Pat yourself dry with a clean towel.            10.  Wear clean pajamas.            11.  Place clean sheets on your bed the night of your first shower and do not sleep with pets.  Day of Surgery  Do not apply any lotions/deoderants the morning of surgery.  Please wear clean clothes to the hospital/surgery  center.     Please read over the following fact sheets that you were given. Pain Booklet, Coughing and Deep Breathing, Blood Transfusion Information, MRSA Information and Surgical Site Infection Prevention

## 2014-11-09 ENCOUNTER — Ambulatory Visit (HOSPITAL_COMMUNITY)
Admission: RE | Admit: 2014-11-09 | Discharge: 2014-11-09 | Disposition: A | Discharge status: Home / Self Care | Payer: Medicare HMO | Source: Ambulatory Visit | Attending: Specialist | Admitting: Specialist

## 2014-11-09 ENCOUNTER — Encounter (HOSPITAL_COMMUNITY): Payer: Self-pay

## 2014-11-09 ENCOUNTER — Encounter (HOSPITAL_COMMUNITY)
Admission: RE | Admit: 2014-11-09 | Discharge: 2014-11-09 | Disposition: A | Discharge status: Home / Self Care | Payer: Medicare HMO | Source: Ambulatory Visit | Attending: Specialist | Admitting: Specialist

## 2014-11-09 DIAGNOSIS — R0602 Shortness of breath: Secondary | ICD-10-CM | POA: Diagnosis not present

## 2014-11-09 DIAGNOSIS — Z01812 Encounter for preprocedural laboratory examination: Secondary | ICD-10-CM | POA: Diagnosis not present

## 2014-11-09 DIAGNOSIS — Z01818 Encounter for other preprocedural examination: Secondary | ICD-10-CM | POA: Diagnosis present

## 2014-11-09 DIAGNOSIS — R05 Cough: Secondary | ICD-10-CM | POA: Insufficient documentation

## 2014-11-09 DIAGNOSIS — Z0183 Encounter for blood typing: Secondary | ICD-10-CM | POA: Insufficient documentation

## 2014-11-09 DIAGNOSIS — R9431 Abnormal electrocardiogram [ECG] [EKG]: Secondary | ICD-10-CM | POA: Insufficient documentation

## 2014-11-09 DIAGNOSIS — R059 Cough, unspecified: Secondary | ICD-10-CM

## 2014-11-09 HISTORY — DX: Cough: R05

## 2014-11-09 HISTORY — DX: Headache: R51

## 2014-11-09 HISTORY — DX: Other specified cough: R05.8

## 2014-11-09 HISTORY — DX: Headache, unspecified: R51.9

## 2014-11-09 HISTORY — DX: Reserved for inherently not codable concepts without codable children: IMO0001

## 2014-11-09 LAB — COMPREHENSIVE METABOLIC PANEL
ALT: 22 U/L (ref 17–63)
AST: 21 U/L (ref 15–41)
Albumin: 3.7 g/dL (ref 3.5–5.0)
Alkaline Phosphatase: 85 U/L (ref 38–126)
Anion gap: 10 (ref 5–15)
BUN: 11 mg/dL (ref 6–20)
CHLORIDE: 99 mmol/L — AB (ref 101–111)
CO2: 27 mmol/L (ref 22–32)
CREATININE: 0.77 mg/dL (ref 0.61–1.24)
Calcium: 9.1 mg/dL (ref 8.9–10.3)
GFR calc Af Amer: >60 mL/min (ref >60)
GFR calc non Af Amer: >60 mL/min (ref >60)
Glucose, Bld: 127 mg/dL — ABNORMAL HIGH (ref 65–99)
POTASSIUM: 4.3 mmol/L (ref 3.5–5.1)
SODIUM: 136 mmol/L (ref 135–145)
Total Bilirubin: 0.5 mg/dL (ref 0.3–1.2)
Total Protein: 7.5 g/dL (ref 6.5–8.1)

## 2014-11-09 LAB — CBC
HCT: 37.3 % — ABNORMAL LOW (ref 39.0–52.0)
Hemoglobin: 11.8 g/dL — ABNORMAL LOW (ref 13.0–17.0)
MCH: 23 pg — ABNORMAL LOW (ref 26.0–34.0)
MCHC: 31.6 g/dL (ref 30.0–36.0)
MCV: 72.9 fL — ABNORMAL LOW (ref 78.0–100.0)
PLATELETS: 235 10*3/uL (ref 150–400)
RBC: 5.12 MIL/uL (ref 4.22–5.81)
RDW: 16.8 % — ABNORMAL HIGH (ref 11.5–15.5)
WBC: 11.1 10*3/uL — AB (ref 4.0–10.5)

## 2014-11-09 LAB — SURGICAL PCR SCREEN
MRSA, PCR: NEGATIVE
STAPHYLOCOCCUS AUREUS: NEGATIVE

## 2014-11-09 LAB — GLUCOSE, CAPILLARY: GLUCOSE-CAPILLARY: 114 mg/dL — AB (ref 65–99)

## 2014-11-10 LAB — HEMOGLOBIN A1C
Hgb A1c MFr Bld: 7.7 % — ABNORMAL HIGH (ref 4.8–5.6)
Mean Plasma Glucose: 174 mg/dL

## 2014-11-16 MED ORDER — DEXTROSE 5 % IV SOLN
3.0000 g | INTRAVENOUS | Status: AC
  Administered 2014-11-17: 3 g via INTRAVENOUS
  Administered 2014-11-17: 2 g via INTRAVENOUS
  Administered 2014-11-17: 3 g via INTRAVENOUS
  Filled 2014-11-16: qty 3000

## 2014-11-17 ENCOUNTER — Inpatient Hospital Stay (HOSPITAL_COMMUNITY): Payer: Medicare HMO

## 2014-11-17 ENCOUNTER — Encounter (HOSPITAL_COMMUNITY)
Admission: RE | Disposition: A | Discharge status: Skilled Nursing Facility | Payer: Medicare HMO | Source: Ambulatory Visit | Attending: Specialist

## 2014-11-17 ENCOUNTER — Inpatient Hospital Stay (HOSPITAL_COMMUNITY): Payer: Medicare HMO | Admitting: Anesthesiology

## 2014-11-17 ENCOUNTER — Inpatient Hospital Stay (HOSPITAL_COMMUNITY)
Admission: RE | Admit: 2014-11-17 | Discharge: 2014-11-24 | DRG: 459 | Disposition: A | Discharge status: Skilled Nursing Facility | Payer: Medicare HMO | Source: Ambulatory Visit | Attending: Specialist | Admitting: Specialist

## 2014-11-17 ENCOUNTER — Encounter (HOSPITAL_COMMUNITY): Payer: Self-pay | Admitting: General Practice

## 2014-11-17 DIAGNOSIS — Z79899 Other long term (current) drug therapy: Secondary | ICD-10-CM | POA: Diagnosis not present

## 2014-11-17 DIAGNOSIS — Z8249 Family history of ischemic heart disease and other diseases of the circulatory system: Secondary | ICD-10-CM | POA: Diagnosis not present

## 2014-11-17 DIAGNOSIS — Z7952 Long term (current) use of systemic steroids: Secondary | ICD-10-CM

## 2014-11-17 DIAGNOSIS — T814XXA Infection following a procedure, initial encounter: Secondary | ICD-10-CM | POA: Diagnosis not present

## 2014-11-17 DIAGNOSIS — Z96653 Presence of artificial knee joint, bilateral: Secondary | ICD-10-CM | POA: Diagnosis present

## 2014-11-17 DIAGNOSIS — I959 Hypotension, unspecified: Secondary | ICD-10-CM | POA: Diagnosis not present

## 2014-11-17 DIAGNOSIS — N170 Acute kidney failure with tubular necrosis: Secondary | ICD-10-CM | POA: Diagnosis not present

## 2014-11-17 DIAGNOSIS — J9602 Acute respiratory failure with hypercapnia: Secondary | ICD-10-CM

## 2014-11-17 DIAGNOSIS — T84498A Other mechanical complication of other internal orthopedic devices, implants and grafts, initial encounter: Secondary | ICD-10-CM

## 2014-11-17 DIAGNOSIS — N171 Acute kidney failure with acute cortical necrosis: Secondary | ICD-10-CM

## 2014-11-17 DIAGNOSIS — L4050 Arthropathic psoriasis, unspecified: Secondary | ICD-10-CM | POA: Diagnosis present

## 2014-11-17 DIAGNOSIS — J80 Acute respiratory distress syndrome: Secondary | ICD-10-CM | POA: Diagnosis not present

## 2014-11-17 DIAGNOSIS — F329 Major depressive disorder, single episode, unspecified: Secondary | ICD-10-CM | POA: Diagnosis present

## 2014-11-17 DIAGNOSIS — Z7984 Long term (current) use of oral hypoglycemic drugs: Secondary | ICD-10-CM | POA: Diagnosis not present

## 2014-11-17 DIAGNOSIS — K219 Gastro-esophageal reflux disease without esophagitis: Secondary | ICD-10-CM | POA: Diagnosis present

## 2014-11-17 DIAGNOSIS — M109 Gout, unspecified: Secondary | ICD-10-CM | POA: Diagnosis present

## 2014-11-17 DIAGNOSIS — I10 Essential (primary) hypertension: Secondary | ICD-10-CM | POA: Diagnosis present

## 2014-11-17 DIAGNOSIS — T84028A Dislocation of other internal joint prosthesis, initial encounter: Secondary | ICD-10-CM | POA: Diagnosis present

## 2014-11-17 DIAGNOSIS — Z452 Encounter for adjustment and management of vascular access device: Secondary | ICD-10-CM

## 2014-11-17 DIAGNOSIS — M1990 Unspecified osteoarthritis, unspecified site: Secondary | ICD-10-CM | POA: Diagnosis present

## 2014-11-17 DIAGNOSIS — F419 Anxiety disorder, unspecified: Secondary | ICD-10-CM | POA: Diagnosis present

## 2014-11-17 DIAGNOSIS — E872 Acidosis: Secondary | ICD-10-CM | POA: Diagnosis not present

## 2014-11-17 DIAGNOSIS — N179 Acute kidney failure, unspecified: Secondary | ICD-10-CM

## 2014-11-17 DIAGNOSIS — J811 Chronic pulmonary edema: Secondary | ICD-10-CM | POA: Diagnosis not present

## 2014-11-17 DIAGNOSIS — J155 Pneumonia due to Escherichia coli: Secondary | ICD-10-CM | POA: Diagnosis not present

## 2014-11-17 DIAGNOSIS — E875 Hyperkalemia: Secondary | ICD-10-CM | POA: Diagnosis not present

## 2014-11-17 DIAGNOSIS — R509 Fever, unspecified: Secondary | ICD-10-CM | POA: Diagnosis not present

## 2014-11-17 DIAGNOSIS — J95821 Acute postprocedural respiratory failure: Secondary | ICD-10-CM | POA: Diagnosis not present

## 2014-11-17 DIAGNOSIS — J9601 Acute respiratory failure with hypoxia: Secondary | ICD-10-CM

## 2014-11-17 DIAGNOSIS — G4730 Sleep apnea, unspecified: Secondary | ICD-10-CM | POA: Diagnosis present

## 2014-11-17 DIAGNOSIS — Z79891 Long term (current) use of opiate analgesic: Secondary | ICD-10-CM | POA: Diagnosis not present

## 2014-11-17 DIAGNOSIS — Y838 Other surgical procedures as the cause of abnormal reaction of the patient, or of later complication, without mention of misadventure at the time of the procedure: Secondary | ICD-10-CM | POA: Diagnosis present

## 2014-11-17 DIAGNOSIS — R0902 Hypoxemia: Secondary | ICD-10-CM

## 2014-11-17 DIAGNOSIS — E1165 Type 2 diabetes mellitus with hyperglycemia: Secondary | ICD-10-CM | POA: Diagnosis present

## 2014-11-17 DIAGNOSIS — E861 Hypovolemia: Secondary | ICD-10-CM | POA: Diagnosis not present

## 2014-11-17 DIAGNOSIS — M5416 Radiculopathy, lumbar region: Secondary | ICD-10-CM | POA: Diagnosis present

## 2014-11-17 DIAGNOSIS — T380X5A Adverse effect of glucocorticoids and synthetic analogues, initial encounter: Secondary | ICD-10-CM | POA: Diagnosis not present

## 2014-11-17 DIAGNOSIS — Z9889 Other specified postprocedural states: Secondary | ICD-10-CM | POA: Diagnosis not present

## 2014-11-17 DIAGNOSIS — M4806 Spinal stenosis, lumbar region: Secondary | ICD-10-CM | POA: Diagnosis present

## 2014-11-17 DIAGNOSIS — J9600 Acute respiratory failure, unspecified whether with hypoxia or hypercapnia: Secondary | ICD-10-CM | POA: Insufficient documentation

## 2014-11-17 DIAGNOSIS — K567 Ileus, unspecified: Secondary | ICD-10-CM | POA: Diagnosis not present

## 2014-11-17 DIAGNOSIS — F32A Depression, unspecified: Secondary | ICD-10-CM | POA: Diagnosis present

## 2014-11-17 DIAGNOSIS — Z6841 Body Mass Index (BMI) 40.0 and over, adult: Secondary | ICD-10-CM

## 2014-11-17 DIAGNOSIS — E119 Type 2 diabetes mellitus without complications: Secondary | ICD-10-CM

## 2014-11-17 DIAGNOSIS — M960 Pseudarthrosis after fusion or arthrodesis: Secondary | ICD-10-CM

## 2014-11-17 DIAGNOSIS — D72829 Elevated white blood cell count, unspecified: Secondary | ICD-10-CM | POA: Diagnosis present

## 2014-11-17 DIAGNOSIS — D62 Acute posthemorrhagic anemia: Secondary | ICD-10-CM | POA: Diagnosis not present

## 2014-11-17 DIAGNOSIS — I9581 Postprocedural hypotension: Secondary | ICD-10-CM | POA: Diagnosis not present

## 2014-11-17 DIAGNOSIS — G894 Chronic pain syndrome: Secondary | ICD-10-CM | POA: Diagnosis not present

## 2014-11-17 DIAGNOSIS — Z9289 Personal history of other medical treatment: Secondary | ICD-10-CM

## 2014-11-17 DIAGNOSIS — J9811 Atelectasis: Secondary | ICD-10-CM

## 2014-11-17 DIAGNOSIS — Z4659 Encounter for fitting and adjustment of other gastrointestinal appliance and device: Secondary | ICD-10-CM

## 2014-11-17 DIAGNOSIS — M25561 Pain in right knee: Secondary | ICD-10-CM

## 2014-11-17 DIAGNOSIS — E118 Type 2 diabetes mellitus with unspecified complications: Secondary | ICD-10-CM | POA: Diagnosis not present

## 2014-11-17 DIAGNOSIS — E876 Hypokalemia: Secondary | ICD-10-CM | POA: Diagnosis not present

## 2014-11-17 DIAGNOSIS — E785 Hyperlipidemia, unspecified: Secondary | ICD-10-CM | POA: Diagnosis present

## 2014-11-17 DIAGNOSIS — R079 Chest pain, unspecified: Secondary | ICD-10-CM | POA: Diagnosis not present

## 2014-11-17 DIAGNOSIS — Z419 Encounter for procedure for purposes other than remedying health state, unspecified: Secondary | ICD-10-CM

## 2014-11-17 DIAGNOSIS — J9620 Acute and chronic respiratory failure, unspecified whether with hypoxia or hypercapnia: Secondary | ICD-10-CM | POA: Diagnosis not present

## 2014-11-17 DIAGNOSIS — A419 Sepsis, unspecified organism: Secondary | ICD-10-CM | POA: Diagnosis not present

## 2014-11-17 LAB — CBC
HEMATOCRIT: 35.5 % — AB (ref 39.0–52.0)
HEMOGLOBIN: 11 g/dL — AB (ref 13.0–17.0)
MCH: 23.2 pg — ABNORMAL LOW (ref 26.0–34.0)
MCHC: 31 g/dL (ref 30.0–36.0)
MCV: 74.9 fL — ABNORMAL LOW (ref 78.0–100.0)
Platelets: 192 10*3/uL (ref 150–400)
RBC: 4.74 MIL/uL (ref 4.22–5.81)
RDW: 16.8 % — AB (ref 11.5–15.5)
WBC: 25 10*3/uL — AB (ref 4.0–10.5)

## 2014-11-17 LAB — TRIGLYCERIDES: TRIGLYCERIDES: 245 mg/dL — AB (ref <150)

## 2014-11-17 LAB — BASIC METABOLIC PANEL
Anion gap: 13 (ref 5–15)
BUN: 13 mg/dL (ref 6–20)
CALCIUM: 7.3 mg/dL — AB (ref 8.9–10.3)
CO2: 23 mmol/L (ref 22–32)
Chloride: 100 mmol/L — ABNORMAL LOW (ref 101–111)
Creatinine, Ser: 1.56 mg/dL — ABNORMAL HIGH (ref 0.61–1.24)
GFR calc Af Amer: 56 mL/min — ABNORMAL LOW (ref >60)
GFR, EST NON AFRICAN AMERICAN: 48 mL/min — AB (ref >60)
Glucose, Bld: 196 mg/dL — ABNORMAL HIGH (ref 65–99)
POTASSIUM: 4.9 mmol/L (ref 3.5–5.1)
SODIUM: 136 mmol/L (ref 135–145)

## 2014-11-17 LAB — TROPONIN I

## 2014-11-17 LAB — POCT I-STAT 7, (LYTES, BLD GAS, ICA,H+H)
Acid-base deficit: 3 mmol/L — ABNORMAL HIGH (ref 0.0–2.0)
Bicarbonate: 26.2 mEq/L — ABNORMAL HIGH (ref 20.0–24.0)
Calcium, Ion: 1.06 mmol/L — ABNORMAL LOW (ref 1.12–1.23)
HCT: 34 % — ABNORMAL LOW (ref 39.0–52.0)
Hemoglobin: 11.6 g/dL — ABNORMAL LOW (ref 13.0–17.0)
O2 Saturation: 94 %
PCO2 ART: 66.3 mmHg — AB (ref 35.0–45.0)
PO2 ART: 86 mmHg (ref 80.0–100.0)
POTASSIUM: 5.6 mmol/L — AB (ref 3.5–5.1)
Sodium: 137 mmol/L (ref 135–145)
TCO2: 28 mmol/L (ref 0–100)
pH, Arterial: 7.204 — ABNORMAL LOW (ref 7.350–7.450)

## 2014-11-17 LAB — POCT I-STAT 3, ART BLOOD GAS (G3+)
ACID-BASE DEFICIT: 5 mmol/L — AB (ref 0.0–2.0)
ACID-BASE DEFICIT: 5 mmol/L — AB (ref 0.0–2.0)
Bicarbonate: 24.3 mEq/L — ABNORMAL HIGH (ref 20.0–24.0)
Bicarbonate: 21.8 mEq/L (ref 20.0–24.0)
O2 SAT: 92 %
O2 SAT: 91 %
PCO2 ART: 63 mmHg — AB (ref 35.0–45.0)
PCO2 ART: 48.4 mmHg — AB (ref 35.0–45.0)
PH ART: 7.195 — AB (ref 7.350–7.450)
PH ART: 7.261 — AB (ref 7.350–7.450)
PO2 ART: 80 mmHg (ref 80.0–100.0)
PO2 ART: 72 mmHg — AB (ref 80.0–100.0)
Patient temperature: 98.7
Patient temperature: 98.7
TCO2: 26 mmol/L (ref 0–100)
TCO2: 23 mmol/L (ref 0–100)

## 2014-11-17 LAB — POCT I-STAT 4, (NA,K, GLUC, HGB,HCT)
GLUCOSE: 171 mg/dL — AB (ref 65–99)
GLUCOSE: 152 mg/dL — AB (ref 65–99)
Glucose, Bld: 157 mg/dL — ABNORMAL HIGH (ref 65–99)
Glucose, Bld: 159 mg/dL — ABNORMAL HIGH (ref 65–99)
Glucose, Bld: 162 mg/dL — ABNORMAL HIGH (ref 65–99)
HCT: 34 % — ABNORMAL LOW (ref 39.0–52.0)
HCT: 36 % — ABNORMAL LOW (ref 39.0–52.0)
HEMATOCRIT: 32 % — AB (ref 39.0–52.0)
HEMATOCRIT: 32 % — AB (ref 39.0–52.0)
HEMATOCRIT: 33 % — AB (ref 39.0–52.0)
HEMOGLOBIN: 10.9 g/dL — AB (ref 13.0–17.0)
HEMOGLOBIN: 11.6 g/dL — AB (ref 13.0–17.0)
HEMOGLOBIN: 11.2 g/dL — AB (ref 13.0–17.0)
Hemoglobin: 10.9 g/dL — ABNORMAL LOW (ref 13.0–17.0)
Hemoglobin: 12.2 g/dL — ABNORMAL LOW (ref 13.0–17.0)
POTASSIUM: 4.6 mmol/L (ref 3.5–5.1)
POTASSIUM: 5.7 mmol/L — AB (ref 3.5–5.1)
Potassium: 4.3 mmol/L (ref 3.5–5.1)
Potassium: 4.3 mmol/L (ref 3.5–5.1)
Potassium: 5.3 mmol/L — ABNORMAL HIGH (ref 3.5–5.1)
SODIUM: 138 mmol/L (ref 135–145)
SODIUM: 138 mmol/L (ref 135–145)
SODIUM: 137 mmol/L (ref 135–145)
Sodium: 137 mmol/L (ref 135–145)
Sodium: 137 mmol/L (ref 135–145)

## 2014-11-17 LAB — GLUCOSE, CAPILLARY
GLUCOSE-CAPILLARY: 151 mg/dL — AB (ref 65–99)
Glucose-Capillary: 165 mg/dL — ABNORMAL HIGH (ref 65–99)

## 2014-11-17 SURGERY — POSTERIOR LUMBAR FUSION 3 WITH HARDWARE REMOVAL
Anesthesia: General | Site: Back

## 2014-11-17 MED ORDER — MONTELUKAST SODIUM 10 MG PO TABS
10.0000 mg | ORAL_TABLET | Freq: Every day | ORAL | Status: DC
  Filled 2014-11-17: qty 1

## 2014-11-17 MED ORDER — ANTISEPTIC ORAL RINSE SOLUTION (CORINZ)
7.0000 mL | OROMUCOSAL | Status: DC
  Administered 2014-11-17 – 2014-11-22 (×41): 7 mL via OROMUCOSAL

## 2014-11-17 MED ORDER — SODIUM CHLORIDE 0.9 % IJ SOLN
INTRAMUSCULAR | Status: AC
  Filled 2014-11-17: qty 20

## 2014-11-17 MED ORDER — CLOBETASOL PROPIONATE 0.05 % EX OINT
1.0000 "application " | TOPICAL_OINTMENT | Freq: Every day | CUTANEOUS | Status: DC | PRN
  Filled 2014-11-17: qty 15

## 2014-11-17 MED ORDER — FUROSEMIDE 10 MG/ML IJ SOLN
40.0000 mg | Freq: Two times a day (BID) | INTRAMUSCULAR | Status: DC
  Administered 2014-11-17: 40 mg via INTRAVENOUS
  Filled 2014-11-17: qty 4

## 2014-11-17 MED ORDER — PROPOFOL 1000 MG/100ML IV EMUL
0.0000 ug/kg/min | INTRAVENOUS | Status: DC
  Administered 2014-11-17: 30 ug/kg/min via INTRAVENOUS
  Administered 2014-11-17 – 2014-11-18 (×2): 40 ug/kg/min via INTRAVENOUS
  Administered 2014-11-18 (×2): 10 ug/kg/min via INTRAVENOUS
  Administered 2014-11-19 (×2): 15 ug/kg/min via INTRAVENOUS
  Filled 2014-11-17 (×7): qty 100

## 2014-11-17 MED ORDER — MIDAZOLAM HCL 2 MG/2ML IJ SOLN
INTRAMUSCULAR | Status: AC
  Filled 2014-11-17: qty 4

## 2014-11-17 MED ORDER — FENTANYL CITRATE (PF) 250 MCG/5ML IJ SOLN
INTRAMUSCULAR | Status: DC | PRN
  Administered 2014-11-17: 100 ug via INTRAVENOUS
  Administered 2014-11-17: 250 ug via INTRAVENOUS
  Administered 2014-11-17 (×2): 50 ug via INTRAVENOUS
  Administered 2014-11-17: 100 ug via INTRAVENOUS
  Administered 2014-11-17 (×2): 50 ug via INTRAVENOUS
  Administered 2014-11-17: 100 ug via INTRAVENOUS
  Administered 2014-11-17 (×10): 50 ug via INTRAVENOUS

## 2014-11-17 MED ORDER — BUPIVACAINE-EPINEPHRINE (PF) 0.5% -1:200000 IJ SOLN
INTRAMUSCULAR | Status: AC
  Filled 2014-11-17: qty 30

## 2014-11-17 MED ORDER — PHENOL 1.4 % MT LIQD: 1.0000 | OROMUCOSAL | Status: DC | PRN

## 2014-11-17 MED ORDER — SODIUM CHLORIDE 0.9 % IV SOLN
INTRAVENOUS | Status: DC
  Administered 2014-11-17 – 2014-11-18 (×2): via INTRAVENOUS

## 2014-11-17 MED ORDER — MANGANESE 50 MG PO TABS: 1.0000 | ORAL_TABLET | Freq: Every evening | ORAL | Status: DC

## 2014-11-17 MED ORDER — BUPIVACAINE LIPOSOME 1.3 % IJ SUSP
20.0000 mL | INTRAMUSCULAR | Status: AC
  Administered 2014-11-17: 20 mL
  Filled 2014-11-17: qty 20

## 2014-11-17 MED ORDER — BUPIVACAINE HCL (PF) 0.5 % IJ SOLN
INTRAMUSCULAR | Status: AC
  Filled 2014-11-17: qty 30

## 2014-11-17 MED ORDER — SUCCINYLCHOLINE CHLORIDE 20 MG/ML IJ SOLN
INTRAMUSCULAR | Status: DC | PRN
  Administered 2014-11-17: 140 mg via INTRAVENOUS

## 2014-11-17 MED ORDER — SERTRALINE HCL 50 MG PO TABS: 25.0000 mg | ORAL_TABLET | Freq: Two times a day (BID) | ORAL | Status: DC

## 2014-11-17 MED ORDER — MORPHINE SULFATE ER 15 MG PO TBCR: 15.0000 mg | EXTENDED_RELEASE_TABLET | Freq: Two times a day (BID) | ORAL | Status: DC

## 2014-11-17 MED ORDER — DOCUSATE SODIUM 100 MG PO CAPS: 100.0000 mg | ORAL_CAPSULE | Freq: Two times a day (BID) | ORAL | Status: DC

## 2014-11-17 MED ORDER — ROCURONIUM BROMIDE 50 MG/5ML IV SOLN
INTRAVENOUS | Status: AC
  Filled 2014-11-17: qty 1

## 2014-11-17 MED ORDER — CEFAZOLIN SODIUM 1-5 GM-% IV SOLN
1.0000 g | Freq: Three times a day (TID) | INTRAVENOUS | Status: AC
  Administered 2014-11-17 – 2014-11-18 (×2): 1 g via INTRAVENOUS
  Filled 2014-11-17 (×2): qty 50

## 2014-11-17 MED ORDER — FLUTICASONE PROPIONATE 50 MCG/ACT NA SUSP
1.0000 | Freq: Every day | NASAL | Status: DC
  Filled 2014-11-17: qty 16

## 2014-11-17 MED ORDER — METHOCARBAMOL 500 MG PO TABS: 500.0000 mg | ORAL_TABLET | Freq: Four times a day (QID) | ORAL | Status: DC | PRN

## 2014-11-17 MED ORDER — ACETAMINOPHEN 650 MG RE SUPP: 650.0000 mg | RECTAL | Status: DC | PRN

## 2014-11-17 MED ORDER — CALCIUM CARBONATE-VITAMIN D 500-200 MG-UNIT PO TABS
1.0000 | ORAL_TABLET | Freq: Three times a day (TID) | ORAL | Status: DC
  Filled 2014-11-17: qty 1

## 2014-11-17 MED ORDER — FENTANYL CITRATE (PF) 250 MCG/5ML IJ SOLN
INTRAMUSCULAR | Status: AC
  Filled 2014-11-17: qty 5

## 2014-11-17 MED ORDER — VITAMIN B-12 1000 MCG PO TABS
1000.0000 ug | ORAL_TABLET | Freq: Every day | ORAL | Status: DC
  Administered 2014-11-19 – 2014-11-24 (×6): 1000 ug via ORAL
  Filled 2014-11-17 (×7): qty 1

## 2014-11-17 MED ORDER — PROPOFOL 10 MG/ML IV BOLUS
INTRAVENOUS | Status: DC | PRN
  Administered 2014-11-17: 200 mg via INTRAVENOUS

## 2014-11-17 MED ORDER — MIDAZOLAM HCL 5 MG/5ML IJ SOLN
INTRAMUSCULAR | Status: DC | PRN
  Administered 2014-11-17 (×2): 2 mg via INTRAVENOUS

## 2014-11-17 MED ORDER — DEXTROSE 5 % IV SOLN
3.0000 g | INTRAVENOUS | Status: DC
  Filled 2014-11-17: qty 3000

## 2014-11-17 MED ORDER — DEXTROSE 50 % IV SOLN
INTRAVENOUS | Status: DC | PRN
  Administered 2014-11-17: 12.5 g via INTRAVENOUS

## 2014-11-17 MED ORDER — GLYCOPYRROLATE 0.2 MG/ML IJ SOLN
INTRAMUSCULAR | Status: AC
  Filled 2014-11-17: qty 1

## 2014-11-17 MED ORDER — SODIUM CHLORIDE 0.9 % IV SOLN
0.0000 ug/h | INTRAVENOUS | Status: DC
  Administered 2014-11-18: 25 ug/h via INTRAVENOUS
  Administered 2014-11-18: 300 ug/h via INTRAVENOUS
  Administered 2014-11-18: 200 ug/h via INTRAVENOUS
  Administered 2014-11-19: 300 ug/h via INTRAVENOUS
  Administered 2014-11-19 (×2): 100 ug/h via INTRAVENOUS
  Administered 2014-11-19: 300 ug/h via INTRAVENOUS
  Administered 2014-11-19: 100 ug/h via INTRAVENOUS
  Filled 2014-11-17 (×7): qty 50

## 2014-11-17 MED ORDER — TIZANIDINE HCL 4 MG PO TABS
4.0000 mg | ORAL_TABLET | Freq: Three times a day (TID) | ORAL | Status: DC
  Administered 2014-11-19 (×3): 4 mg via ORAL
  Filled 2014-11-17 (×7): qty 1

## 2014-11-17 MED ORDER — THROMBIN 20000 UNITS EX SOLR
OROMUCOSAL | Status: DC | PRN
  Administered 2014-11-17: 09:00:00 via TOPICAL

## 2014-11-17 MED ORDER — VANCOMYCIN HCL 1000 MG IV SOLR
INTRAVENOUS | Status: DC | PRN
  Administered 2014-11-17: 1000 mg via TOPICAL

## 2014-11-17 MED ORDER — FENTANYL CITRATE (PF) 100 MCG/2ML IJ SOLN
100.0000 ug | INTRAMUSCULAR | Status: DC | PRN
Start: 2014-11-17 — End: 2014-11-21
  Administered 2014-11-17 – 2014-11-20 (×4): 100 ug via INTRAVENOUS
  Filled 2014-11-17: qty 2

## 2014-11-17 MED ORDER — VITAMIN C 500 MG PO TABS
500.0000 mg | ORAL_TABLET | Freq: Every day | ORAL | Status: DC
  Administered 2014-11-19 – 2014-11-24 (×6): 500 mg via ORAL
  Filled 2014-11-17 (×6): qty 1

## 2014-11-17 MED ORDER — VANCOMYCIN HCL 1000 MG IV SOLR
1000.0000 mg | INTRAVENOUS | Status: DC
  Filled 2014-11-17: qty 1000

## 2014-11-17 MED ORDER — INSULIN ASPART 100 UNIT/ML ~~LOC~~ SOLN
SUBCUTANEOUS | Status: AC
  Filled 2014-11-17: qty 10

## 2014-11-17 MED ORDER — ALBUMIN HUMAN 5 % IV SOLN
INTRAVENOUS | Status: DC | PRN
  Administered 2014-11-17 (×2): via INTRAVENOUS

## 2014-11-17 MED ORDER — INSULIN ASPART 100 UNIT/ML ~~LOC~~ SOLN
0.0000 [IU] | SUBCUTANEOUS | Status: DC
  Administered 2014-11-17 – 2014-11-18 (×2): 4 [IU] via SUBCUTANEOUS
  Administered 2014-11-18 (×3): 3 [IU] via SUBCUTANEOUS
  Administered 2014-11-18 – 2014-11-19 (×4): 4 [IU] via SUBCUTANEOUS
  Administered 2014-11-19 (×2): 3 [IU] via SUBCUTANEOUS
  Administered 2014-11-19 – 2014-11-20 (×4): 4 [IU] via SUBCUTANEOUS
  Administered 2014-11-20: 7 [IU] via SUBCUTANEOUS
  Administered 2014-11-20 (×2): 4 [IU] via SUBCUTANEOUS
  Administered 2014-11-21: 3 [IU] via SUBCUTANEOUS
  Administered 2014-11-21 (×3): 4 [IU] via SUBCUTANEOUS
  Administered 2014-11-21: 3 [IU] via SUBCUTANEOUS
  Administered 2014-11-21 – 2014-11-22 (×3): 4 [IU] via SUBCUTANEOUS

## 2014-11-17 MED ORDER — PANTOPRAZOLE SODIUM 40 MG PO TBEC: 40.0000 mg | DELAYED_RELEASE_TABLET | Freq: Every day | ORAL | Status: DC

## 2014-11-17 MED ORDER — ACETAMINOPHEN 325 MG PO TABS: 650.0000 mg | ORAL_TABLET | ORAL | Status: DC | PRN

## 2014-11-17 MED ORDER — EPHEDRINE SULFATE 50 MG/ML IJ SOLN
INTRAMUSCULAR | Status: DC | PRN
  Administered 2014-11-17 (×2): 5 mg via INTRAVENOUS

## 2014-11-17 MED ORDER — BUPIVACAINE HCL 0.5 % IJ SOLN
INTRAMUSCULAR | Status: DC | PRN
  Administered 2014-11-17: 20 mL

## 2014-11-17 MED ORDER — CHLORHEXIDINE GLUCONATE 4 % EX LIQD: 60.0000 mL | Freq: Once | CUTANEOUS | Status: DC

## 2014-11-17 MED ORDER — HYDROMORPHONE HCL 1 MG/ML IJ SOLN
INTRAMUSCULAR | Status: AC
  Filled 2014-11-17: qty 1

## 2014-11-17 MED ORDER — FLEET ENEMA 7-19 GM/118ML RE ENEM: 1.0000 | ENEMA | Freq: Once | RECTAL | Status: DC | PRN

## 2014-11-17 MED ORDER — MAGNESIUM OXIDE 400 (241.3 MG) MG PO TABS
1000.0000 mg | ORAL_TABLET | Freq: Three times a day (TID) | ORAL | Status: DC
  Filled 2014-11-17: qty 2.5

## 2014-11-17 MED ORDER — MENTHOL 3 MG MT LOZG: 1.0000 | LOZENGE | OROMUCOSAL | Status: DC | PRN

## 2014-11-17 MED ORDER — POLYETHYLENE GLYCOL 3350 17 G PO PACK: 17.0000 g | PACK | Freq: Every day | ORAL | Status: DC | PRN

## 2014-11-17 MED ORDER — INSULIN ASPART 100 UNIT/ML ~~LOC~~ SOLN
SUBCUTANEOUS | Status: DC | PRN
  Administered 2014-11-17: 10 [IU] via INTRAVENOUS

## 2014-11-17 MED ORDER — CHLORHEXIDINE GLUCONATE 0.12% ORAL RINSE (MEDLINE KIT)
15.0000 mL | Freq: Two times a day (BID) | OROMUCOSAL | Status: DC
  Administered 2014-11-17 – 2014-11-21 (×9): 15 mL via OROMUCOSAL

## 2014-11-17 MED ORDER — 0.9 % SODIUM CHLORIDE (POUR BTL) OPTIME
TOPICAL | Status: DC | PRN
  Administered 2014-11-17: 550 mL

## 2014-11-17 MED ORDER — ALLOPURINOL 100 MG PO TABS
100.0000 mg | ORAL_TABLET | Freq: Two times a day (BID) | ORAL | Status: DC
  Filled 2014-11-17: qty 1

## 2014-11-17 MED ORDER — LOSARTAN POTASSIUM 50 MG PO TABS: 50.0000 mg | ORAL_TABLET | Freq: Every morning | ORAL | Status: DC

## 2014-11-17 MED ORDER — OXYCODONE HCL 5 MG PO TABS: 30.0000 mg | ORAL_TABLET | Freq: Every day | ORAL | Status: DC

## 2014-11-17 MED ORDER — CALCIUM CHLORIDE 10 % IV SOLN
INTRAVENOUS | Status: DC | PRN
  Administered 2014-11-17: 1 g via INTRAVENOUS

## 2014-11-17 MED ORDER — BUPIVACAINE HCL (PF) 0.25 % IJ SOLN
INTRAMUSCULAR | Status: AC
  Filled 2014-11-17: qty 30

## 2014-11-17 MED ORDER — SODIUM CHLORIDE 0.9 % IV SOLN
INTRAVENOUS | Status: DC | PRN
  Administered 2014-11-17 (×2): via INTRAVENOUS

## 2014-11-17 MED ORDER — AQUAPHOR EX OINT
1.0000 "application " | TOPICAL_OINTMENT | Freq: Every day | CUTANEOUS | Status: DC | PRN
  Filled 2014-11-17: qty 50

## 2014-11-17 MED ORDER — SODIUM CHLORIDE 0.9 % IJ SOLN
3.0000 mL | Freq: Two times a day (BID) | INTRAMUSCULAR | Status: DC
  Administered 2014-11-17: 3 mL via INTRAVENOUS

## 2014-11-17 MED ORDER — PANTOPRAZOLE SODIUM 40 MG IV SOLR
40.0000 mg | Freq: Every day | INTRAVENOUS | Status: DC
  Administered 2014-11-17: 40 mg via INTRAVENOUS
  Filled 2014-11-17: qty 40

## 2014-11-17 MED ORDER — AZELASTINE-FLUTICASONE 137-50 MCG/ACT NA SUSP: 1.0000 | Freq: Every day | NASAL | Status: DC

## 2014-11-17 MED ORDER — ONDANSETRON HCL 4 MG/2ML IJ SOLN: 4.0000 mg | INTRAMUSCULAR | Status: DC | PRN

## 2014-11-17 MED ORDER — SODIUM CHLORIDE 0.9 % IV SOLN
1000.0000 mg | INTRAVENOUS | Status: DC
  Filled 2014-11-17: qty 10

## 2014-11-17 MED ORDER — FENTANYL CITRATE (PF) 100 MCG/2ML IJ SOLN
100.0000 ug | INTRAMUSCULAR | Status: DC | PRN
  Filled 2014-11-17: qty 2

## 2014-11-17 MED ORDER — PHENYLEPHRINE HCL 10 MG/ML IJ SOLN
INTRAMUSCULAR | Status: DC | PRN
  Administered 2014-11-17 (×2): 80 ug via INTRAVENOUS
  Administered 2014-11-17: 40 ug via INTRAVENOUS

## 2014-11-17 MED ORDER — HYDROXYZINE HCL 25 MG PO TABS: 100.0000 mg | ORAL_TABLET | Freq: Three times a day (TID) | ORAL | Status: DC | PRN

## 2014-11-17 MED ORDER — ESMOLOL HCL 10 MG/ML IV SOLN
INTRAVENOUS | Status: DC | PRN
  Administered 2014-11-17 (×4): 20 mg via INTRAVENOUS

## 2014-11-17 MED ORDER — FUROSEMIDE 10 MG/ML IJ SOLN
INTRAMUSCULAR | Status: DC | PRN
  Administered 2014-11-17: 10 mg via INTRAMUSCULAR

## 2014-11-17 MED ORDER — ZOLPIDEM TARTRATE 5 MG PO TABS: 10.0000 mg | ORAL_TABLET | Freq: Every evening | ORAL | Status: DC | PRN

## 2014-11-17 MED ORDER — MAGNESIUM 500 MG PO TABS: 1000.0000 mg | ORAL_TABLET | Freq: Three times a day (TID) | ORAL | Status: DC

## 2014-11-17 MED ORDER — 0.9 % SODIUM CHLORIDE (POUR BTL) OPTIME
TOPICAL | Status: DC | PRN
  Administered 2014-11-17: 1000 mL

## 2014-11-17 MED ORDER — BUPIVACAINE-EPINEPHRINE (PF) 0.25% -1:200000 IJ SOLN
INTRAMUSCULAR | Status: AC
  Filled 2014-11-17: qty 30

## 2014-11-17 MED ORDER — ROCURONIUM BROMIDE 100 MG/10ML IV SOLN
INTRAVENOUS | Status: DC | PRN
  Administered 2014-11-17 (×4): 10 mg via INTRAVENOUS
  Administered 2014-11-17: 20 mg via INTRAVENOUS
  Administered 2014-11-17 (×2): 10 mg via INTRAVENOUS
  Administered 2014-11-17: 50 mg via INTRAVENOUS
  Administered 2014-11-17: 30 mg via INTRAVENOUS
  Administered 2014-11-17: 20 mg via INTRAVENOUS
  Administered 2014-11-17: 25 mg via INTRAVENOUS
  Administered 2014-11-17 (×2): 10 mg via INTRAVENOUS
  Administered 2014-11-17: 25 mg via INTRAVENOUS
  Administered 2014-11-17 (×2): 10 mg via INTRAVENOUS
  Administered 2014-11-17: 50 mg via INTRAVENOUS
  Administered 2014-11-17 (×2): 10 mg via INTRAVENOUS

## 2014-11-17 MED ORDER — VECURONIUM BROMIDE 10 MG IV SOLR
INTRAVENOUS | Status: DC | PRN
  Administered 2014-11-17: 5 mg via INTRAVENOUS

## 2014-11-17 MED ORDER — ALUM & MAG HYDROXIDE-SIMETH 200-200-20 MG/5ML PO SUSP: 30.0000 mL | Freq: Four times a day (QID) | ORAL | Status: DC | PRN

## 2014-11-17 MED ORDER — LIDOCAINE HCL (CARDIAC) 20 MG/ML IV SOLN
INTRAVENOUS | Status: AC
  Filled 2014-11-17: qty 5

## 2014-11-17 MED ORDER — CALCIUM CHLORIDE 10 % IV SOLN
INTRAVENOUS | Status: AC
  Filled 2014-11-17: qty 10

## 2014-11-17 MED ORDER — HYDROMORPHONE HCL 1 MG/ML IJ SOLN
INTRAMUSCULAR | Status: DC | PRN
  Administered 2014-11-17: 1 mg via INTRAVENOUS

## 2014-11-17 MED ORDER — DEXTROSE 50 % IV SOLN
INTRAVENOUS | Status: AC
  Filled 2014-11-17: qty 50

## 2014-11-17 MED ORDER — LACTATED RINGERS IV SOLN
INTRAVENOUS | Status: DC | PRN
  Administered 2014-11-17 (×6): via INTRAVENOUS

## 2014-11-17 MED ORDER — AZELASTINE HCL 0.1 % NA SOLN
1.0000 | Freq: Every day | NASAL | Status: DC
  Filled 2014-11-17: qty 30

## 2014-11-17 MED ORDER — METHOCARBAMOL 1000 MG/10ML IJ SOLN
500.0000 mg | Freq: Four times a day (QID) | INTRAVENOUS | Status: DC | PRN
  Filled 2014-11-17: qty 5

## 2014-11-17 MED ORDER — GLYCOPYRROLATE 0.2 MG/ML IJ SOLN
INTRAMUSCULAR | Status: DC | PRN
  Administered 2014-11-17 (×2): 0.2 mg via INTRAVENOUS

## 2014-11-17 MED ORDER — OXYCODONE-ACETAMINOPHEN 5-325 MG PO TABS: 1.0000 | ORAL_TABLET | ORAL | Status: DC | PRN

## 2014-11-17 MED ORDER — PHENYLEPHRINE HCL 10 MG/ML IJ SOLN
10.0000 mg | INTRAVENOUS | Status: DC | PRN
  Administered 2014-11-17: 10 ug/min via INTRAVENOUS

## 2014-11-17 MED ORDER — CALCIUM CITRATE-VITAMIN D 315-200 MG-UNIT PO TABS
1.0000 | ORAL_TABLET | Freq: Three times a day (TID) | ORAL | Status: DC
Start: 2014-11-18 — End: 2014-11-17

## 2014-11-17 MED ORDER — BISACODYL 5 MG PO TBEC: 5.0000 mg | DELAYED_RELEASE_TABLET | Freq: Every day | ORAL | Status: DC | PRN

## 2014-11-17 MED ORDER — POTASSIUM CHLORIDE IN NACL 20-0.9 MEQ/L-% IV SOLN
INTRAVENOUS | Status: DC
  Filled 2014-11-17 (×2): qty 1000

## 2014-11-17 MED ORDER — AMLODIPINE BESYLATE 10 MG PO TABS: 10.0000 mg | ORAL_TABLET | Freq: Every morning | ORAL | Status: DC

## 2014-11-17 MED ORDER — FUROSEMIDE 10 MG/ML IJ SOLN
INTRAMUSCULAR | Status: AC
  Filled 2014-11-17: qty 2

## 2014-11-17 MED ORDER — HYDROMORPHONE HCL 1 MG/ML IJ SOLN: 0.5000 mg | INTRAMUSCULAR | Status: DC | PRN

## 2014-11-17 MED ORDER — PROPOFOL 10 MG/ML IV BOLUS
INTRAVENOUS | Status: AC
  Filled 2014-11-17: qty 20

## 2014-11-17 MED ORDER — THROMBIN 20000 UNITS EX SOLR
CUTANEOUS | Status: AC
  Filled 2014-11-17: qty 20000

## 2014-11-17 MED ORDER — EPHEDRINE SULFATE 50 MG/ML IJ SOLN
INTRAMUSCULAR | Status: AC
  Filled 2014-11-17: qty 1

## 2014-11-17 MED ORDER — LORATADINE 10 MG PO TABS: 10.0000 mg | ORAL_TABLET | Freq: Every day | ORAL | Status: DC

## 2014-11-17 MED ORDER — SODIUM CHLORIDE 0.9 % IJ SOLN
3.0000 mL | INTRAMUSCULAR | Status: DC | PRN
Start: 2014-11-17 — End: 2014-11-18

## 2014-11-17 MED ORDER — ARTIFICIAL TEARS OP OINT
TOPICAL_OINTMENT | OPHTHALMIC | Status: AC
  Filled 2014-11-17: qty 3.5

## 2014-11-17 MED ORDER — VITAMIN B-12 100 MCG PO TABS: 1000000.0000 ug | ORAL_TABLET | Freq: Every day | ORAL | Status: DC

## 2014-11-17 MED ORDER — SODIUM CHLORIDE 0.9 % IV SOLN: 250.0000 mL | INTRAVENOUS | Status: DC

## 2014-11-17 SURGICAL SUPPLY — 81 items
BAG DECANTER FOR FLEXI CONT (MISCELLANEOUS) ×3 IMPLANT
BLADE SURG ROTATE 9660 (MISCELLANEOUS) ×3 IMPLANT
BONE MATRIX VIVIGEN 1CC (Bone Implant) ×9 IMPLANT
BUR RND FLUTED 2.5 (BURR) IMPLANT
BUR SABER RD CUTTING 3.0 (BURR) ×2 IMPLANT
BUR SABER RD CUTTING 3.0MM (BURR) ×1
CAGE CONCORDE BULLET 9X11X23 (Cage) ×2 IMPLANT
CAGE CONCORDE BULLET 9X11X27 (Cage) ×1 IMPLANT
CAGE CONCORDE BULLET 9X11X27MM (Cage) ×1 IMPLANT
CAGE SPNL 5D BLT NOSE 23X9X11 (Cage) ×1 IMPLANT
CAGE SPNL 5D BLT NOSE 27X9X11 (Cage) ×1 IMPLANT
CAGE TPAL 12MMX10MMX28MM (Cage) ×1 IMPLANT
CAGE TPAL 12X10X28 (Cage) ×2 IMPLANT
CATH SUCT ARGYLE CHIMNEY 10FR (CATHETERS) ×1 IMPLANT
COVER MAYO STAND STRL (DRAPES) ×6 IMPLANT
COVER SURGICAL LIGHT HANDLE (MISCELLANEOUS) ×3 IMPLANT
DERMABOND ADVANCED (GAUZE/BANDAGES/DRESSINGS) ×2
DERMABOND ADVANCED .7 DNX12 (GAUZE/BANDAGES/DRESSINGS) ×1 IMPLANT
DRAPE C-ARM 42X72 X-RAY (DRAPES) ×6 IMPLANT
DRAPE C-ARMOR (DRAPES) ×3 IMPLANT
DRAPE SURG 17X23 STRL (DRAPES) ×9 IMPLANT
DRAPE TABLE COVER HEAVY DUTY (DRAPES) ×3 IMPLANT
DRSG MEPILEX BORDER 4X12 (GAUZE/BANDAGES/DRESSINGS) ×3 IMPLANT
DRSG MEPILEX BORDER 4X4 (GAUZE/BANDAGES/DRESSINGS) ×3 IMPLANT
DRSG MEPILEX BORDER 4X8 (GAUZE/BANDAGES/DRESSINGS) ×3 IMPLANT
DURAPREP 26ML APPLICATOR (WOUND CARE) ×3 IMPLANT
ELECT BLADE 6.5 EXT (BLADE) ×3 IMPLANT
ELECT CAUTERY BLADE 6.4 (BLADE) ×3 IMPLANT
ELECT REM PT RETURN 9FT ADLT (ELECTROSURGICAL) ×3
ELECTRODE REM PT RTRN 9FT ADLT (ELECTROSURGICAL) ×1 IMPLANT
EVACUATOR 1/8 PVC DRAIN (DRAIN) IMPLANT
FEE INTRAOP MONITOR IMPULS NCS (MISCELLANEOUS) ×1 IMPLANT
GLOVE BIOGEL PI IND STRL 8 (GLOVE) ×1 IMPLANT
GLOVE BIOGEL PI INDICATOR 8 (GLOVE) ×2
GLOVE ECLIPSE 9.0 STRL (GLOVE) ×3 IMPLANT
GLOVE ORTHO TXT STRL SZ7.5 (GLOVE) ×3 IMPLANT
GLOVE SURG 8.5 LATEX PF (GLOVE) ×3 IMPLANT
GOWN STRL REUS W/ TWL LRG LVL3 (GOWN DISPOSABLE) ×1 IMPLANT
GOWN STRL REUS W/TWL 2XL LVL3 (GOWN DISPOSABLE) ×6 IMPLANT
GOWN STRL REUS W/TWL LRG LVL3 (GOWN DISPOSABLE) ×2
HEMOSTAT SURGICEL 2X14 (HEMOSTASIS) IMPLANT
INTRAOP MONITOR FEE IMPULS NCS (MISCELLANEOUS) ×1
INTRAOP MONITOR FEE IMPULSE (MISCELLANEOUS) ×2
KIT BASIN OR (CUSTOM PROCEDURE TRAY) ×3 IMPLANT
KIT POSITION SURG JACKSON T1 (MISCELLANEOUS) ×3 IMPLANT
KIT ROOM TURNOVER OR (KITS) ×3 IMPLANT
KIT SUCTION CATH 10FR (CATHETERS) ×2
NEEDLE 22X1 1/2 (OR ONLY) (NEEDLE) ×3 IMPLANT
NEEDLE ASP BONE MRW 11GX15 (NEEDLE) IMPLANT
NEEDLE BONE MARROW 8GAX6 (NEEDLE) IMPLANT
NEEDLE JAMSHIDI VIPER (NEEDLE) ×6 IMPLANT
NS IRRIG 1000ML POUR BTL (IV SOLUTION) ×3 IMPLANT
PACK LAMINECTOMY ORTHO (CUSTOM PROCEDURE TRAY) ×3 IMPLANT
PAD ARMBOARD 7.5X6 YLW CONV (MISCELLANEOUS) ×6 IMPLANT
PATTIES SURGICAL .75X.75 (GAUZE/BANDAGES/DRESSINGS) ×6 IMPLANT
PROBE STIM MONOPOLAR BALL TIP (MISCELLANEOUS) ×3 IMPLANT
ROD EXPEDIUM 6.35 300MM (Rod) ×6 IMPLANT
SCREW EXPEDIUM 8X90MM 6.35 (Screw) ×6 IMPLANT
SCREW POLY EXPEDIUM 6.35 8X40 (Screw) ×3 IMPLANT
SCREW SET 6.35 EXPEDIUM (Screw) ×30 IMPLANT
SPONGE LAP 18X18 X RAY DECT (DISPOSABLE) ×3 IMPLANT
SPONGE LAP 4X18 X RAY DECT (DISPOSABLE) ×3 IMPLANT
STAPLER VISISTAT 35W (STAPLE) ×3 IMPLANT
SUT VIC AB 0 CT1 27 (SUTURE) ×6
SUT VIC AB 0 CT1 27XBRD ANBCTR (SUTURE) ×3 IMPLANT
SUT VIC AB 0 CTX 36 (SUTURE) ×4
SUT VIC AB 0 CTX36XBRD ANTBCTR (SUTURE) ×2 IMPLANT
SUT VIC AB 1 CTX 27 (SUTURE) ×9 IMPLANT
SUT VIC AB 1 CTX 36 (SUTURE) ×4
SUT VIC AB 1 CTX36XBRD ANBCTR (SUTURE) ×2 IMPLANT
SUT VIC AB 2-0 CT1 27 (SUTURE) ×4
SUT VIC AB 2-0 CT1 TAPERPNT 27 (SUTURE) ×2 IMPLANT
SUT VIC AB 3-0 X1 27 (SUTURE) ×3 IMPLANT
SUT VICRYL 0 CT 1 36IN (SUTURE) ×6 IMPLANT
SYR 20CC LL (SYRINGE) ×3 IMPLANT
SYR CONTROL 10ML LL (SYRINGE) ×12 IMPLANT
TOWEL OR 17X24 6PK STRL BLUE (TOWEL DISPOSABLE) ×3 IMPLANT
TOWEL OR 17X26 10 PK STRL BLUE (TOWEL DISPOSABLE) ×3 IMPLANT
TRAY FOLEY CATH 16FRSI W/METER (SET/KITS/TRAYS/PACK) ×3 IMPLANT
WATER STERILE IRR 1000ML POUR (IV SOLUTION) ×3 IMPLANT
YANKAUER SUCT BULB TIP NO VENT (SUCTIONS) ×3 IMPLANT

## 2014-11-17 NOTE — Interval H&P Note (Signed)
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 After consideration of risks, benefits and other options for treatment, the patient has consented to  Procedure(s): Redo transforaminal  lumbar interbody fusions L3-4, L4-5, L5-S1, Revision of hardware L3 to S1, L3 to S1 Posterolateral fusion, Iliac wing fixation screws, L5-S1 Cage, Viviven, cancellous bone chips, rods and screws (N/A) as a surgical intervention .Discussed with patient finding of moderate to severe spinal stenosis at the L2-3 level, agrees with undergoing bilateral L2-3 decompressive laminectomy.  The patient's history has been reviewed, patient examined, no change in status, stable for surgery.  I have reviewed the patient's chart and labs.  Questions were answered to the patient's satisfaction.     [name]

## 2014-11-17 NOTE — Anesthesia Postprocedure Evaluation (Signed)
  Anesthesia Post-op Note  Patient: Phillip Rocha  Procedure(s) Performed: Procedure(s): Redo transforaminal  lumbar interbody fusions L3-4, L4-5, L5-S1, Revision of hardware L3 to S1, L3 to S1 Posterolateral fusion, Iliac wing fixation screws, L5-S1 Cage, Viviven, cancellous bone chips, rods and screws (N/A)  Patient Location: ICU  Anesthesia Type:General  Level of Consciousness: sedated and unresponsive  Airway and Oxygen Therapy: Patient remains intubated and on ventilator  Post-op Pain: none  Post-op Assessment: Post-op Vital signs reviewed, Patient's Cardiovascular Status Stable and Respiratory Function Stable  Post-op Vital Signs: Reviewed  Filed Vitals:   11/17/14 0622  BP: 139/83  Pulse: 83  Temp: 36.9 C  Resp: 18    Complications: No apparent anesthesia complications

## 2014-11-17 NOTE — Progress Notes (Signed)
Pt BP getting soft on propofol drip and prn fentanyl. Last VS 88/58 MAP 65. Notified MD. Orders given for fentanyl gtt.

## 2014-11-17 NOTE — Progress Notes (Signed)
eLink Physician-Brief Progress Note Patient Name: Phillip Rocha DOB: 11-24-1960 MRN: [id number]   Date of Service  11/17/2014  HPI/Events of Note  S/p lumbar revision, long case, EBL 3800, cell saver 1770, K was 5.6 per RN, arrived vented camera in  D/w ortho  eICU Interventions  Stat abg, pcxr for ett Dc K in fluids Add fent for pain Add low dose prop, BP wnl Get K , bmet, cbc now      Intervention Category Evaluation Type: New Patient Evaluation  Raylene Miyamoto. 11/17/2014, 8:42 PM

## 2014-11-17 NOTE — Anesthesia Preprocedure Evaluation (Addendum)
Anesthesia Evaluation  Patient identified by MRN, date of birth, ID band Patient awake    Reviewed: Allergy & Precautions, H&P , NPO status , Patient's Chart, lab work & pertinent test results  Airway Mallampati: III  TM Distance: >3 FB Neck ROM: Full    Dental  (+) Teeth Intact, Dental Advisory Given   Pulmonary shortness of breath and with exertion, sleep apnea ,    Pulmonary exam normal        Cardiovascular hypertension, Pt. on medications Normal cardiovascular exam  Echo 2016:Study Conclusions  - Left ventricle: The cavity size was normal. Wall thickness was normal. Systolic function was normal. The estimated ejection fraction was in the range of 60% to 65%. Wall motion was normal; there were no regional wall motion abnormalities. Doppler parameters are consistent with abnormal left ventricular relaxation (grade 1 diastolic dysfunction). Nuclear stress test 09/26/12: Results showed inferior defect consistent with diaphragmatic attenuation, small fixed mild anterior defect likely soft tissue attenuation, no ischemia, post stress EF 50%   Neuro/Psych PSYCHIATRIC DISORDERS Anxiety Depression negative neurological ROS     GI/Hepatic GERD  Medicated and Controlled,  Endo/Other  diabetes, Type 2, Oral Hypoglycemic AgentsMorbid obesityGlu 137  Renal/GU Renal diseaseHx of ARF     Musculoskeletal   Abdominal (+) + obese,   Peds  Hematology   Anesthesia Other Findings   Reproductive/Obstetrics                           Anesthesia Physical  Anesthesia Plan  ASA: III  Anesthesia Plan: General   Post-op Pain Management:    Induction: Intravenous and Rapid sequence  Airway Management Planned: Oral ETT  Additional Equipment:   Intra-op Plan:   Post-operative Plan: Extubation in OR  Informed Consent: I have reviewed the patients History and Physical, chart, labs and discussed  the procedure including the risks, benefits and alternatives for the proposed anesthesia with the patient or authorized representative who has indicated his/her understanding and acceptance.   Dental advisory given  Plan Discussed with: CRNA, Anesthesiologist and Surgeon  Anesthesia Plan Comments: ( )       Anesthesia Quick Evaluation

## 2014-11-17 NOTE — Transfer of Care (Signed)
Immediate Anesthesia Transfer of Care Note  Patient: Phillip Rocha  Procedure(s) Performed: Procedure(s): Redo transforaminal  lumbar interbody fusions L3-4, L4-5, L5-S1, Revision of hardware L3 to S1, L3 to S1 Posterolateral fusion, Iliac wing fixation screws, L5-S1 Cage, Viviven, cancellous bone chips, rods and screws (N/A)  Patient Location: NICU  Anesthesia Type:General  Level of Consciousness: sedated and Patient remains intubated per anesthesia plan  Airway & Oxygen Therapy: Patient remains intubated per anesthesia plan and Patient placed on Ventilator (see vital sign flow sheet for setting)  Post-op Assessment: Report given to RN  Post vital signs: Reviewed and stable  Last Vitals:  Filed Vitals:   11/17/14 0622  BP: 139/83  Pulse: 83  Temp: 36.9 C  Resp: 18    Complications: No apparent anesthesia complications

## 2014-11-17 NOTE — Progress Notes (Signed)
Patient presents with wound on left thumb and requests that it be dressed.  Patient's left thumb dressed with occlusive dressing.

## 2014-11-17 NOTE — Op Note (Addendum)
11/17/2014  8:08 PM  PATIENT:  Phillip Rocha  54 y.o. 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   PROCEDURE:  Procedure(s): Redo transforaminal  lumbar interbody fusions L3-4, L4-5, L5-S1, Revision of hardware L3 to S1, L3 to S1 Posterolateral fusion, Iliac wing fixation screws, L5-S1 Cage, Viviven, cancellous bone chips, rods and screws revised left S1 pedicle screw, L2-3 bilateral decompressive laminectomies. Explore left L5-S1 neuroforamen with decompressing and replacement of the retropulsed cage into the left L5-S1 disc space.    SURGEON:  Jessy Oto, MD     ASSISTANT:Kurt Hoffmeier Ricard Dillon, PA-C  (Present throughout the entire procedure and necessary for completion of procedure in a timely manner)     ANESTHESIA:  General,supplemented with local anesthetic exparel 1.3% total 20CC, Dr. Massegee/ Dr. Leslye Peer. Kalman Shan.    COMPLICATIONS:  None.     COMPONENTS:   Implant Name Type Inv. Item Serial No. Manufacturer Lot No. LRB No. Used  expiedium 6.35 8x90    DEPUY SPINE  N/A 2  expedium 6.35 set screw    DEPUY SPINE  N/A 10  SCREW POLY EXPEDIUM 6.35 8X40 - [id number] Screw SCREW POLY EXPEDIUM 6.35 8X40  DEPUY SPINE  N/A 1  BONE MATRIX VIVIGEN 1CC - [id number] Bone Implant BONE MATRIX VIVIGEN 1CC [id number] [facility]  N/A 1  BONE MATRIX VIVIGEN 1CC - [id number] Bone Implant BONE MATRIX VIVIGEN 1CC [id number] [facility]  N/A 1  BONE MATRIX VIVIGEN 1CC - [id number] Bone Implant BONE MATRIX VIVIGEN 1CC [id number] [facility]   N/A 1  ROD EXPEDIUM 6.35 300MM - [id number] Rod ROD EXPEDIUM 6.35 300MM  DEPUY SPINE  N/A 2  CAGE TPAL 12MM - [id number] Cage CAGE TPAL 12MM  SYNTHES SPINE  N/A 1  CAGE CONCORDE BULLET [id number] - [id number] Cage CAGE CONCORDE BULLET [id number]  DEPUY SPINE  N/A 1  CAGE CONCORDE BULLET 9X11X27MM - [id number] Cage CAGE CONCORDE BULLET 9X11X27MM   DEPUY SPINE   N/A 1    PROCEDURE:The patient was met in the holding area, and the appropriate lumbar level Central L2-3 and Right L3-4,L4-5 and L5-S1 levels identified and marked with an x and my initials.The patient was then transported to OR. The patient was then placed under general anesthesia without difficulty.The patient received appropriate preoperative antibiotic prophylaxis 3 grams ancef. Nursing staff inserted a Foley catheter under sterile conditions. He was then turned to a prone position Norris spine table was used for this case. All pressure points were well padded PAS stocking applied bilateral lower extremity to prevent DVT.The arms at the side to 90 90.Prior to prep the C arm was brought to into the field and used to identify the angles in the axial and sagittal plane for imaging of the tear drop for the iliac screws extending from the posterior superior iliac spine to the anterior inferior iliac spine. Also the angle for insertion for placement of screws above the sciatic notch and the hip were determined. The C-arm radiology technologist was able to keep these angles for intraoperative C-arm guidance and placement of the iliac screws. Standard prep DuraPrep solution. Draped in the usual manner.  Iodine Vi-Drape was used  and the old incision scar was marked. Time-out procedure was called and correct. Skin in the midline between L1 and S2 was then infiltrated with local anesthesia exparel 1.3% total 20 cc used. Incision was then made extending from L1-S1 through the skin and subcutaneous layers down to the patient's lumbodorsal fascia and spinous  processes. ellipsing the old incision scar at L1 to S2The incision then carried sharply excising the supraspinous ligament and then continuing the lateral aspect of the spinous processes of L1, L2,L5, S1 and S2. Cobb elevator used to carefully elevate the paralumbar muscles off of the posterior elements using electrocautery carefully drilled bleeding and perform dissection of the muscle tissues of the preserving the facet capsule at the L1-2. Continuing the exposure out laterally to expose the lateral margin of the facet joint line at  L2-3. The caudal levels L3 through S2 Exposed out to the hardware bilaterally exposing the pedicle screws and rods L3, L4, L5 and S1.Incision was carried in the midline down to the L5 level area bleeders controlled using electrocautery monopolar electrocautery.  The bilateral pedicle screw and rod construct at the L3 toS1 level was also exposed. The hardware used to identify the lumbar levels. Vicryl retractors were placed Loupe magnification and headlight were used for this portion of the procedure. Attempt initially made to expose the posterior superior iliac spine at the caudal portion of the subcutaneous and muscular level. This was unsuccessful so a incision was made just superficial to the lumbosacral fascia at the lower end of the incision over the left and right lumbosacral level exposing the posterior superior iliac spine periosteal dissection then carried medially and superiorly. C-arm fluoroscopy was then brought into the field and using C-arm fluoroscopy then a hole made into the medial aspect of the right posterior superior iliac line. Initial drill hole made approximately a centimeter and a half medial and superior to the prominence of the right PSIS. C-arm fluoroscopy was used to place this initial opening hole allow for directing the placement of the initial drill hole so that a Jamshidi trocar could be introduced in the drill hole directed proximally 20-25  caudal and approximately 30-35 lateral. After advancing the trocar about 20-30 mm a lateral radiograph on C-arm was straight at the trocar directed superior to the patient's sciatic notch and hip. The teardrop view was straight at the trocar placed directly into the teardrop centrally. With this then the Jamshidi was advanced to a total depth of 90 mm platelet contained within the teardrop on the oblique view of the right hemipelvis. With the needle and placed on the lateral radiograph of the pelvis demonstrated the level of the needle superior to the sciatic notch and the hip joint. Care was taken to ensure that the needle had been placed over 90 mm for placement of an 8 mm x 90 mm screw. Tapping was then performed using a 7 mm tap a full 90 mm end to end. Observed again on the C-arm fluoroscopy to be a position within the teardrop on the oblique view and superior to the sciatic notch and right hip. An 8 mm tap was also used for this purpose end to end 90 mm. C-arm used to demonstrate again the tap placed well within the inner tables of the iliac crest within the teardrop on the oblique view and also the superior to the sciatic notch and the right hip. A 90 mm x 8 mm iliac screw was then placed and obtained excellent purchase on the right  side. Observed on C-arm fluoroscopy to be in good position alignment. Using a similar approach to the left superior iliac spine, a subcutaneous fascial plane using C-arm fluoroscopy then a hole made into the medial aspect of the left posterior superior iliac line. Initial drill hole made approximately a centimeter and a half medial and superior to the prominence of the left PSIS. C-arm fluoroscopy was used to place this initial opening hole allow for directing the placement of the initial drill hole so that a Jamshidi trocar could be introduced in the drill hole directed proximally 20-25 caudal and approximately 30-35 lateral. After advancing the trocar about 20-30 mm a lateral  radiograph on C-arm was straight at the trocar directed superior to the patient's sciatic notch and hip. The teardrop view was straight at the trocar placed directly into the teardrop centrally. With this then the Jamshidi was advanced to a total depth of 90 mm platelet contained within the teardrop on the oblique view of the left hemipelvis. With the needle and placed on the lateral radiograph of the pelvis demonstrated the level of the needle superior to the sciatic notch and the hip joint. Care was taken to ensure that the needle had been placed over 90 mm for placement of an 8 mm x 90 mm screw. Tapping was then performed using a 7 mm tap a full 90 mm end to end. Observed again on the C-arm fluoroscopy to be a position within the teardrop on the oblique view and superior to the sciatic notch and left hip. An 8 mm tap was also used for this purpose end to end 90 mm. C-arm used to demonstrate again the tap placed well within the inner tables of the iliac crest within the teardrop on the oblique view and also the superior to the sciatic notch and the left hip. A 90 mm x 8 mm iliac screw was then placed and obtained excellent purchase on the left side. Testing with neuromontoring soft tissue resistance demonstrated 18 and 17 values for soft tissue resistance indicating no sign of broach of the iliac outer table. A self retaining retractor then placed over the posterior aspect of the lamina at the expected L2-3 level.The appropriate level L2-3 identified at the level of the L3 pedicle screw extending superiorly. The operating room microscope sterilely draped brought into the field. Under the operating room microscope, the L2-3 interspace carefully debrided the small amount of muscle attachment here and high-speed bur used to drill the medial aspect of the inferior articular process of L4 approximately 10%. 2 mm Kerrison then used to enter the spinal canal over the superior aspect of the L3 lamina carefully using the  Kerrison to debris the attachment as a curet. Foraminotomy was then performed over the L3 nerve root. The medial 10% superior articular process of L3 and then resected using an osteotome and 2 mm Kerrison. This allowed for identification of the thecal sac. Penfield 4 was then used to carefully mobilize the thecal sac medially and the L3 nerve root identified within the lateral recess flattened over the posterior aspect of the herniated disc. Carefully the lateral aspect of the L3 nerve root was identified and a Penfield 4 was used to mobilize the nerve medially such that the disc was visible with microscope.No disc protrusion noted but disc bulge with severe hypertrophic ligament thicking. Further foraminotomies was performed over the left L3 nerve root the nerve root was noted to be decompressed. The nerve root able to be retracted along the  medial aspect of the L3 pedicle and foramenotomy completed. Ligamentum flavum was further debrided superiorly to the level L2-3 disc. Had a moderate amount of further resection of the L3 lamina inferiorly was performed. With this then the disc space at L2-3 was easily visualized. intraoperative Lateral radiograph was used to identify the L2-3 disc with the Penfield 4 In place just below the disc space at the end of the case. Ligamentum flavum was debrided and lateral recess along the medial aspect L2-3 facet no further decompression was necessary. Ball tip nerve probe was then able to carefully palpate the neuroforamen for L2 and L3 finding these to be well decompressed. Attention then turned to the right L2-3 level which was easily visualized with the microscope. Soft tissues debrided about the posterior aspect of the L2-3 interspace. High-speed bur and then used to carefully drill inferior 3 or 4 mm of the right side L2 lamina and on the medial aspect of the right L2 inferior articular process of 3 mm. The superior margin of the L3 lamina then carefully debrided with curette  and a 2 mm kerrison then used to enter the spinal canal over the superior aspect of the L3 lamina resecting bone over the superior aspect and freeing up the attachment of ligamentum flavum here. Ligamentum flavum then debrided with the 2 mm and 3 mm Kerrisons we decompressed the L3 nerve root and the lateral recess right L2-3 decompressed using 2 and 3 mm Kerrisons sizing hypertrophic reflected ligamentum flavum extending superiorly. From was resected off the ventral aspect of the inferior margin of the L2 lamina. Hockey-stick nerve probe could then be passed out the L3 neuroforamen and the L2 neuroforamen. Venous bleeding encountered. Thrombin-soaked Gelfoam used to control this following this then the sac and the L3 nerve root were mobilized medially and the L2-3 disc examined and found not to be herniated. Irrigation was carried out down to this bleeding controlled with Gelfoam. Gelfoam was then removed. Irrigation carried careful examination demonstrated no active bleeding present.  Following irrigation the left L5-S1 level was exposed boss McCollough retractor was inserted in addition to Beckman retractors. Medial border of previous laminotomy at the L5 S1 level was then identified using curettes and blunt dissection. Osteotomes then used to resect a portion of the medial superior articular process of S1. Additionally the superior border of the S1 pedicle surely resected in order to expose the medial and superior aspect of the left S1 pedicle. This allowed for exposure of the left L5 neuroforamen area carefully the bone overlying the foramen was drilled to within layer and then resected using 2 mm millimeters Kerrisons. The L5 nerve root was then able to be freed up with a Penfield 4 carefully lifting superior and exposing the L5-S1 disc posterior laterally. Spur was found to be present as well as bone impressing on the L5 nerve root. Lateral aspect of the thecal sac was then able to be mobilized medially to  the medial border of the S1 pedicle and the L5-S1 Concorde cage identified along with some bone graft retropulsed into the left side of the spinal canal by about 1 or 2 mm. Using Epstein curettes then on graft and the disc entered and removed off of the superior aspect of the S1 vertebral body lip. The disc space was debrided using pituitary rongeurs of loosened debris and old bone graft. The cage identified and a Penfield 4 used to free up the posterior aspect of the cage from the thecal sac. A cage  impactor was then able to be used to impact the cage and placing it back within the intervertebral disc space rocks of 5-10 mm anterior to the posterior aspect of the disc space at the L5-S1 level. This completed the decompression in the patient's left L5 neuroforamen and replacement of the left L5-S1 cage within the left L5-S1 intervertebral disc space area. The loosened left S1 pedicle screw was then removed and revised with a 63m x 464mexpedium pedicle Screw, original was 52m64m 40 mm expedium pedicle screw.  Attention then turned to placement of right-sided L3-4, L4-5 and L5-S1 TLIFs.  Right side decompression was carried out but near complete facetectomies were perform on the right at L4-5, L3-4,and L5-S1 to provide for exposure of the right side L4-5, L3-4 and L5-S1 neuroforamen for ease of placement of TLIFs (transforaminal lumbar interbody fusion) at each level inferior portions of the lamina and pars were also resected first beginning with the Leksell rongeur and osteotomes and then resecting using 2 and 3 mm Kerrison. Continued laminectomy was carried out resecting the central portions of the residual lamina of right L3,L4, and L5 performing foraminotomies on the right side at the L1, L2, L3, L4 and L5 levels. The inferior articular process L5, L4 and L3 were resected on the right side. The S1 nerve root identified along the right side medial aspect of the S1 pedicle. Superior articular process of S1 was  then resected from the right side further decompressing the right L5 nerve and providing for exposure of the area just superior to the S1 pedicle for a placement of right L5-S1 cage. Sterile OR Microscope used during this portion procedure. Attention then turned to placement of the transforaminal lumbar interbody fusion cages. Using a Penfield 4 the right lateral aspect of the thecal sac at the L5-S1 disc space was carefully freed up The thecal sac could then easily be retracted in the posterior lateral aspect of the L5-S1 disc was exposed 15 blade scalpel used to incise the posterolateral disc and an osteotome used to resect a small portion of bone off the superior aspect of the posterior superior vertebral body of S1 in order to ease the entry into the L5-S1 disc space. A 2mm35mrrison rongeur was then able to be introduced in the disc space debrided it was quite narrow. 7 mm dilator was used to dialate the L5-S1 disc space on the right side attempts were made to dilate further the 8 mm , 9 mm, 10mm59mmm 15m 12mm w63msuccessful and using small curettes and the disc space was debrided a moderate degenerative disc present in the endplates debrided to bleeding endplate bone. A 12 mm x 252mm lo43mic TPAL cage was carefully packed with morcellized bone graft and the been harvested from previous laminotomies and vivigen.The cage was then inserted with the insertion handle observed to be in good depth the rotated to transverse position, due to the left sided cage present the cage was left oblique. Additional bone graft was packed into the intervertebral disc space several times prior to placement of the concord cage. Bleeding controlled using bipolar electrocautery thrombin soaked gel cottonoids.  Using a Penfield 4 the right lateral aspect of the thecal sac at the L4-5 disc space was carefully freed up The thecal sac could then easily be retracted in the posterior lateral aspect of the L4-5 disc was exposed 15  blade scalpel used to incise the posterolateral disc and an osteotome used to resect a small portion of bone  off the superior aspect of the posterior superior vertebral body of L5 in order to ease the entry into the L4-5 disc space. A 58m kerrison rongeur was then able to be introduced in the disc space debrided it was quite narrow. 7 mm dilator was used to dialate the L4-5 disc space on the right side attempts were made to dilate further the 8 mm , 9 mm, 135m 1157mnd 40m27mre successful and using small curettes and the disc space was debrided a moderate degenerative disc present in the endplates debrided to bleeding endplate bone. A 12 mm x 27mm35mdotic concord cage was carefully packed with morcellized bone graft and the been harvested from previous laminotomies incombination with vivigen.The cage was then inserted with the insertion handle. Additional bone graft was packed into the intervertebral disc space several times prior to placement of the concord cage. Bleeding controlled using bipolar electrocautery thrombin soaked gel cottonoids. The L4 nerve root identified along the right side medial aspect of the L4 pedicle. Superior articular process of L4 was then resected from the right side further decompressing the right L4 nerve and providing for exposure of the area just superior to the L4 pedicle for a placement of right L3-4 cage.Using a Penfield 4 the right lateral aspect of the thecal sac at the L3-4 disc space was carefully freed up The thecal sac could then easily be retracted in the posterior lateral aspect of the L3-4 disc was exposed 15 blade scalpel used to incise the posterolateral disc and an osteotome used to resect a small portion of bone off the superior aspect of the posterior superior vertebral body of L4 in order to ease the entry into the L3-4 disc space. A 2mm k28mison rongeur was then able to be introduced in the disc space debrided it was quite narrow. 7 mm dilator was used to  dialate the L3-4 disc space on the right side attempts were made to dilate further the 8 mm , 9 mm, 10mm a77mhen 11mm we58muccessful and using small curettes and the disc space was debrided a moderate degenerative disc present in the endplates debrided to bleeding endplate bone. A 11 mm x 23mm [id number] concord cage was carefully packed with morcellized bone graft and the been harvested from previous laminotomies.The cage was then inserted with the insertion handle. Additional bone graft was packed into the intervertebral disc space several times prior to placement of the concord cage. Bleeding controlled using bipolar electrocautery thrombin soaked gel cottonoids.The posterior intervertebral disc space was then packed with autogenous local bone graft that been harvested from the redo laminectomy and vivigen.Bleeding controlled using bipolar electrocautery thrombin soaked gel cottonoids.  The cages at L4-5, L3-4 and L5-S1 were placed anteriorly as best as possible to correct patient's kyphosis that was present. With this then the transforaminal lumbar interbody fusion portion of the case was completed bleeders were controlled using bipolar electrocautery thrombin-soaked Gelfoam were appropriate.Decortication of the lateral masses posterolateral carried out bilateral L5-S1, L4-5 and L3-4. These were packed with cancellous local bone graft. The right side first, a 5 mm titanium rods were then carefully further contoured using the french benders to fit the fasteners at the iliac screws to L3 levels to the rod sleeve placed bilaterally on the old preexisting L3-S1 pedicle screw rod construct and connected in line with the iliac screws. Rod sleeves were attached to the L3-S1 preexisting pedicle screw fasteners. The screw fastener tightened with a torque driver to 80 foot pounds. The 5  mm contoured rod was then placed into the pedicle screws on the left extending from L3-S1 sleeve on the right each of the caps were  carefully placed loosely tightened.The rod sleeve fastener was attached and tighted to 80 foot pounds Compression between the fasteners of L3 and the L4 fastener the L3 fastener was tightened to 80 foot lbs. Across the right side L3 and L4 screw fasteners compression was obtained on the right side between L4 and L5, then L3 and L4 by compressing between the fasteners and tightening the screw caps 85 pounds. Similarly this was done on the left side at L3-4 and L4-5 obtaining compression and tightened 85 pounds.Irrigation was carried out with copious amounts of saline solution this was done throughout the case. Cell Saver was used during the case. 1770 cc of cell saver blood was returned. Hockey stick neuroprobe was used to probe the neuroforamen bilateral L3, L4 and L5. And also used to probe the left L3, L4 and L5 neuroforamem, these were determined to be well decompressed. Permanent C-arm images were obtained in AP and lateral plane and oblique planes. Remaining local bone graft was then applied along both lateral posterior lateral region extending from L3 to S1 facet beds.Gelfoam was then removed spinal canal. The lumbodorsal musculature carefully exam debrided of any devitalized tissue following removal of Viper retractors were the bleeders were controlled using electrocautery. 1 gram of vancomycin powder appled throughout the deep muscle and posterior elements.  The area dorsal lumbar muscle were then approximated in the midline with interrupted #1 Vicryl sutures loose the dorsal fascia was reattached to the spinous process of L1 superiorly and L5 inferiorly this was done with #1 Vicryl sutures.The paralumbar muscles were then approximated with interrupted #1 vicryl. The remaining lumbodorsal fascia approximated with #1 vicryl. Subcutaneous layers then approximated using interrupted 0 Vicryl sutures and 2-0 Vicryl sutures. Skin was closed with a running subcutaneous stitch of 4-0 Vicryl Dermabond was  applied then MedPlex bandage. All instrument and sponge counts were correct. The patient was then returned to a supine position on her bed reactivated extubated and returned to the recovery room in satisfactory condition. Adequate hemostasis then with Gelfoam thrombin soaked cottonoids these were then removed when hemostasis had been obtained. Copious amount of irrigation was then carried out and devitalized tissue debrided. Cell Saver was used during this case however there was 2000 cc blood loss and a total of 750 cc of Cell Saver blood was returned. A medium Hemovac drain was placed in the left lower lumbar spine and the midline incision away from neural structures the deep paralumbar muscles with her approximated with 1 Vicryl sutures, the lumbodorsal fascia approximated with 0 and 1 Vicryl sutures. Subcutaneous layers were then approximated with interrupted 0 and 2-0 Vicryl sutures , skin closed with a running subcuticular 4-0 Vicryl suture. The bone was then applied. Mepilex bandage was then applied. The exiting Hemovac drain site and carefully bandage. All instrument and sponge counts were correct. Patient was then returned to the supine position reactivated extubated and returned to the recovery room in satisfactory condition Benjiman Core, PA-C perform the duties of assistant surgeon during this case. He was present from the beginning of the case to the end of the case assisting in transfer the patient from his stretcher to the OR table and back to the stretcher at the end of the case. Assisted in careful retraction and suction of the laminectomy site delicate neural structures operating under the operating room microscope. He  performed closure of the incision from the fascia to the skin applying the dressing.      Juno Bozard E  11/17/2014, 8:08 PM

## 2014-11-17 NOTE — Discharge Instructions (Addendum)
Call if there is increasing drainage, fever greater than 101.5, severe head aches, and worsening nausea or light sensitivity. If shortness of breath, bloody cough or chest tightness or pain go to an emergency room. No lifting greater than 10 lbs. Avoid bending, stooping and twisting. Use brace when sitting and out of bed even to go to bathroom. Walk in house for first 2 weeks then may start to get out slowly increasing distances up to one  Quarter mile by 4-6 weeks post op. After 5 days may shower and change dressing following bathing with shower.When bathing remove the brace shower and replace brace before getting out of the shower. If drainage, keep dry dressing and do not bathe the incision, use an moisture impervious dressing. Please call and return for scheduled follow up appointment 2 weeks from the time of surgery.   Acute Kidney Injury Acute kidney injury is any condition in which there is sudden (acute) damage to the kidneys. Acute kidney injury was previously known as acute kidney failure or acute renal failure. The kidneys are two organs that lie on either side of the spine between the middle of the back and the front of the abdomen. The kidneys:  Remove wastes and extra water from the blood.   Produce important hormones. These help keep bones strong, regulate blood pressure, and help create red blood cells.   Balance the fluids and chemicals in the blood and tissues. A small amount of kidney damage may not cause problems, but a large amount of damage may make it difficult or impossible for the kidneys to work the way they should. Acute kidney injury may develop into long-lasting (chronic) kidney disease. It may also develop into a life-threatening disease called end-stage kidney disease. Acute kidney injury can get worse very quickly, so it should be treated right away. Early treatment may prevent other kidney diseases from developing. CAUSES   A problem with blood flow to  the kidneys. This may be caused by:   Blood loss.   Heart disease.   Severe burns.   Liver disease.  Direct damage to the kidneys. This may be caused by:  Some medicines.   A kidney infection.   Poisoning or consuming toxic substances.   A surgical wound.   A blow to the kidney area.   A problem with urine flow. This may be caused by:   Cancer.   Kidney stones.   An enlarged prostate. SIGNS AND SYMPTOMS   Swelling (edema) of the legs, ankles, or feet.   Tiredness (lethargy).   Nausea or vomiting.   Confusion.   Problems with urination, such as:   Painful or burning feeling during urination.   Decreased urine production.   Frequent accidents in children who are potty trained.   Bloody urine.   Muscle twitches and cramps.   Shortness of breath.   Seizures.   Chest pain or pressure. Sometimes, no symptoms are present. DIAGNOSIS Acute kidney injury may be detected and diagnosed by tests, including blood, urine, imaging, or kidney biopsy tests.  TREATMENT Treatment of acute kidney injury varies depending on the cause and severity of the kidney damage. In mild cases, no treatment may be needed. The kidneys may heal on their own. If acute kidney injury is more severe, your health care provider will treat the cause of the kidney damage, help the kidneys heal, and prevent complications from occurring. Severe cases may require a procedure to remove toxic wastes from the body (dialysis) or  surgery to repair kidney damage. Surgery may involve:   Repair of a torn kidney.   Removal of an obstruction. HOME CARE INSTRUCTIONS  Follow your prescribed diet.  Take medicines only as directed by your health care provider.  Do not take any new medicines (prescription, over-the-counter, or nutritional supplements) unless approved by your health care provider. Many medicines can worsen your kidney damage or may need to have the dose adjusted.    Keep all follow-up visits as directed by your health care provider. This is important.  Observe your condition to make sure you are healing as expected. SEEK IMMEDIATE MEDICAL CARE IF:  You are feeling ill or have severe pain in the back or side.   Your symptoms return or you have new symptoms.  You have any symptoms of end-stage kidney disease. These include:   Persistent itchiness.   Loss of appetite.   Headaches.   Abnormally dark or light skin.  Numbness in the hands or feet.   Easy bruising.   Frequent hiccups.   Menstruation stops.   You have a fever.  You have increased urine production.  You have pain or bleeding when urinating. MAKE SURE YOU:   Understand these instructions.  Will watch your condition.  Will get help right away if you are not doing well or get worse.   This information is not intended to replace advice given to you by your health care provider. Make sure you discuss any questions you have with your health care provider.   Document Released: 08/15/2010 Document Revised: 02/20/2014 Document Reviewed: 09/29/2011 Elsevier Interactive Patient Education [employer].  Respiratory failure is when your lungs are not working well and your breathing (respiratory) system fails. When respiratory failure occurs, it is difficult for your lungs to get enough oxygen, get rid of carbon dioxide, or both. Respiratory failure can be life threatening.  Respiratory failure can be acute or chronic. Acute respiratory failure is sudden, severe, and requires emergency medical treatment. Chronic respiratory failure is less severe, happens over time, and requires ongoing treatment.  WHAT ARE THE CAUSES OF ACUTE RESPIRATORY FAILURE?  Any problem affecting the heart or lungs can cause acute respiratory failure. Some of these causes include the following:  Chronic bronchitis and emphysema (COPD).   Blood clot going to a lung (pulmonary embolism).    Having water in the lungs caused by heart failure, lung injury, or infection (pulmonary edema).   Collapsed lung (pneumothorax).   Pneumonia.   Pulmonary fibrosis.   Obesity.   Asthma.   Heart failure.   Any type of trauma to the chest that can make breathing difficult.   Nerve or muscle diseases making chest movements difficult. HOW WILL MY ACUTE RESPIRATORY FAILURE BE TREATED?  Treatment of acute respiratory failure depends on the cause of the respiratory failure. Usually, you will stay in the intensive care unit so your breathing can be watched closely. Treatment can include the following:  Oxygen. Oxygen can be delivered through the following:  Nasal cannula. This is small tubing that goes in your nose to give you oxygen.  Face mask. A face mask covers your nose and mouth to give you oxygen.  Medicine. Different medicines can be given to help with breathing. These can include:  Nebulizers. Nebulizers deliver medicines to open the air passages (bronchodilators). These medicines help to open or relax the airways in the lungs so you can breathe better. They can also help loosen mucus from your lungs.  Diuretics. Diuretic  medicines can help you breathe better by getting rid of extra water in your body.  Steroids. Steroid medicines can help decrease swelling (inflammation) in your lungs.  Antibiotics.  Chest tube. If you have a collapsed lung (pneumothorax), a chest tube is placed to help reinflate the lung.  Noninvasive positive pressure ventilation (NPPV). This is a tight-fitting mask that goes over your nose and mouth. The mask has tubing that is attached to a machine. The machine blows air into the tubing, which helps to keep the tiny air sacs (alveoli) in your lungs open. This machine allows you to breathe on your own.  Ventilator. A ventilator is a breathing machine. When on a ventilator, a breathing tube is put into the lungs. A ventilator is used when you  can no longer breathe well enough on your own. You may have low oxygen levels or high carbon dioxide (CO2) levels in your blood. When you are on a ventilator, sedation and pain medicines are given to make you sleep so your lungs can heal. SEEK IMMEDIATE MEDICAL CARE IF:  You have shortness of breath (dyspnea) with or without activity.  You have rapid breathing (tachypnea).  You are wheezing.  You are unable to say more than a few words without having to catch your breath.  You find it very difficult to function normally.  You have a fast heart rate.  You have a bluish color to your finger or toe nail beds.  You have confusion or drowsiness or both.   This information is not intended to replace advice given to you by your health care provider. Make sure you discuss any questions you have with your health care provider.   Document Released: 02/04/2013 Document Revised: 10/21/2014 Document Reviewed: 02/04/2013 Elsevier Interactive Patient Education 2016 Elsevier Inc[employer]  Acute Respiratory Distress Syndrome Acute respiratory distress syndrome is a life-threatening condition in which fluid collects in the lungs. This condition can cause severe shortness of breath and low oxygen levels in the blood. It can also cause the lungs and other vital organs to fail. The condition usually develops within 24 to 48 hours of an infection, illness, surgery, or injury. CAUSES This condition may be caused by:  An infection, such as sepsis or pneumonia.  A serious injury to the head or chest.  A major surgery.  A drug overdose.  Breathing in harmful chemicals or smoke.  Blood transfusions.  A blood clot in the lungs. SYMPTOMS Symptoms of this condition include:  Fever.  Difficulty breathing.  Bluish skin (cyanosis).  A fast or irregular heartbeat.  Low blood pressure (hypotension).  Agitation.  Confusion.  Lack of energy (lethargy).  Sweating. DIAGNOSIS This condition may be  diagnosed by using tests to rule out other diseases and conditions that cause similar symptoms. You may have:  A chest X-ray or CT scan.  Blood tests.  A sputum culture. In this test, you will be asked to spit so that a sample of lung fluid can be taken for testing.  Bronchoscopy. During this test a thin, flexible tool is passed into the mouth or nose, down the windpipe, and into the lungs. TREATMENT This condition may be treated with:  Oxygen. A breathing machine (ventilator) is often used to provide oxygen and help with breathing.  Medicine to help you relax (sedative).  Fluids and nutrients given through an IV tube.  Blood pressure medicine.  Steroid medicine to help decrease inflammation in the lungs.  Diuretic medicine to get rid of extra fluid  in the body. Additional treatment may be needed, depending on the cause of the condition. It can take up to 12 months to recover from this condition. Some people recover fully, but others may continue to have:  Weakness.  Shortness of breath.  Memory problems.  Depression. HOME CARE INSTRUCTIONS Until you recover from this condition:  Do not smoke.  Limit alcohol intake to no more than 1 drink per day for nonpregnant women and 2 drinks per day for men. One drink equals 12 oz of beer, 5 oz of wine, or 1 oz of hard liquor.  Ask friends and family to help you if daily activities make you tired. SEEK MEDICAL CARE IF:  You become short of breath with activity or while resting.  You develop a cough that does not go away.  You have a fever. SEEK IMMEDIATE MEDICAL CARE IF:  You have sudden shortness of breath.  You develop chest pain that does not go away.  You develop swelling or pain in one of your legs.  You cough up blood.   This information is not intended to replace advice given to you by your health care provider. Make sure you discuss any questions you have with your health care provider.   Document Released:  01/30/2005 Document Revised: 06/16/2014 Document Reviewed: 01/26/2014 Elsevier Interactive Patient Education [employer].
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
## 2014-11-17 NOTE — Brief Op Note (Signed)
11/17/2014  7:30 PM  PATIENT:  Phillip Rocha  54 y.o. 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 Retropulsion of Left L5-S1 Cage with left L5 foramenal entrapment.  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 L2-3 spinal stenosis. Retropulsion of left L5-S1 Cage with left L5 foramenal entrapment.  PROCEDURE:  Procedure(s): Redo transforaminal  lumbar interbody fusions L3-4, L4-5, L5-S1, Revision of hardware L3 to S1, L3 to S1 Posterolateral fusion, Iliac wing fixation screws, L5-S1 Cage, Viviven, cancellous bone chips, rods and screws (N/A) Bilateral L2-3 decompressive hemilaminectomy, exploration of left L5 neuroforamen with decompression of left L5 nerve root and reinsertion of the left L5-S1 cage.   SURGEON:  Surgeon(s) and Role:    Jessy Oto, MD - Primary  PHYSICIAN ASSISTANT:Todd Jelinski Ricard Dillon, PA-C   ANESTHESIA:   local and general Dr. Massagee/Dr. Kalman Shan.  EBL:  Total I/O In: 400 [I.V.:400] Out: - EBL 3800 cc   BLOOD ADMINISTERED:1770 CC CELLSAVER  DRAINS: (left medium hemovac drain lumbar spine) Hemovact drain(s) in the left lumbar spine with  Suction Open and Urinary Catheter (Foley)   LOCAL MEDICATIONS USED:  MARCAINE 0.5% 20cc and EXPAREL 1.6% Amount: 30 ml  SPECIMEN:  No Specimen  DISPOSITION OF SPECIMEN:  N/A  COUNTS:  YES  TOURNIQUET:  * No tourniquets in log *  DICTATION: .Dragon Dictation  PLAN OF CARE: Post op will transfer to 2300 for recovery, intubated.  PATIENT DISPOSITION:  ICU - intubated and hemodynamically stable.   Delay start of Pharmacological VTE agent (>24hrs) due to surgical blood loss or risk of bleeding: yes

## 2014-11-18 ENCOUNTER — Encounter (HOSPITAL_COMMUNITY): Payer: Self-pay | Admitting: Specialist

## 2014-11-18 ENCOUNTER — Inpatient Hospital Stay (HOSPITAL_COMMUNITY): Payer: Medicare HMO

## 2014-11-18 DIAGNOSIS — J9620 Acute and chronic respiratory failure, unspecified whether with hypoxia or hypercapnia: Secondary | ICD-10-CM

## 2014-11-18 DIAGNOSIS — I959 Hypotension, unspecified: Secondary | ICD-10-CM

## 2014-11-18 DIAGNOSIS — N179 Acute kidney failure, unspecified: Secondary | ICD-10-CM

## 2014-11-18 LAB — BLOOD GAS, ARTERIAL
Acid-base deficit: 4.3 mmol/L — ABNORMAL HIGH (ref 0.0–2.0)
Bicarbonate: 20.6 mEq/L (ref 20.0–24.0)
DRAWN BY: 301311
FIO2: 0.35
LHR: 28 {breaths}/min
O2 SAT: 88.3 %
PCO2 ART: 40.9 mmHg (ref 35.0–45.0)
PEEP: 5 cmH2O
Patient temperature: 99.3
TCO2: 21.9 mmol/L (ref 0–100)
VT: 600 mL
pH, Arterial: 7.326 — ABNORMAL LOW (ref 7.350–7.450)
pO2, Arterial: 60.5 mmHg — ABNORMAL LOW (ref 80.0–100.0)

## 2014-11-18 LAB — CBC
HCT: 25.8 % — ABNORMAL LOW (ref 39.0–52.0)
HEMATOCRIT: 31.9 % — AB (ref 39.0–52.0)
HEMOGLOBIN: 9.8 g/dL — AB (ref 13.0–17.0)
HEMOGLOBIN: 7.9 g/dL — AB (ref 13.0–17.0)
MCH: 22.8 pg — AB (ref 26.0–34.0)
MCH: 22.8 pg — ABNORMAL LOW (ref 26.0–34.0)
MCHC: 30.7 g/dL (ref 30.0–36.0)
MCHC: 30.6 g/dL (ref 30.0–36.0)
MCV: 74.4 fL — ABNORMAL LOW (ref 78.0–100.0)
MCV: 74.6 fL — ABNORMAL LOW (ref 78.0–100.0)
Platelets: 160 10*3/uL (ref 150–400)
Platelets: 147 10*3/uL — ABNORMAL LOW (ref 150–400)
RBC: 4.29 MIL/uL (ref 4.22–5.81)
RBC: 3.46 MIL/uL — ABNORMAL LOW (ref 4.22–5.81)
RDW: 17.1 % — AB (ref 11.5–15.5)
RDW: 17.3 % — AB (ref 11.5–15.5)
WBC: 21.2 10*3/uL — ABNORMAL HIGH (ref 4.0–10.5)
WBC: 31 10*3/uL — AB (ref 4.0–10.5)

## 2014-11-18 LAB — GLUCOSE, CAPILLARY
GLUCOSE-CAPILLARY: 137 mg/dL — AB (ref 65–99)
GLUCOSE-CAPILLARY: 142 mg/dL — AB (ref 65–99)
GLUCOSE-CAPILLARY: 157 mg/dL — AB (ref 65–99)
GLUCOSE-CAPILLARY: 159 mg/dL — AB (ref 65–99)
GLUCOSE-CAPILLARY: 197 mg/dL — AB (ref 65–99)
Glucose-Capillary: 137 mg/dL — ABNORMAL HIGH (ref 65–99)

## 2014-11-18 LAB — POCT I-STAT 3, ART BLOOD GAS (G3+)
Acid-base deficit: 3 mmol/L — ABNORMAL HIGH (ref 0.0–2.0)
Bicarbonate: 23.5 mEq/L (ref 20.0–24.0)
O2 Saturation: 92 %
PCO2 ART: 48.4 mmHg — AB (ref 35.0–45.0)
Patient temperature: 99.5
TCO2: 25 mmol/L (ref 0–100)
pH, Arterial: 7.297 — ABNORMAL LOW (ref 7.350–7.450)
pO2, Arterial: 74 mmHg — ABNORMAL LOW (ref 80.0–100.0)

## 2014-11-18 LAB — BASIC METABOLIC PANEL
Anion gap: 12 (ref 5–15)
BUN: 18 mg/dL (ref 6–20)
CALCIUM: 6.8 mg/dL — AB (ref 8.9–10.3)
CHLORIDE: 103 mmol/L (ref 101–111)
CO2: 22 mmol/L (ref 22–32)
CREATININE: 2.64 mg/dL — AB (ref 0.61–1.24)
GFR calc Af Amer: 29 mL/min — ABNORMAL LOW (ref >60)
GFR calc non Af Amer: 25 mL/min — ABNORMAL LOW (ref >60)
Glucose, Bld: 145 mg/dL — ABNORMAL HIGH (ref 65–99)
Potassium: 5.2 mmol/L — ABNORMAL HIGH (ref 3.5–5.1)
Sodium: 137 mmol/L (ref 135–145)

## 2014-11-18 LAB — APTT: aPTT: 28 seconds (ref 24–37)

## 2014-11-18 LAB — PROTIME-INR
INR: 1.16 (ref 0.00–1.49)
Prothrombin Time: 14.9 seconds (ref 11.6–15.2)

## 2014-11-18 MED ORDER — SODIUM CHLORIDE 0.9 % IV BOLUS (SEPSIS)
1000.0000 mL | Freq: Once | INTRAVENOUS | Status: AC
  Administered 2014-11-18: 1000 mL via INTRAVENOUS

## 2014-11-18 MED ORDER — PHENYLEPHRINE HCL 10 MG/ML IJ SOLN
30.0000 ug/min | INTRAVENOUS | Status: DC
  Administered 2014-11-18: 30 ug/min via INTRAVENOUS
  Administered 2014-11-19: 80 ug/min via INTRAVENOUS
  Filled 2014-11-18 (×3): qty 1

## 2014-11-18 MED ORDER — SODIUM CHLORIDE 0.9 % IV SOLN
INTRAVENOUS | Status: DC
  Administered 2014-11-19: 05:00:00 via INTRAVENOUS

## 2014-11-18 MED ORDER — ACETAMINOPHEN 160 MG/5ML PO SOLN
650.0000 mg | Freq: Four times a day (QID) | ORAL | Status: DC | PRN
  Administered 2014-11-19 – 2014-11-20 (×3): 650 mg via ORAL
  Filled 2014-11-18 (×3): qty 20.3

## 2014-11-18 MED ORDER — SENNOSIDES 8.8 MG/5ML PO SYRP
5.0000 mL | ORAL_SOLUTION | Freq: Every evening | ORAL | Status: DC | PRN
  Filled 2014-11-18: qty 5

## 2014-11-18 MED ORDER — FUROSEMIDE 10 MG/ML IJ SOLN
40.0000 mg | Freq: Four times a day (QID) | INTRAMUSCULAR | Status: DC
  Administered 2014-11-18: 40 mg via INTRAVENOUS
  Filled 2014-11-18: qty 4

## 2014-11-18 MED ORDER — PANTOPRAZOLE SODIUM 40 MG PO PACK
40.0000 mg | PACK | ORAL | Status: DC
  Administered 2014-11-19 – 2014-11-21 (×3): 40 mg
  Filled 2014-11-18 (×5): qty 20

## 2014-11-18 MED ORDER — SODIUM CHLORIDE 0.9 % IV BOLUS (SEPSIS)
500.0000 mL | Freq: Once | INTRAVENOUS | Status: AC
  Administered 2014-11-18: 500 mL via INTRAVENOUS

## 2014-11-18 MED FILL — Heparin Sodium (Porcine) Inj 1000 Unit/ML: INTRAMUSCULAR | Qty: 60 | Status: AC

## 2014-11-18 MED FILL — Sodium Chloride IV Soln 0.9%: INTRAVENOUS | Qty: 2000 | Status: AC

## 2014-11-18 MED FILL — Sodium Chloride Irrigation Soln 0.9%: Qty: 3000 | Status: AC

## 2014-11-18 NOTE — Progress Notes (Signed)
[facility] Progress Note Patient Name: Phillip Rocha DOB: 1960-06-23 MRN: [id number]   Date of Service  11/18/2014  HPI/Events of Note  RR dropped to 20 Was acidotic on RR 30  eICU Interventions  Will keep RR 24 until am ABG     Intervention Category Major Interventions: Acid-Base disturbance - evaluation and management  ALVA,RAKESH V. 11/18/2014, 2:53 AM

## 2014-11-18 NOTE — Progress Notes (Addendum)
Subjective: 1 Day Post-Op Procedure(s) (LRB): Redo transforaminal  lumbar interbody fusions L3-4, L4-5, L5-S1, Revision of hardware L3 to S1, L3 to S1 Posterolateral fusion, Iliac wing fixation screws, L5-S1 Cage, Viviven, cancellous bone chips, rods and screws (N/A) Awake, alert, restrained to prevent removal of ETT. Presently on PEEP of 8. Patient reports pain as marked.    Objective:   VITALS:  Temp:  [98.5 F (36.9 C)-99.6 F (37.6 C)] 99.6 F (37.6 C) (10/05 0400) Pulse Rate:  [104-116] 104 (10/05 0715) Resp:  [11-30] 24 (10/05 0715) BP: (79-137)/(49-76) 102/51 mmHg (10/05 0715) SpO2:  [92 %-99 %] 96 % (10/05 0715) FiO2 (%):  [45 %-100 %] 45 % (10/05 0600) Weight:  [174.635 kg (385 lb)] 174.635 kg (385 lb) (10/04 2000)  Neurologically intact ABD soft Neurovascular intact Sensation intact distally Intact pulses distally Dorsiflexion/Plantar flexion intact Drain to be removed today.  Dressing examined and shows minimal sangin   LABS  Recent Labs  11/17/14 1639 11/17/14 1907 11/17/14 1919 11/17/14 2207 11/18/14 0701  HGB 11.2* 12.2* 11.6* 11.0* 9.8*  WBC  --   --   --  25.0* 21.2*  PLT  --   --   --  192 160    Recent Labs  11/17/14 2207 11/18/14 0701  NA 136 137  K 4.9 5.2*  CL 100* 103  CO2 23 22  BUN 13 18  CREATININE 1.56* PENDING  GLUCOSE 196* 145*    Recent Labs  11/18/14 0701  INR 1.16     Assessment/Plan: 1 Day Post-Op Procedure(s) (LRB): Redo transforaminal  lumbar interbody fusions L3-4, L4-5, L5-S1, Revision of hardware L3 to S1, L3 to S1 Posterolateral fusion, Iliac wing fixation screws, L5-S1 Cage, Viviven, cancellous bone chips, rods and screws (N/A) Anemia from surgery and acute blood loss.   Up with therapy when able to be brought off ventilator. Continue foley due to diuresing patient, patient in ICU and urinary output monitoring Desires air mattress while in hospital. Hemovac removed.  Mariely Mahr E 11/18/2014, 7:51  AM

## 2014-11-18 NOTE — Progress Notes (Signed)
[facility] Progress Note Patient Name: KIRE FERG DOB: 1960/12/17 MRN: [id number]   Date of Service  11/18/2014  HPI/Events of Note  Multiple issures: 1. Hypoxia - nurse concerned about ETT position and 2. Oliguria. Had 12 hours of surgery yesterday. Last echo LVEF = 60%. No central line to monitor CVP. Little or no response to Lasix earlier today.   eICU Interventions  Will order: 1. Bolus with 0.9 NaCl 1 liter IV over 1 hour now.  2. Portable CXR now.      Intervention Category Intermediate Interventions: Oliguria - evaluation and management  Keiley Levey Eugene 11/18/2014, 4:45 PM

## 2014-11-18 NOTE — Care Management Note (Signed)
Case Management Note  Patient Details  Name: Phillip Rocha MRN: [id number] Date of Birth: 1960-04-07  Subjective/Objective:  Pt s/p L3 to S1 interbody fusion with revision of hardware on 11/17/14.  PTA, pt resided at home with son.               Action/Plan: Pt remains sedated and on ventilator.  Will follow for discharge planning needs as pt progresses.  Will need PT/OT when able to tolerate.    Expected Discharge Date:                  Expected Discharge Plan:  [name]  In-House Referral:     Discharge planning Services  CM Consult  Post Acute Care Choice:    Choice offered to:     DME Arranged:    DME Agency:     HH Arranged:    [place] Agency:     Status of Service:  In process, will continue to follow  Medicare Important Message Given:    Date Medicare IM Given:    Medicare IM give by:    Date Additional Medicare IM Given:    Additional Medicare Important Message give by:     If discussed at [place] of Stay Meetings, dates discussed:    Additional Comments:  Reinaldo Raddle, RN, BSN  Trauma/Neuro ICU Case Manager [phone]

## 2014-11-18 NOTE — Consult Note (Signed)
PULMONARY / CRITICAL CARE MEDICINE   Name: Phillip Rocha MRN: [id number] DOB: 1960-12-05    ADMISSION DATE:  11/17/2014 CONSULTATION DATE:  11/18/2014  REFERRING MD :  Dr. Louanne Skye  CHIEF COMPLAINT:  Back pain  INITIAL PRESENTATION:  54 yo male with back pain presented for re-do of L5-S1 fusion.  Remained on vent post-op.    STUDIES:   SIGNIFICANT EVENTS: 10/4 Redo transforaminal lumbar interbody fusions L3-4, L4-5, L5-S1, Revision of hardware L3 to S1, L3 to S1 Posterolateral fusion, Iliac wing fixation screws, L5-S1 Cage, Viviven, cancellous bone chips, rods and screws; EBL 3800 ml.  His surgery was about 12 hours with him in prone position.   HISTORY OF PRESENT ILLNESS:   Hx from Chart.  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  He had EBL 3800 ml.  He remained on vent post op.  PAST MEDICAL HISTORY :  He  has a past medical history of Hypertension; Anxiety; GERD (gastroesophageal reflux disease); Arthritis; Psoriatic arthritis ([place]); Diabetes mellitus without complication ([place]); Degenerative arthritis; Psoriasis; Morbid obesity ([place]); Claustrophobia; Anemia; Lymphadenopathy (08/25/2014); Shortness of breath dyspnea; Cough with sputum; and Headache.  PAST SURGICAL HISTORY: He  has past surgical history that includes Leg Surgery (Right, 89); Shoulder surgery (Right, 90s); Cholecystectomy; Total knee arthroplasty (Right, 10/01/2012); Total knee arthroplasty (Right, 10/01/2012); Joint replacement; Nose surgery (13); Total knee arthroplasty (Left, 08/21/2013); Back surgery (2015); Diagnostic laparoscopy (2013); Esophagogastroduodenoscopy (egd) with propofol (N/A, 10/17/2013); and Colonoscopy with propofol (N/A, 10/17/2013).   Prior to Admission medications   Medication Sig Start Date End Date Taking? Authorizing Provider  allopurinol (ZYLOPRIM) 100 MG tablet Take 100 mg by mouth 2 (two) times daily.   Yes Historical Provider, MD  amLODipine (NORVASC) 10 MG  tablet Take 10 mg by mouth every morning.    Yes Historical Provider, MD  Beclomethasone Dipropionate (QNASL) 80 MCG/ACT AERS Place 1 spray into the nose 2 (two) times daily.   Yes Historical Provider, MD  Calcium Citrate-Vitamin D (CALCIUM + D PO) Take 2 tablets by mouth daily.   Yes Historical Provider, MD  clobetasol ointment (TEMOVATE) 3.50 % Apply 1 application topically daily as needed (for psoriasis= apply to whole body= mix with aquaphor ointment).   Yes Historical Provider, MD  dexlansoprazole (DEXILANT) 60 MG capsule Take 60 mg by mouth daily.   Yes Historical Provider, MD  DYMISTA 137-50 MCG/ACT SUSP Place 1 spray into the nose daily.  06/18/14  Yes Historical Provider, MD  fexofenadine (ALLEGRA) 180 MG tablet Take 180 mg by mouth daily.   Yes Historical Provider, MD  hydrOXYzine (ATARAX/VISTARIL) 50 MG tablet Take 2 tablets (100 mg total) by mouth 3 (three) times daily as needed (itching). 08/22/13  Yes Scott Marcene Duos, MD  losartan (COZAAR) 50 MG tablet Take 50 mg by mouth every morning.    Yes Historical Provider, MD  Magnesium 500 MG TABS Take 1,000 mg by mouth 3 (three) times daily.   Yes Historical Provider, MD  Manganese 50 MG TABS Take 1 tablet by mouth every evening.   Yes Historical Provider, MD  metFORMIN (GLUCOPHAGE) 1000 MG tablet Take 1,000 mg by mouth 2 (two) times daily with a meal.   Yes Historical Provider, MD  mineral oil-hydrophilic petrolatum (AQUAPHOR) ointment Apply 1 application topically daily as needed for dry skin (for psoriasis= apply to whole body= mix with clobetasol 0.05% ointment).   Yes Historical Provider, MD  montelukast (SINGULAIR) 10 MG tablet Take 10  mg by mouth at bedtime.  08/21/14  Yes Historical Provider, MD  morphine (MS CONTIN) 15 MG 12 hr tablet Take 15 mg by mouth every 12 (twelve) hours.   Yes Historical Provider, MD  oxyCODONE (ROXICODONE) 30 MG immediate release tablet Take 1 tablet (30 mg total) by mouth 5 (five) times daily. 08/22/13  Yes  Scott Marcene Duos, MD  Secukinumab (COSENTYX SENSOREADY PEN) 150 MG/ML SOAJ Inject 300 mg into the skin every 30 (thirty) days.   Yes Historical Provider, MD  sertraline (ZOLOFT) 25 MG tablet Take 25 mg by mouth 2 (two) times daily.    Yes Historical Provider, MD  tiZANidine (ZANAFLEX) 4 MG tablet Take 4 mg by mouth 3 (three) times daily.  02/09/14  Yes Historical Provider, MD  vitamin B-12 (CYANOCOBALAMIN) 1000 MCG tablet Take 1,000 mcg by mouth daily.   Yes Historical Provider, MD  vitamin C (ASCORBIC ACID) 500 MG tablet Take 500 mg by mouth daily.  01/13/14  Yes Historical Provider, MD  zolpidem (AMBIEN) 10 MG tablet Take 10 mg by mouth at bedtime as needed for sleep.   Yes Historical Provider, MD   No Known Allergies  FAMILY HISTORY:  has no family status information on file.  SOCIAL HISTORY:  reports that he has never smoked. He has never used smokeless tobacco. He reports that he does not drink alcohol or use illicit drugs.  REVIEW OF SYSTEMS:   Unable to obtain.  SUBJECTIVE:   VITAL SIGNS: Temp:  [98.4 F (36.9 C)-98.7 F (37.1 C)] 98.5 F (36.9 C) (10/05 0000) Pulse Rate:  [83-116] 114 (10/05 0115) Resp:  [11-30] 18 (10/05 0115) BP: (88-139)/(51-83) 115/69 mmHg (10/05 0115) SpO2:  [92 %-99 %] 98 % (10/05 0115) FiO2 (%):  [70 %-100 %] 70 % (10/05 0000) Weight:  [382 lb (173.274 kg)-385 lb (174.635 kg)] 385 lb (174.635 kg) (10/04 2000) VENTILATOR SETTINGS: Vent Mode:  [-] PRVC FiO2 (%):  [70 %-100 %] 70 % Set Rate:  [16 bmp-30 bmp] 30 bmp Vt Set:  [600 mL] 600 mL PEEP:  [5 cmH20-12 cmH20] 12 cmH20 Plateau Pressure:  [22 ZPH15-05 cmH20] 28 cmH20 INTAKE / OUTPUT:  Intake/Output Summary (Last 24 hours) at 11/18/14 0120 Last data filed at 11/18/14 0000  Gross per 24 hour  Intake 8679.41 ml  Output   4555 ml  Net 4124.41 ml    PHYSICAL EXAMINATION: General: in ICU room Neuro:  RASS -1, follows commands HEENT:  Facial edema, ETT in place Cardiovascular:  Regular,  tachycardic Lungs:  Basilar crackles Abdomen:  Obese, soft Musculoskeletal:  1+ edema Skin:  Extensive changes from psoriasis  LABS:  CBC  Recent Labs Lab 11/17/14 1907 11/17/14 1919 11/17/14 2207  WBC  --   --  25.0*  HGB 12.2* 11.6* 11.0*  HCT 36.0* 34.0* 35.5*  PLT  --   --  192   Coag's No results for input(s): APTT, INR in the last 168 hours.   BMET  Recent Labs Lab 11/17/14 1639 11/17/14 1907 11/17/14 1919 11/17/14 2207  NA 137 137 137 136  K 5.3* 5.7* 5.6* 4.9  CL  --   --   --  100*  CO2  --   --   --  23  BUN  --   --   --  13  CREATININE  --   --   --  1.56*  GLUCOSE 162* 152*  --  196*   Electrolytes  Recent Labs Lab 11/17/14 2207  CALCIUM 7.3*  Sepsis Markers No results for input(s): LATICACIDVEN, PROCALCITON, O2SATVEN in the last 168 hours.   ABG  Recent Labs Lab 11/17/14 1919 11/17/14 2105 11/17/14 2223  PHART 7.204* 7.195* 7.261*  PCO2ART 66.3* 63.0* 48.4*  PO2ART 86.0 80.0 72.0*   Liver Enzymes No results for input(s): AST, ALT, ALKPHOS, BILITOT, ALBUMIN in the last 168 hours.   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  EXAM: DG C-ARM GT 120 MIN; LUMBAR SPINE - 2-3 VIEW  FLUOROSCOPY TIME:  Fluoroscopy Time (in minutes and seconds): 2 minutes 20 seconds  COMPARISON:  CT scan of the lumbar spine dated 05/06/2014  FINDINGS: AP and lateral C-arm images demonstrate revision of the posterior hardware with new iliac wing fixation screws.  IMPRESSION: Revision of lower spinal fusion.   Electronically Signed   By: Lorriane Shire M.D.   On: 11/17/2014 19:02   Dg Chest Port 1 View  11/17/2014   CLINICAL DATA:  Acute respiratory failure.  EXAM: PORTABLE CHEST 1 VIEW   COMPARISON:  November 09, 2014.  FINDINGS: Endotracheal tube is projected over tracheal air shadow with distal tip 5 cm above the carina. No pneumothorax is noted. Hypoinflation of the lungs is noted. Increased central pulmonary vascular congestion is noted. New right basilar opacity is noted most consistent with subsegmental atelectasis or less likely pneumonia. No significant pleural effusion is noted. Bony thorax is unremarkable.  IMPRESSION: Endotracheal tube in grossly good position. Hypoinflation of the lungs is noted. Increased central pulmonary vascular congestion is noted. Probable right basilar subsegmental atelectasis or pneumonia.   Electronically Signed   By: Marijo Conception, M.D.   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  EXAM: DG C-ARM GT 120 MIN; LUMBAR SPINE - 2-3 VIEW  FLUOROSCOPY TIME:  Fluoroscopy Time (in minutes and seconds): 2 minutes 20 seconds  COMPARISON:  CT scan of the lumbar spine dated 05/06/2014  FINDINGS: AP and lateral C-arm images demonstrate revision of the posterior hardware with new iliac wing fixation screws.  IMPRESSION: Revision of lower spinal fusion.   Electronically Signed   By: Lorriane Shire M.D.   On: 11/17/2014 19:02     ASSESSMENT / PLAN:  PULMONARY ETT 10/4 >> A: Acute respiratory failure with hypoxia/hypercapnia. Hx of allergic rhinitis. P:   Assess for vent weaning in AM >> maintain airway until facial swelling improved Adjust PEEP/FiO2 to keep SpO2 > 92% F/u CXR, ABG Hold outpt qnasl, dymista, allegra, singulair for now  CARDIOVASCULAR A:  Hypotension 2nd to hypovolemia, and blood loss. Hx of HTN. P:  Continue IV fluids Hold outpt norvasc, cozaar  RENAL A:   AKI from volume loss >> Baseline Creatinine 0.77 from 11/09/14. Hyperkalemia. P:   Optimize hemodynamics Monitor renal fx, urine outpt,  electrolytes  GASTROINTESTINAL A:   Nutrition. Morbid obesity. Hx of GERD. P:   Protonix (uses Dexilant as outpt) Tube feeds if not able to extubate soon  HEMATOLOGIC A:   Anemia related to blood loss from surgery. Leukocytosis 2nd to acute stress after surgery. P:  F/u CBC SCD's Add SQ heparin when okay with ortho  INFECTIOUS A:   Post-op prophylaxis. P:   Ancef per ortho  ENDOCRINE A:   DM type II.   Hx of Gout. P:   SSI Allopurinol, glucophage  NEUROLOGIC A:   Sedation. Hx of anxiety. P:   RASS goal: -1 Hold outpt zoloft, zanaflex, ambien, oxycodone, MS contin  ORTHOPEDICS A: S/p Re-do lumbar spine. P: Post op care per ortho  CC time 37 minutes.  Chesley Mires, MD [facility] Pulmonary/Critical Care 11/18/2014, 1:21 AM Pager:  [phone] After 3pm call: [phone]

## 2014-11-18 NOTE — Consult Note (Signed)
WOC consult requested by Dr Louanne Skye for air mattress.  Pt is at risk for skin breakdown with a Braden scale of 16 and has requested this intervention.  Orders placed for bedside nurses to order air mattress replacement. Please re-consult if further assistance is needed.  Thank-you,  Julien Girt MSN, [place], [place], [place], [place]

## 2014-11-18 NOTE — Progress Notes (Signed)
Pt having soft BP's, low urine output. On prop gtt and fent gtt. Md notified. Goal MAP >/65. Drips titrated. Will continue to monitor

## 2014-11-18 NOTE — Progress Notes (Signed)
Dr Levada Schilling notified of patient with decreasing O2 sats after ventilator changes Dr Nelda Marseille made after 1400. Repeat ABG obtained and results given. Requested followup chest xray, order received. Also notified of U/O at approx 1m/hr. Orders received for 1L bolus. Bolus hung. Will continue to monitor.

## 2014-11-18 NOTE — Progress Notes (Signed)
[place] Progress Note Patient Name: Phillip Rocha DOB: 1961-02-08 MRN: [id number]   Date of Service  11/18/2014  HPI/Events of Note  Oliguria.   eICU Interventions  Will bolus with 0.9 NaCl 1 liter IV over 1 hour now.      Intervention Category Intermediate Interventions: Oliguria - evaluation and management  Sommer,Steven Eugene 11/18/2014, 6:56 PM

## 2014-11-18 NOTE — Progress Notes (Signed)
[place] Progress Note Patient Name: Phillip Rocha DOB: [birth year] MRN: [id number]   Date of Service  11/18/2014  HPI/Events of Note  Hypotension - BP = 91/43 (MAP = 52). Oozing from incision.   eICU Interventions  Will order: 1. CBC now. 2. 0.9 NaCl 1 liter IV over 1 hour now.  3. D/C Q 6 hour Lasix doses. 4. Phenylephrine IV infusion. Titrate to MAP >= 65.      Intervention Category Major Interventions: Hypotension - evaluation and management  Luxe Cuadros Eugene 11/18/2014, 8:32 PM

## 2014-11-18 NOTE — Progress Notes (Signed)
RT advanced patients tube 5cm per Doctors order per CXR. RT will continue to monitor.

## 2014-11-18 NOTE — Progress Notes (Signed)
PT Cancellation Note  Patient Details Name: Phillip Rocha MRN: [id number] DOB: [birth year]   Cancelled Treatment:    Reason Eval/Treat Not Completed: Patient not medically ready.  Pt still on the vent.  Will evaluate 10/6 as able. 11/18/2014  Donnella Sham, [place] [phone]  (pager)   Phillip Rocha, Tessie Fass 11/18/2014, 3:39 PM

## 2014-11-18 NOTE — Progress Notes (Signed)
Dr Nelda Marseille made aware of patient vocalizing around his ETT. Pt with O2 in upper 90s, receiving ventilator volumes, cuff pressure at 80 after air being added to cuff. No new orders received, will continue to monitor.

## 2014-11-18 NOTE — Progress Notes (Signed)
PULMONARY / CRITICAL CARE MEDICINE   Name: Phillip Rocha MRN: [id number] DOB: Jun 27, 1960    ADMISSION DATE:  11/17/2014 CONSULTATION DATE:  11/18/2014  REFERRING MD :  Dr. Louanne Skye  CHIEF COMPLAINT:  Back pain  INITIAL PRESENTATION:  54 yo male with back pain presented for re-do of L5-S1 fusion.  Remained on vent post-op.    STUDIES:   SIGNIFICANT EVENTS: 10/4 Redo transforaminal lumbar interbody fusions L3-4, L4-5, L5-S1, Revision of hardware L3 to S1, L3 to S1 Posterolateral fusion, Iliac wing fixation screws, L5-S1 Cage, Viviven, cancellous bone chips, rods and screws; EBL 3800 ml.  His surgery was about 12 hours with him in prone position.   HISTORY OF PRESENT ILLNESS:   Hx from Chart.  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  He had EBL 3800 ml.  He remained on vent post op.  SUBJECTIVE: No events overnight  VITAL SIGNS: Temp:  [98.5 F (36.9 C)-99.6 F (37.6 C)] 98.6 F (37 C) (10/05 1129) Pulse Rate:  [103-116] 104 (10/05 0915) Resp:  [11-30] 24 (10/05 0915) BP: (79-137)/(49-76) 92/63 mmHg (10/05 0915) SpO2:  [92 %-99 %] 96 % (10/05 0915) FiO2 (%):  [45 %-100 %] 45 % (10/05 0900) Weight:  [174.635 kg (385 lb)] 174.635 kg (385 lb) (10/04 2000) VENTILATOR SETTINGS: Vent Mode:  [-] PRVC FiO2 (%):  [45 %-100 %] 45 % Set Rate:  [16 bmp-30 bmp] 24 bmp Vt Set:  [600 mL] 600 mL PEEP:  [5 cmH20-12 cmH20] 8 cmH20 Plateau Pressure:  [20 YFV49-44 cmH20] 21 cmH20 INTAKE / OUTPUT:  Intake/Output Summary (Last 24 hours) at 11/18/14 1149 Last data filed at 11/18/14 0900  Gross per 24 hour  Intake 7755.7 ml  Output   3235 ml  Net 4520.7 ml    PHYSICAL EXAMINATION: General: in ICU room Neuro:  RASS -1, follows commands HEENT:  Facial edema improving, ETT in place Cardiovascular:  Regular, tachycardic Lungs:  Basilar crackles Abdomen:  Obese, soft Musculoskeletal:  1+ edema Skin:  Extensive changes from  psoriasis  LABS:  CBC  Recent Labs Lab 11/17/14 1919 11/17/14 2207 11/18/14 0701  WBC  --  25.0* 21.2*  HGB 11.6* 11.0* 9.8*  HCT 34.0* 35.5* 31.9*  PLT  --  192 160   Coag's  Recent Labs Lab 11/18/14 0701  APTT 28  INR 1.16   BMET  Recent Labs Lab 11/17/14 1907 11/17/14 1919 11/17/14 2207 11/18/14 0701  NA 137 137 136 137  K 5.7* 5.6* 4.9 5.2*  CL  --   --  100* 103  CO2  --   --  23 22  BUN  --   --  13 18  CREATININE  --   --  1.56* 2.64*  GLUCOSE 152*  --  196* 145*   Electrolytes  Recent Labs Lab 11/17/14 2207 11/18/14 0701  CALCIUM 7.3* 6.8*   Sepsis Markers No results for input(s): LATICACIDVEN, PROCALCITON, O2SATVEN in the last 168 hours.   ABG  Recent Labs Lab 11/17/14 2105 11/17/14 2223 11/18/14 0419  PHART 7.195* 7.261* 7.297*  PCO2ART 63.0* 48.4* 48.4*  PO2ART 80.0 72.0* 74.0*   Liver Enzymes No results for input(s): AST, ALT, ALKPHOS, BILITOT, ALBUMIN in the last 168 hours.   Cardiac Enzymes  Recent Labs Lab 11/17/14 2207  TROPONINI <0.03   Glucose  Recent Labs Lab 11/17/14 0647 11/17/14 2022 11/18/14 0007 11/18/14 0356 11/18/14 0746  GLUCAP 151* 165* 197* 137* 159*  Imaging Dg Lumbar Spine 2-3 Views  11/17/2014   CLINICAL DATA:  Loosening of lumbar fusion hardware with pseudoarthrosis at multiple levels.  EXAM: DG C-ARM GT 120 MIN; LUMBAR SPINE - 2-3 VIEW  FLUOROSCOPY TIME:  Fluoroscopy Time (in minutes and seconds): 2 minutes 20 seconds  COMPARISON:  CT scan of the lumbar spine dated 05/06/2014  FINDINGS: AP and lateral C-arm images demonstrate revision of the posterior hardware with new iliac wing fixation screws.  IMPRESSION: Revision of lower spinal fusion.   Electronically Signed   By: Lorriane Shire M.D.   On: 11/17/2014 19:02   Dg Chest Port 1 View  11/18/2014   CLINICAL DATA:  Atelectasis.  EXAM: PORTABLE CHEST 1 VIEW  COMPARISON:  11/17/2014 .  FINDINGS: Endotracheal tube in stable position.  Cardiomegaly. Normal pulmonary vascularity. Slight improvement of bibasilar atelectasis and/or infiltrates. No pleural effusion or pneumothorax.  IMPRESSION: 1. Endotracheal tube in good anatomic position. 2. Slight improvement of bibasilar atelectasis and/or infiltrates. 3. Stable cardiomegaly.   Electronically Signed   By: [name]  Register   On: 11/18/2014 07:50   Dg Chest Port 1 View  11/17/2014   CLINICAL DATA:  Acute respiratory failure.  EXAM: PORTABLE CHEST 1 VIEW  COMPARISON:  November 09, 2014.  FINDINGS: Endotracheal tube is projected over tracheal air shadow with distal tip 5 cm above the carina. No pneumothorax is noted. Hypoinflation of the lungs is noted. Increased central pulmonary vascular congestion is noted. New right basilar opacity is noted most consistent with subsegmental atelectasis or less likely pneumonia. No significant pleural effusion is noted. Bony thorax is unremarkable.  IMPRESSION: Endotracheal tube in grossly good position. Hypoinflation of the lungs is noted. Increased central pulmonary vascular congestion is noted. Probable right basilar subsegmental atelectasis or pneumonia.   Electronically Signed   By: Marijo Conception, M.D.   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  EXAM: DG C-ARM GT 120 MIN; LUMBAR SPINE - 2-3 VIEW  FLUOROSCOPY TIME:  Fluoroscopy Time (in minutes and seconds): 2 minutes 20 seconds  COMPARISON:  CT scan of the lumbar spine dated 05/06/2014  FINDINGS: AP and lateral C-arm images demonstrate revision of the posterior hardware with new iliac wing fixation screws.  IMPRESSION: Revision of lower spinal fusion.   Electronically Signed   By: Lorriane Shire M.D.   On: 11/17/2014 19:02   ASSESSMENT / PLAN:  PULMONARY ETT 10/4 >> A: Acute respiratory failure with hypoxia/hypercapnia. Hx of allergic rhinitis. P:   Diureses today Decrease PEEP to 5  and FiO2 to 35%. F/u CXR, ABG Increase RR to 28 with F/U ABG. Hold outpt qnasl, dymista, allegra, singulair for now.  CARDIOVASCULAR A:  Hypotension 2nd to hypovolemia, and blood loss. Hx of HTN. P:  KVO IVF Hold outpt norvasc, cozaar  RENAL A:   AKI from volume loss >> Baseline Creatinine 0.77 from 11/09/14. Hyperkalemia. P:   Optimize hemodynamics as able Monitor renal fx, urine outpt, electrolytes Replace electrolytes as indicated. BMET in AM.  GASTROINTESTINAL A:   Nutrition. Morbid obesity. Hx of GERD. P:   Protonix (uses Dexilant as outpt). Hold TF today, will start in AM if not extubatable in AM.  HEMATOLOGIC A:   Anemia related to blood loss from surgery. Leukocytosis 2nd to acute stress after surgery. P:  F/u CBC SCD's Add SQ heparin when okay with ortho  INFECTIOUS A:  Post-op prophylaxis. P:   Ancef per ortho  ENDOCRINE A:   DM type II.   Hx of Gout. P:   SSI Allopurinol, glucophage  NEUROLOGIC A:   Sedation. Hx of anxiety. P:   RASS goal: -1 Hold outpt zoloft, zanaflex, ambien, oxycodone, MS contin  ORTHOPEDICS A: S/p Re-do lumbar spine. P: Post op care per ortho  The patient is critically ill with multiple organ systems failure and requires high complexity decision making for assessment and support, frequent evaluation and titration of therapies, application of advanced monitoring technologies and extensive interpretation of multiple databases.   Critical Care Time devoted to patient care services described in this note is  35  Minutes. This time reflects time of care of this signee Dr Jennet Maduro. This critical care time does not reflect procedure time, or teaching time or supervisory time of PA/NP/Med student/Med Resident etc but could involve care discussion time.  Rush Farmer, M.D. [facility] Pulmonary/Critical Care Medicine. Pager: [phone]. After hours pager: [phone].

## 2014-11-18 NOTE — Progress Notes (Signed)
Surgical dressing saturated with blood and bleeding on bedpad. Dr. Lorin Mercy notified. Instructed to clean with betadine and apply new dressing. Will continue to monitor closely. MD aware of hypotension and low urine output. Contacted PCCM next.  Notified Dr. Oletta Darter [facility]) about bleeding, low urine output, hypotension and low grade temp of 100.4 F. New orders received.

## 2014-11-18 NOTE — Progress Notes (Signed)
OT Cancellation Note  Patient Details Name: Phillip Rocha MRN: [id number] DOB: 1960-05-17   Cancelled Treatment:    Reason Eval/Treat Not Completed: Other (comment) Pt on vent. Will assess appropriateness for OT tomorrow.  [place], OTR/L  [phone] 11/18/2014 11/18/2014, 11:30 AM

## 2014-11-19 ENCOUNTER — Inpatient Hospital Stay (HOSPITAL_COMMUNITY): Payer: Medicare HMO

## 2014-11-19 DIAGNOSIS — M960 Pseudarthrosis after fusion or arthrodesis: Principal | ICD-10-CM

## 2014-11-19 DIAGNOSIS — J9601 Acute respiratory failure with hypoxia: Secondary | ICD-10-CM

## 2014-11-19 DIAGNOSIS — J9600 Acute respiratory failure, unspecified whether with hypoxia or hypercapnia: Secondary | ICD-10-CM

## 2014-11-19 LAB — CBC
HCT: 26 % — ABNORMAL LOW (ref 39.0–52.0)
Hemoglobin: 8 g/dL — ABNORMAL LOW (ref 13.0–17.0)
MCH: 23 pg — AB (ref 26.0–34.0)
MCHC: 30.8 g/dL (ref 30.0–36.0)
MCV: 74.7 fL — ABNORMAL LOW (ref 78.0–100.0)
PLATELETS: 162 10*3/uL (ref 150–400)
RBC: 3.48 MIL/uL — AB (ref 4.22–5.81)
RDW: 17.5 % — ABNORMAL HIGH (ref 11.5–15.5)
WBC: 35 10*3/uL — ABNORMAL HIGH (ref 4.0–10.5)

## 2014-11-19 LAB — BLOOD GAS, ARTERIAL
Acid-base deficit: 5.7 mmol/L — ABNORMAL HIGH (ref 0.0–2.0)
Bicarbonate: 19.3 mEq/L — ABNORMAL LOW (ref 20.0–24.0)
Drawn by: 41308
FIO2: 0.5
MECHVT: 600 mL
O2 Saturation: 93.2 %
PATIENT TEMPERATURE: 98.6
PCO2 ART: 38.7 mmHg (ref 35.0–45.0)
PEEP: 5 cmH2O
PO2 ART: 76 mmHg — AB (ref 80.0–100.0)
RATE: 28 resp/min
TCO2: 20.5 mmol/L (ref 0–100)
pH, Arterial: 7.319 — ABNORMAL LOW (ref 7.350–7.450)

## 2014-11-19 LAB — BASIC METABOLIC PANEL
Anion gap: 11 (ref 5–15)
BUN: 29 mg/dL — AB (ref 6–20)
CO2: 20 mmol/L — ABNORMAL LOW (ref 22–32)
CREATININE: 3.88 mg/dL — AB (ref 0.61–1.24)
Calcium: 5.6 mg/dL — CL (ref 8.9–10.3)
Chloride: 105 mmol/L (ref 101–111)
GFR, EST AFRICAN AMERICAN: 19 mL/min — AB (ref >60)
GFR, EST NON AFRICAN AMERICAN: 16 mL/min — AB (ref >60)
Glucose, Bld: 157 mg/dL — ABNORMAL HIGH (ref 65–99)
POTASSIUM: 4.8 mmol/L (ref 3.5–5.1)
SODIUM: 136 mmol/L (ref 135–145)

## 2014-11-19 LAB — GLUCOSE, CAPILLARY
GLUCOSE-CAPILLARY: 153 mg/dL — AB (ref 65–99)
GLUCOSE-CAPILLARY: 161 mg/dL — AB (ref 65–99)
GLUCOSE-CAPILLARY: 153 mg/dL — AB (ref 65–99)
Glucose-Capillary: 133 mg/dL — ABNORMAL HIGH (ref 65–99)
Glucose-Capillary: 141 mg/dL — ABNORMAL HIGH (ref 65–99)
Glucose-Capillary: 164 mg/dL — ABNORMAL HIGH (ref 65–99)

## 2014-11-19 LAB — HEMOGLOBIN A1C
Hgb A1c MFr Bld: 7.4 % — ABNORMAL HIGH (ref 4.8–5.6)
MEAN PLASMA GLUCOSE: 166 mg/dL

## 2014-11-19 LAB — ALBUMIN: ALBUMIN: 2.2 g/dL — AB (ref 3.5–5.0)

## 2014-11-19 LAB — C DIFFICILE QUICK SCREEN W PCR REFLEX
C DIFFICILE (CDIFF) INTERP: NEGATIVE
C DIFFICILE (CDIFF) TOXIN: NEGATIVE
C DIFFICLE (CDIFF) ANTIGEN: NEGATIVE

## 2014-11-19 LAB — PHOSPHORUS: Phosphorus: 4.9 mg/dL — ABNORMAL HIGH (ref 2.5–4.6)

## 2014-11-19 LAB — MAGNESIUM: Magnesium: 1.1 mg/dL — ABNORMAL LOW (ref 1.7–2.4)

## 2014-11-19 MED ORDER — VASOPRESSIN 20 UNIT/ML IV SOLN
0.0300 [IU]/min | INTRAVENOUS | Status: DC
  Administered 2014-11-19 (×2): 0.03 [IU]/min via INTRAVENOUS
  Filled 2014-11-19 (×3): qty 2

## 2014-11-19 MED ORDER — MAGNESIUM SULFATE 4 GM/100ML IV SOLN
4.0000 g | Freq: Once | INTRAVENOUS | Status: AC
  Administered 2014-11-19: 4 g via INTRAVENOUS
  Filled 2014-11-19: qty 100

## 2014-11-19 MED ORDER — PRO-STAT SUGAR FREE PO LIQD
60.0000 mL | Freq: Two times a day (BID) | ORAL | Status: DC
  Administered 2014-11-19 – 2014-11-21 (×5): 60 mL
  Filled 2014-11-19 (×6): qty 60

## 2014-11-19 MED ORDER — MIDAZOLAM HCL 2 MG/2ML IJ SOLN
INTRAMUSCULAR | Status: AC
  Administered 2014-11-19: 2 mg via INTRAVENOUS
  Filled 2014-11-19: qty 4

## 2014-11-19 MED ORDER — MIDAZOLAM HCL 2 MG/2ML IJ SOLN
1.0000 mg | INTRAMUSCULAR | Status: DC | PRN
  Administered 2014-11-19 – 2014-11-22 (×9): 2 mg via INTRAVENOUS
  Filled 2014-11-19 (×8): qty 2

## 2014-11-19 MED ORDER — CEFAZOLIN SODIUM-DEXTROSE 2-3 GM-% IV SOLR
2.0000 g | Freq: Two times a day (BID) | INTRAVENOUS | Status: DC
  Administered 2014-11-19: 2 g via INTRAVENOUS
  Filled 2014-11-19 (×2): qty 50

## 2014-11-19 MED ORDER — TIZANIDINE HCL 4 MG PO TABS
2.0000 mg | ORAL_TABLET | Freq: Three times a day (TID) | ORAL | Status: DC
  Administered 2014-11-20 – 2014-11-24 (×12): 2 mg via ORAL
  Filled 2014-11-19 (×14): qty 1

## 2014-11-19 MED ORDER — VITAL HIGH PROTEIN PO LIQD
1000.0000 mL | ORAL | Status: DC
  Administered 2014-11-19 – 2014-11-20 (×2): 1000 mL
  Administered 2014-11-20: 03:00:00
  Administered 2014-11-21: 1000 mL
  Filled 2014-11-19 (×7): qty 1000

## 2014-11-19 MED ORDER — PHENYLEPHRINE HCL 10 MG/ML IJ SOLN
30.0000 ug/min | INTRAVENOUS | Status: DC
  Administered 2014-11-19: 100 ug/min via INTRAVENOUS
  Administered 2014-11-19: 175 ug/min via INTRAVENOUS
  Administered 2014-11-20: 105 ug/min via INTRAVENOUS
  Administered 2014-11-21: 45 ug/min via INTRAVENOUS
  Administered 2014-11-21: 100 ug/min via INTRAVENOUS
  Filled 2014-11-19 (×9): qty 4

## 2014-11-19 NOTE — Progress Notes (Signed)
[facility] Progress Note Patient Name: Phillip Rocha DOB: 10-13-1960 MRN: [id number]   Date of Service  11/19/2014  HPI/Events of Note  Labile bp-  eICU Interventions  Will start vasopressin        Flora Lipps 11/19/2014, 3:37 AM

## 2014-11-19 NOTE — Progress Notes (Signed)
PULMONARY / CRITICAL CARE MEDICINE   Name: Phillip Rocha MRN: [id number] DOB: [birth year]    ADMISSION DATE:  11/17/2014 CONSULTATION DATE:  11/18/2014  REFERRING MD :  Dr. Louanne Skye  CHIEF COMPLAINT:  Back pain  INITIAL PRESENTATION:  54 yo male with back pain presented for re-do of L5-S1 fusion.  Remained on vent post-op.    STUDIES:   SIGNIFICANT EVENTS: 10/4 Redo transforaminal lumbar interbody fusions L3-4, L4-5, L5-S1, Revision of hardware L3 to S1, L3 to S1 Posterolateral fusion, Iliac wing fixation screws, L5-S1 Cage, Viviven, cancellous bone chips, rods and screws; EBL 3800 ml.  His surgery was about 12 hours with him in prone position.   HISTORY OF PRESENT ILLNESS:   Hx from Chart.  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  He had EBL 3800 ml.  He remained on vent post op.  SUBJECTIVE: Required neo overnight, suspect related to sedation.  VITAL SIGNS: Temp:  [98.6 F (37 C)-99.9 F (37.7 C)] 98.9 F (37.2 C) (10/06 0400) Pulse Rate:  [87-114] 87 (10/06 0800) Resp:  [9-34] 21 (10/06 0800) BP: (60-125)/(15-73) 103/62 mmHg (10/06 0800) SpO2:  [91 %-99 %] 93 % (10/06 0800) FiO2 (%):  [35 %-50 %] 50 % (10/06 0600) VENTILATOR SETTINGS: Vent Mode:  [-] PRVC FiO2 (%):  [35 %-50 %] 50 % Set Rate:  [24 bmp-28 bmp] 28 bmp Vt Set:  [600 mL] 600 mL PEEP:  [5 cmH20-8 cmH20] 5 cmH20 Plateau Pressure:  [15 cmH20-27 cmH20] 22 cmH20 INTAKE / OUTPUT:  Intake/Output Summary (Last 24 hours) at 11/19/14 0737 Last data filed at 11/19/14 0700  Gross per 24 hour  Intake 4515.05 ml  Output    563 ml  Net 3952.05 ml    PHYSICAL EXAMINATION: General: in ICU room, agitated on and off sedation. Neuro:  RASS -1, follows commands, moves all ext spontaneously. HEENT:  Facial edema improving, ETT in place. Cardiovascular:  Regular, tachycardic when agitated. Lungs:  Coarse BS diffusely. Abdomen:  Obese, soft, NT, ND and +BS. Musculoskeletal:  1+  edema. Skin:  Extensive changes from psoriasis diffusely specially lower ext noted.  LABS:  CBC  Recent Labs Lab 11/18/14 0701 11/18/14 2135 11/19/14 0234  WBC 21.2* 31.0* 35.0*  HGB 9.8* 7.9* 8.0*  HCT 31.9* 25.8* 26.0*  PLT 160 147* 162   Coag's  Recent Labs Lab 11/18/14 0701  APTT 28  INR 1.16   BMET  Recent Labs Lab 11/17/14 2207 11/18/14 0701 11/19/14 0234  NA 136 137 136  K 4.9 5.2* 4.8  CL 100* 103 105  CO2 23 22 20*  BUN 13 18 29*  CREATININE 1.56* 2.64* 3.88*  GLUCOSE 196* 145* 157*   Electrolytes  Recent Labs Lab 11/17/14 2207 11/18/14 0701 11/19/14 0234  CALCIUM 7.3* 6.8* 5.6*  MG  --   --  1.1*  PHOS  --   --  4.9*   Sepsis Markers No results for input(s): LATICACIDVEN, PROCALCITON, O2SATVEN in the last 168 hours.   ABG  Recent Labs Lab 11/18/14 0419 11/18/14 1540 11/19/14 0330  PHART 7.297* 7.326* 7.319*  PCO2ART 48.4* 40.9 38.7  PO2ART 74.0* 60.5* 76.0*   Liver Enzymes  Recent Labs Lab 11/19/14 0234  ALBUMIN 2.2*    Cardiac Enzymes  Recent Labs Lab 11/17/14 2207  TROPONINI <0.03   Glucose  Recent Labs Lab 11/18/14 1127 11/18/14 1549 11/18/14 1933 11/18/14 2339 11/19/14 0329 11/19/14 0755  GLUCAP 137* 142* 157* 133*  141* 153*    Imaging Dg Chest Port 1 View  11/19/2014   CLINICAL DATA:  Hypoxia, respiratory failure, intubated patient.  EXAM: PORTABLE CHEST 1 VIEW  COMPARISON:  Portable chest x-ray of November 18, 2014  FINDINGS: The lungs are slightly less well inflated today. Pulmonary interstitial and alveolar opacities are more conspicuous bilaterally. The hemidiaphragms are largely obscured. The cardiac silhouette remains enlarged and the pulmonary vascularity engorged. The endotracheal tube tip lies 3.3 cm above the carina.  IMPRESSION: Interval worsening of bilateral interstitial and alveolar opacities. There is pulmonary vascular congestion and cardiomegaly.   Electronically Signed   By: David  Martinique M.D.    On: 11/19/2014 07:43   Dg Chest Port 1 View  11/18/2014   CLINICAL DATA:  Hypoxia  EXAM: PORTABLE CHEST 1 VIEW  COMPARISON:  Study obtained earlier in the day  FINDINGS: Endotracheal tube tip is 9.8 cm above the carina with the tip at the level of T1-2. No pneumothorax. There is atelectatic change with small effusion at the right base. The lungs elsewhere clear. Heart is enlarged with pulmonary vascularity within normal limits. No adenopathy.  IMPRESSION: No tracheal tube is more superiorly located compared to earlier in the day with the tip 9.8 cm above the carina. No pneumothorax. There is a small right pleural effusion with right base atelectasis. Lungs elsewhere clear. Heart is prominent but stable.   Electronically Signed   By: Lowella Grip III M.D.   On: 11/18/2014 17:20   ASSESSMENT / PLAN:  PULMONARY ETT 10/4 >> A: Acute respiratory failure with hypoxia/hypercapnia. Hx of allergic rhinitis. P:   Hold diureses given renal function and hemodynamics. FiO2 down to 40% at this point. Place back to full support. F/u CXR, ABG in AM. Hold outpt qnasl, dymista, allegra, singulair for now.  CARDIOVASCULAR A:  Hypotension 2nd to hypovolemia, and blood loss. Hx of HTN. P:  IVF resuscitation. Place TLC. Check CVP. Hold outpt norvasc, cozaar. Check echo for EF and PAP.  RENAL A:   AKI from volume loss >> Baseline Creatinine 0.77 from 11/09/14. Hyperkalemia resolved. Renal function deteriorating. P:   Optimize hemodynamics as able. Monitor renal fx, urine outpt, electrolytes. Replace electrolytes as indicated. BMET in AM.  GASTROINTESTINAL A:   Nutrition. Morbid obesity. Hx of GERD. P:   Protonix (uses Dexilant as outpt). Start TF per nutrition.  HEMATOLOGIC A:   Anemia related to blood loss from surgery. Leukocytosis 2nd to acute stress after surgery. P:  F/u CBC SCD's Add SQ heparin when okay with ortho  INFECTIOUS A:   Post-op prophylaxis. P:   Ancef  per ortho.  ENDOCRINE A:   DM type II.   Hx of Gout. P:   SSI Allopurinol, glucophage  NEUROLOGIC A:   Sedation. Hx of anxiety. P:   RASS goal: -1 Hold outpt zoloft, zanaflex, ambien, oxycodone, MS contin. D/C propofol. Versed PRN. Fentanyl drip.  ORTHOPEDICS A: S/p Re-do lumbar spine. P: Post op care per ortho.  The patient is critically ill with multiple organ systems failure and requires high complexity decision making for assessment and support, frequent evaluation and titration of therapies, application of advanced monitoring technologies and extensive interpretation of multiple databases.   Critical Care Time devoted to patient care services described in this note is  35  Minutes. This time reflects time of care of this signee Dr Jennet Maduro. This critical care time does not reflect procedure time, or teaching time or supervisory time of PA/NP/Med student/Med Resident etc  but could involve care discussion time.  Rush Farmer, M.D. [facility] Pulmonary/Critical Care Medicine. Pager: [phone]. After hours pager: [phone].

## 2014-11-19 NOTE — Procedures (Addendum)
Central Venous Catheter Insertion Procedure Note Phillip Rocha [id number] 22-Jun-1960  Procedure: Insertion of Central Venous Catheter Indications: Assessment of intravascular volume, Drug and/or fluid administration and Frequent blood sampling  Procedure Details Consent: Risks of procedure as well as the alternatives and risks of each were explained to the (patient/caregiver).  Consent for procedure obtained. Time Out: Verified patient identification, verified procedure, site/side was marked, verified correct patient position, special equipment/implants available, medications/allergies/relevent history reviewed, required imaging and test results available.  Performed  Maximum sterile technique was used including antiseptics, cap, gloves, gown, hand hygiene, mask and sheet. Skin prep: Chlorhexidine; local anesthetic administered A antimicrobial bonded/coated triple lumen catheter was placed in the left internal jugular vein using the Seldinger technique. Ultrasound guidance used.Yes.   Catheter placed to 20 cm. Blood aspirated via all 3 ports and then flushed x 3. Line sutured x 2 and dressing applied.  Evaluation Blood flow good Complications: No apparent complications Patient did tolerate procedure well. Chest X-ray ordered to verify placement.  CXR: pending.  Richardson Landry Minor ACNP Maryanna Shape PCCM Pager [phone] till 3 pm If no answer page [phone] 11/19/2014, 9:52 AM  U/S used in placement.   I was present and supervised then entire procedure.  Rush Farmer, M.D. [facility] Pulmonary/Critical Care Medicine. Pager: [phone]. After hours pager: [phone].

## 2014-11-19 NOTE — Clinical Social Work Note (Signed)
Clinical Social Worker received standing order referral for possible ST-SNF placement.  Chart reviewed.  PT/OT evaluations not yet completed due to continued intubation.    CSW signing off - please re consult if social work needs arise.  Barbette Or, [place]

## 2014-11-19 NOTE — Progress Notes (Signed)
Subjective: 2 Days Post-Op Procedure(s) (LRB): Redo transforaminal  lumbar interbody fusions L3-4, L4-5, L5-S1, Revision of hardware L3 to S1, L3 to S1 Posterolateral fusion, Iliac wing fixation screws, L5-S1 Cage, Viviven, cancellous bone chips, rods and screws (N/A)Awake, alert and oriented x3. Ventilator but responding to commands, able to lift head off bed and move extremities When asked. Drainage from incision today, has stapled incision so some drainage to be expected post drain discontinued on 10/5.   Patient reports pain as marked.  Receiving fentanyl IV.  Objective:   VITALS:  Temp:  [98.9 F (37.2 C)-100.6 F (38.1 C)] 100.6 F (38.1 C) (10/06 1623) Pulse Rate:  [84-111] 89 (10/06 1934) Resp:  [6-28] 28 (10/06 1934) BP: (60-159)/(15-68) 101/42 mmHg (10/06 1934) SpO2:  [83 %-98 %] 96 % (10/06 1934) FiO2 (%):  [50 %-70 %] 70 % (10/06 1934)  Neurologically intact ABD soft Neurovascular intact Sensation intact distally Intact pulses distally Dorsiflexion/Plantar flexion intact Incision: saturating dressings, has extremely large deep space, should keep dry dressings. No cellulitis present Compartment soft sangineous drainage. Abdomen is distended and tympanic, decreased BS likely ileus.   LABS  Recent Labs  11/17/14 1919  11/17/14 2207 11/18/14 0701 11/18/14 2135 11/19/14 0234  HGB 11.6*  --  11.0* 9.8* 7.9* 8.0*  WBC  --   < > 25.0* 21.2* 31.0* 35.0*  PLT  --   < > 192 160 147* 162  < > = values in this interval not displayed.  Recent Labs  11/18/14 0701 11/19/14 0234  NA 137 136  K 5.2* 4.8  CL 103 105  CO2 22 20*  BUN 18 29*  CREATININE 2.64* 3.88*  GLUCOSE 145* 157*    Recent Labs  11/18/14 0701  INR 1.16     Assessment/Plan: 2 Days Post-Op Procedure(s) (LRB): Redo transforaminal  lumbar interbody fusions L3-4, L4-5, L5-S1, Revision of hardware L3 to S1, L3 to S1 Posterolateral fusion, Iliac wing fixation screws, L5-S1 Cage, Viviven,  cancellous bone chips, rods and screws (N/A)  Acute Renal insufficiency, likely ATN perioperative. Anemia perioperative blood loss. Respiratory insufficiency, multifactoral, size, prolong anesthesia. He is awake and saturation is good on Peep 5 FiO2 50% Diabetes. Rule out adrenal crisis.  Continue ABX therapy due to incision drainage. 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 May account for decrease BP and leukocytosis of high level.  NITKA,JAMES E 11/19/2014, 9:36 PM

## 2014-11-19 NOTE — Progress Notes (Signed)
PT Cancellation Note  Patient Details Name: JOZEPH PERSING MRN: [id number] DOB: [birth year]   Cancelled Treatment:    Reason Eval/Treat Not Completed: Medical issues which prohibited therapy.  Pt still intubated and not ready.  Will eval when cleared and able. 11/19/2014  Donnella Sham, [place] [phone]  (pager)   Vinia Jemmott, Tessie Fass 11/19/2014, 9:54 AM

## 2014-11-19 NOTE — Progress Notes (Signed)
Initial Nutrition Assessment  DOCUMENTATION CODES:   Morbid obesity  INTERVENTION:   Initiate Vital High Protein @ 25 ml/hr via OG tube and increase by 10 ml every 2 hours to goal rate of 65 ml/hr.   60 ml Prostat BID.    Tube feeding regimen provides 1960 kcal, 196 grams of protein, and 1304 ml of H2O.   NUTRITION DIAGNOSIS:   Inadequate oral intake related to inability to eat as evidenced by NPO status.  GOAL:   Provide needs based on ASPEN/SCCM guidelines  MONITOR:   TF tolerance, Vent status, Labs, I & O's  REASON FOR ASSESSMENT:   Consult Enteral/tube feeding initiation and management  ASSESSMENT:   54 yo male with back pain presented for re-do of L5-S1 fusion. Remained on vent post-op.   Patient is currently intubated on ventilator support MV: 16.5 L/min Temp (24hrs), Avg:99.4 F (37.4 C), Min:98.9 F (37.2 C), Max:99.9 F (37.7 C)   Medications reviewed and include: magnesium sulfate, vitamin B12, Vitamin C Labs reviewed: BUN/Cr/Phosphorus elevated, magnesium low   Diet Order:  Diet NPO time specified  Skin:  Reviewed, no issues  Last BM:  10/6 via rectal pouch (10/6)  Height:   Ht Readings from Last 1 Encounters:  11/17/14 5' 11" (1.803 m)   Weight:   Wt Readings from Last 1 Encounters:  11/17/14 385 lb (174.635 kg)   Ideal Body Weight:  78.1 kg  BMI:  Body mass index is 53.72 kg/(m^2).  Estimated Nutritional Needs:   Kcal:  9371-6967  Protein:  >/= 195 grams  Fluid:  > 1.7 L/day  EDUCATION NEEDS:   No education needs identified at this time  [place], [place], [place] Pager [phone] After Hours Pager

## 2014-11-19 NOTE — Progress Notes (Addendum)
ANTIBIOTIC & Renal Adjust Medications CONSULT NOTE - INITIAL  Pharmacy Consult for Ancef Indication: Concern of incision infection post-operatively  No Known Allergies Patient Measurements: Height: 5' 11" (180.3 cm) Weight: (!) 385 lb (174.635 kg) IBW/kg (Calculated) : 75.3 Vital Signs: Temp: 100.6 F (38.1 C) (10/06 1623) Temp Source: Axillary (10/06 1623) BP: 96/40 mmHg (10/06 2100) Pulse Rate: 95 (10/06 2100) Intake/Output from previous day: 10/05 0701 - 10/06 0700 In: 4630.6 [I.V.:3630.6; IV Piggyback:1000] Out: 563 [Urine:563] Intake/Output from this shift: Total I/O In: 258.9 [I.V.:258.9] Out: -  Labs:  Recent Labs  11/17/14 2207 11/18/14 0701 11/18/14 2135 11/19/14 0234  WBC 25.0* 21.2* 31.0* 35.0*  HGB 11.0* 9.8* 7.9* 8.0*  PLT 192 160 147* 162  CREATININE 1.56* 2.64*  --  3.88*   Estimated Creatinine Clearance: 34.6 mL/min (by C-G formula based on Cr of 3.88). No results for input(s): VANCOTROUGH, VANCOPEAK, VANCORANDOM, GENTTROUGH, GENTPEAK, GENTRANDOM, TOBRATROUGH, TOBRAPEAK, TOBRARND, AMIKACINPEAK, AMIKACINTROU, AMIKACIN in the last 72 hours.   Microbiology: Recent Results (from the past 720 hour(s))  Surgical pcr screen     Status: None   Collection Time: 11/09/14  9:11 AM  Result Value Ref Range Status   MRSA, PCR NEGATIVE NEGATIVE Final   Staphylococcus aureus NEGATIVE NEGATIVE Final    Comment:        The Xpert SA Assay (FDA approved for NASAL specimens in patients over 17 years of age), is one component of a comprehensive surveillance program.  Test performance has been validated by [facility] for patients greater than or equal to 64 year old. It is not intended to diagnose infection nor to guide or monitor treatment.   C difficile quick scan w PCR reflex     Status: None   Collection Time: 11/19/14 12:24 AM  Result Value Ref Range Status   C Diff antigen NEGATIVE NEGATIVE Final   C Diff toxin NEGATIVE NEGATIVE Final   C Diff  interpretation Negative for toxigenic C. difficile  Final    Medical History: Past Medical History  Diagnosis Date  . Hypertension   . Anxiety   . GERD (gastroesophageal reflux disease)   . Arthritis   . Psoriatic arthritis ([place])     takes Humira every 2 weeks  . Diabetes mellitus without complication (HCC)     NIDDM x 2 years  . Degenerative arthritis     knees and spine  . Psoriasis   . Morbid obesity ([place])   . Claustrophobia   . Anemia   . Lymphadenopathy 08/25/2014  . Shortness of breath dyspnea   . Cough with sputum     yellow color  . Headache     Medications:  Anti-infectives    Start     Dose/Rate Route Frequency Ordered Stop   11/18/14 0000  ceFAZolin (ANCEF) IVPB 1 g/50 mL premix     1 g 100 mL/hr over 30 Minutes Intravenous Every 8 hours 11/17/14 2020 11/18/14 0822   11/17/14 1900  vancomycin (VANCOCIN) powder 1,000 mg  Status:  Discontinued     1,000 mg Other To Surgery 11/17/14 1847 11/17/14 2020   11/17/14 1854  vancomycin (VANCOCIN) powder  Status:  Discontinued       As needed 11/17/14 1855 11/17/14 1958   11/17/14 1100  ceFAZolin (ANCEF) 3 g in dextrose 5 % 50 mL IVPB  Status:  Discontinued     3 g 160 mL/hr over 30 Minutes Intravenous To ShortStay Surgical 11/17/14 1049 11/17/14 2011   11/17/14 0715  ceFAZolin (ANCEF) 3 g in dextrose 5 % 50 mL IVPB     3 g 160 mL/hr over 30 Minutes Intravenous To ShortStay Surgical 11/16/14 1327 11/17/14 1606     Assessment: 54 year old male s/p lumbar fusions with persistent incisional drainage to restart Ancef per pharmacy dosing. Kidney function is worsening (creatinine has more than doubled in the last 2 days) with current CrCl ~ 25-35 mL/min. Last dose of Ancef was on 10/5 at 8AM.    *Renal medication adjustment - Zanaflex (tizanidine) is recommended to be used with caution in renal failure as clearance is reduced >50% however no specific dosing recommendations are provided in the manufacturer's labeling. Will  reduce until renal function improves.   Goal of Therapy:  Clinical resolution of infection  Plan:  Ancef 2g IV q12h. Monitor renal function and need to adjust  Decrease tizanidine to 52m po TID.  *Robaxin IV should be avoided in renal failure due to risk of propylene glycol toxicity. Held this medication as patient has tablet available and per tube access.  All other doses are appropriate.   JSloan Leiter PharmD, BCPS Clinical Pharmacist [phone]/07/2014,10:04 PM

## 2014-11-19 NOTE — Progress Notes (Signed)
CRITICAL VALUE ALERT  Critical value received: Ca++ 5.6   Date of notification:  11/19/2014    Time of notification:  5916  Critical value read back:Yes.    Nurse who received alert:  Beacher May, RN   MD notified (1st page):  Dr. Corbin Ade Warren Lacy)  Time of first page:  [phone]  MD notified (2nd page):  Time of second page:  Responding MD:  Mortimer Fries  Time MD responded:  [email or web address]  MD aware and checking corrected level.

## 2014-11-19 NOTE — Progress Notes (Signed)
OT Cancellation Note  Patient Details Name: JASH WAHLEN MRN: [id number] DOB: 1960-04-03   Cancelled Treatment:    Reason Eval/Treat Not Completed: Patient not medically ready Continues intubation. Will assess when medically stable.  [place], OTR/L  [phone] 11/19/2014 11/19/2014, 12:11 PM

## 2014-11-20 ENCOUNTER — Inpatient Hospital Stay (HOSPITAL_COMMUNITY): Payer: Medicare HMO

## 2014-11-20 DIAGNOSIS — J80 Acute respiratory distress syndrome: Secondary | ICD-10-CM

## 2014-11-20 DIAGNOSIS — R079 Chest pain, unspecified: Secondary | ICD-10-CM

## 2014-11-20 DIAGNOSIS — J9600 Acute respiratory failure, unspecified whether with hypoxia or hypercapnia: Secondary | ICD-10-CM | POA: Insufficient documentation

## 2014-11-20 LAB — URINALYSIS, ROUTINE W REFLEX MICROSCOPIC
Bilirubin Urine: NEGATIVE
GLUCOSE, UA: NEGATIVE mg/dL
KETONES UR: NEGATIVE mg/dL
NITRITE: NEGATIVE
PH: 5.5 (ref 5.0–8.0)
PROTEIN: 30 mg/dL — AB
Specific Gravity, Urine: 1.02 (ref 1.005–1.030)
Urobilinogen, UA: 0.2 mg/dL (ref 0.0–1.0)

## 2014-11-20 LAB — BASIC METABOLIC PANEL
ANION GAP: 12 (ref 5–15)
BUN: 43 mg/dL — ABNORMAL HIGH (ref 6–20)
CALCIUM: 5.5 mg/dL — AB (ref 8.9–10.3)
CO2: 23 mmol/L (ref 22–32)
Chloride: 103 mmol/L (ref 101–111)
Creatinine, Ser: 2.56 mg/dL — ABNORMAL HIGH (ref 0.61–1.24)
GFR, EST AFRICAN AMERICAN: 31 mL/min — AB (ref >60)
GFR, EST NON AFRICAN AMERICAN: 26 mL/min — AB (ref >60)
Glucose, Bld: 165 mg/dL — ABNORMAL HIGH (ref 65–99)
Potassium: 4.2 mmol/L (ref 3.5–5.1)
Sodium: 138 mmol/L (ref 135–145)

## 2014-11-20 LAB — CORTISOL-AM, BLOOD: CORTISOL - AM: 19.2 ug/dL (ref 6.7–22.6)

## 2014-11-20 LAB — URINE MICROSCOPIC-ADD ON

## 2014-11-20 LAB — BLOOD GAS, ARTERIAL
Acid-base deficit: 4.4 mmol/L — ABNORMAL HIGH (ref 0.0–2.0)
Bicarbonate: 20.9 mEq/L (ref 20.0–24.0)
DRAWN BY: 41308
FIO2: 0.7
MECHVT: 600 mL
O2 Saturation: 84.4 %
PEEP: 5 cmH2O
Patient temperature: 98.6
RATE: 28 resp/min
TCO2: 22.2 mmol/L (ref 0–100)
pCO2 arterial: 43 mmHg (ref 35.0–45.0)
pH, Arterial: 7.307 — ABNORMAL LOW (ref 7.350–7.450)
pO2, Arterial: 54.3 mmHg — ABNORMAL LOW (ref 80.0–100.0)

## 2014-11-20 LAB — GLUCOSE, CAPILLARY
GLUCOSE-CAPILLARY: 158 mg/dL — AB (ref 65–99)
GLUCOSE-CAPILLARY: 164 mg/dL — AB (ref 65–99)
GLUCOSE-CAPILLARY: 172 mg/dL — AB (ref 65–99)
GLUCOSE-CAPILLARY: 174 mg/dL — AB (ref 65–99)
GLUCOSE-CAPILLARY: 184 mg/dL — AB (ref 65–99)
GLUCOSE-CAPILLARY: 212 mg/dL — AB (ref 65–99)
Glucose-Capillary: 110 mg/dL — ABNORMAL HIGH (ref 65–99)
Glucose-Capillary: 160 mg/dL — ABNORMAL HIGH (ref 65–99)

## 2014-11-20 LAB — CBC
HEMATOCRIT: 22.6 % — AB (ref 39.0–52.0)
Hemoglobin: 6.7 g/dL — CL (ref 13.0–17.0)
MCH: 22.3 pg — ABNORMAL LOW (ref 26.0–34.0)
MCHC: 29.6 g/dL — ABNORMAL LOW (ref 30.0–36.0)
MCV: 75.3 fL — ABNORMAL LOW (ref 78.0–100.0)
PLATELETS: 154 10*3/uL (ref 150–400)
RBC: 3 MIL/uL — AB (ref 4.22–5.81)
RDW: 17.6 % — AB (ref 11.5–15.5)
WBC: 23.6 10*3/uL — AB (ref 4.0–10.5)

## 2014-11-20 LAB — TRIGLYCERIDES: TRIGLYCERIDES: 262 mg/dL — AB (ref <150)

## 2014-11-20 LAB — PHOSPHORUS: PHOSPHORUS: 3.4 mg/dL (ref 2.5–4.6)

## 2014-11-20 LAB — PREPARE RBC (CROSSMATCH)

## 2014-11-20 LAB — MAGNESIUM: Magnesium: 1.8 mg/dL (ref 1.7–2.4)

## 2014-11-20 MED ORDER — PERFLUTREN LIPID MICROSPHERE
2.0000 mL | INTRAVENOUS | Status: AC | PRN
  Administered 2014-11-20: 2 mL via INTRAVENOUS
  Filled 2014-11-20: qty 10

## 2014-11-20 MED ORDER — HYDROCORTISONE NA SUCCINATE PF 100 MG IJ SOLR
50.0000 mg | Freq: Four times a day (QID) | INTRAMUSCULAR | Status: DC
  Administered 2014-11-20 – 2014-11-23 (×12): 50 mg via INTRAVENOUS
  Filled 2014-11-20 (×2): qty 2
  Filled 2014-11-20 (×4): qty 1
  Filled 2014-11-20 (×2): qty 2
  Filled 2014-11-20 (×3): qty 1
  Filled 2014-11-20 (×3): qty 2
  Filled 2014-11-20: qty 1
  Filled 2014-11-20: qty 2
  Filled 2014-11-20: qty 1
  Filled 2014-11-20: qty 2
  Filled 2014-11-20: qty 1

## 2014-11-20 MED ORDER — SODIUM CHLORIDE 0.9 % IV SOLN
1750.0000 mg | INTRAVENOUS | Status: DC
  Administered 2014-11-21 – 2014-11-23 (×3): 1750 mg via INTRAVENOUS
  Filled 2014-11-20 (×3): qty 1750

## 2014-11-20 MED ORDER — FUROSEMIDE 10 MG/ML IJ SOLN
40.0000 mg | Freq: Four times a day (QID) | INTRAMUSCULAR | Status: AC
  Administered 2014-11-20 (×3): 40 mg via INTRAVENOUS
  Filled 2014-11-20 (×3): qty 4

## 2014-11-20 MED ORDER — MIDAZOLAM HCL 2 MG/2ML IJ SOLN
INTRAMUSCULAR | Status: AC
  Administered 2014-11-20: 2 mg via INTRAVENOUS
  Filled 2014-11-20: qty 2

## 2014-11-20 MED ORDER — PIPERACILLIN-TAZOBACTAM 3.375 G IVPB
3.3750 g | Freq: Three times a day (TID) | INTRAVENOUS | Status: DC
Start: 2014-11-20 — End: 2014-11-23
  Administered 2014-11-20 – 2014-11-23 (×9): 3.375 g via INTRAVENOUS
  Filled 2014-11-20 (×13): qty 50

## 2014-11-20 MED ORDER — SODIUM CHLORIDE 0.9 % IV SOLN
Freq: Once | INTRAVENOUS | Status: DC
  Administered 2014-11-24: 09:00:00 via INTRAVENOUS

## 2014-11-20 MED ORDER — SODIUM CHLORIDE 0.9 % IV SOLN
1.0000 mg/h | INTRAVENOUS | Status: DC
  Administered 2014-11-20 – 2014-11-21 (×2): 2 mg/h via INTRAVENOUS
  Filled 2014-11-20 (×3): qty 10

## 2014-11-20 MED ORDER — MAGNESIUM SULFATE 2 GM/50ML IV SOLN
2.0000 g | Freq: Once | INTRAVENOUS | Status: AC
  Administered 2014-11-20: 2 g via INTRAVENOUS
  Filled 2014-11-20: qty 50

## 2014-11-20 MED ORDER — CEFAZOLIN SODIUM-DEXTROSE 2-3 GM-% IV SOLR
2.0000 g | Freq: Three times a day (TID) | INTRAVENOUS | Status: DC
  Administered 2014-11-20: 2 g via INTRAVENOUS
  Filled 2014-11-20 (×2): qty 50

## 2014-11-20 MED ORDER — SODIUM CHLORIDE 0.9 % IV SOLN
2000.0000 mg | Freq: Once | INTRAVENOUS | Status: AC
  Administered 2014-11-20: 2000 mg via INTRAVENOUS
  Filled 2014-11-20: qty 2000

## 2014-11-20 NOTE — Progress Notes (Signed)
AM Cortisol level 19 WNL Spoke with Wound care  And she will initiate use of incisional VAC May have an autogenous unit of blood available if current Volume allows, I will leave decision to Intensivist.. My PA will see today.

## 2014-11-20 NOTE — Progress Notes (Signed)
PT Cancellation Note  Patient Details Name: ALMON WHITFORD MRN: [id number] DOB: Dec 31, 1960   Cancelled Treatment:    Reason Eval/Treat Not Completed: Medical issues which prohibited therapy.  Pt still intubated and not likely to be ready this weekend.  Will sign off and ask for a reorder. 11/20/2014  Donnella Sham, [place] [phone]  (pager)  Jacquilyn Seldon, Tessie Fass 11/20/2014, 10:29 AM

## 2014-11-20 NOTE — Progress Notes (Signed)
PULMONARY / CRITICAL CARE MEDICINE   Name: Phillip Rocha MRN: [id number] DOB: 11/26/1960    ADMISSION DATE:  11/17/2014 CONSULTATION DATE:  11/18/2014  REFERRING MD :  Dr. Louanne Skye  CHIEF COMPLAINT:  Back pain  INITIAL PRESENTATION:  54 yo male with back pain presented for re-do of L5-S1 fusion.  Remained on vent post-op.    STUDIES:   SIGNIFICANT EVENTS: 10/4 Redo transforaminal lumbar interbody fusions L3-4, L4-5, L5-S1, Revision of hardware L3 to S1, L3 to S1 Posterolateral fusion, Iliac wing fixation screws, L5-S1 Cage, Viviven, cancellous bone chips, rods and screws; EBL 3800 ml.  His surgery was about 12 hours with him in prone position.   HISTORY OF PRESENT ILLNESS:   Hx from Chart.  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  He had EBL 3800 ml.  He remained on vent post op.  SUBJECTIVE: Required neo overnight, suspect related to sedation.  VITAL SIGNS: Temp:  [99.8 F (37.7 C)-101.8 F (38.8 C)] 101 F (38.3 C) (10/07 0800) Pulse Rate:  [83-103] 85 (10/07 0819) Resp:  [0-29] 28 (10/07 0819) BP: (89-131)/(36-61) 115/46 mmHg (10/07 0819) SpO2:  [83 %-99 %] 96 % (10/07 0819) FiO2 (%):  [50 %-100 %] 100 % (10/07 0819) Weight:  [176.449 kg (389 lb)] 176.449 kg (389 lb) (10/07 0300) VENTILATOR SETTINGS: Vent Mode:  [-] PRVC FiO2 (%):  [50 %-100 %] 100 % Set Rate:  [28 bmp] 28 bmp Vt Set:  [600 mL] 600 mL PEEP:  [5 cmH20] 5 cmH20 Plateau Pressure:  [21 cmH20-27 cmH20] 21 cmH20 INTAKE / OUTPUT:  Intake/Output Summary (Last 24 hours) at 11/20/14 0926 Last data filed at 11/20/14 0900  Gross per 24 hour  Intake 2816.32 ml  Output   1355 ml  Net 1461.32 ml   PHYSICAL EXAMINATION: General: In ICU room, relaxed. Neuro:  RASS -1, follows commands, moves all ext spontaneously. HEENT:  Facial edema resolved, ETT in place. Cardiovascular:  Regular, tachycardic when agitated. Lungs:  Coarse BS diffusely. Abdomen:  Obese, soft, NT, ND  and +BS. Musculoskeletal:  1+ edema. Skin:  Extensive changes from psoriasis diffusely specially lower ext noted.  LABS:  CBC  Recent Labs Lab 11/18/14 2135 11/19/14 0234 11/20/14 0600  WBC 31.0* 35.0* 23.6*  HGB 7.9* 8.0* 6.7*  HCT 25.8* 26.0* 22.6*  PLT 147* 162 154   Coag's  Recent Labs Lab 11/18/14 0701  APTT 28  INR 1.16   BMET  Recent Labs Lab 11/18/14 0701 11/19/14 0234 11/20/14 0600  NA 137 136 138  K 5.2* 4.8 4.2  CL 103 105 103  CO2 22 20* 23  BUN 18 29* 43*  CREATININE 2.64* 3.88* 2.56*  GLUCOSE 145* 157* 165*   Electrolytes  Recent Labs Lab 11/18/14 0701 11/19/14 0234 11/20/14 0600  CALCIUM 6.8* 5.6* 5.5*  MG  --  1.1* 1.8  PHOS  --  4.9* 3.4   Sepsis Markers No results for input(s): LATICACIDVEN, PROCALCITON, O2SATVEN in the last 168 hours.   ABG  Recent Labs Lab 11/18/14 1540 11/19/14 0330 11/20/14 0345  PHART 7.326* 7.319* 7.307*  PCO2ART 40.9 38.7 43.0  PO2ART 60.5* 76.0* 54.3*   Liver Enzymes  Recent Labs Lab 11/19/14 0234  ALBUMIN 2.2*    Cardiac Enzymes  Recent Labs Lab 11/17/14 2207  TROPONINI <0.03   Glucose  Recent Labs Lab 11/19/14 2046 11/20/14 0024 11/20/14 0033 11/20/14 0412 11/20/14 0453 11/20/14 0814  GLUCAP 153* 164* 110* 174*  158* 160*   Imaging US Renal  11/20/2014   CLINICAL DATA:  Acute onset of renal failure.  Initial encounter.  EXAM: RENAL / URINARY TRACT ULTRASOUND COMPLETE  COMPARISON:  CT of the abdomen and pelvis from 08/18/2014, and renal ultrasound performed 03/13/2013  FINDINGS: Right Kidney:  Length: 10.3 cm. Poorly visualized, though echogenicity is grossly unremarkable. No mass or hydronephrosis visualized.  Left Kidney:  Length: 10.8 cm. Poorly visualized, though echogenicity is grossly unremarkable. No mass or hydronephrosis visualized.  Bladder:  Not characterized on this study.  IMPRESSION: Significantly suboptimal study due to the patient's habitus and limitations in  positioning. The kidneys are grossly unremarkable. No evidence of hydronephrosis.   Electronically Signed   By: Garald Balding M.D.   On: 11/20/2014 02:54   Dg Chest Port 1 View  11/20/2014   CLINICAL DATA:  Acute renal failure  EXAM: PORTABLE CHEST - 1 VIEW  COMPARISON:  11/19/2014  FINDINGS: Cardiac shadow remains enlarged. Endotracheal tube and nasogastric catheter as well as a left jugular line are again seen and stable. Persistent atelectatic changes are noted in the right lung base although improved aeration is noted. No other focal abnormality is seen.  IMPRESSION: Improved but persistent atelectatic changes in the right base.  The remainder of the exam is stable.   Electronically Signed   By: Inez Catalina M.D.   On: 11/20/2014 07:35   Dg Chest Port 1 View  11/19/2014   CLINICAL DATA:  Central line placement.  EXAM: PORTABLE CHEST 1 VIEW  COMPARISON:  Earlier film, same date.  FINDINGS: The endotracheal tube is stable. An NG tube is coursing down the esophagus. A left IJ central venous catheter tip is in the mid SVC probably adjacent to the lateral wall of the SVC. No complicating features. Persistent cardiac enlargement and diffuse airspace process.  IMPRESSION: Support apparatus as discussed above.  Persistent cardiac enlargement and diffuse airspace process.   Electronically Signed   By: Marijo Sanes M.D.   On: 11/19/2014 10:10   Dg Abd Portable 1v  11/19/2014   CLINICAL DATA:  NG tube placement.  EXAM: PORTABLE ABDOMEN - 1 VIEW  COMPARISON:  CT scan 08/18/2014.  FINDINGS: The NG tube tip is in the antropyloric region of the stomach. Mild gaseous distention of the transverse colon is noted. Bibasilar atelectasis.  IMPRESSION: NG tube tip is in the antropyloric region of the stomach.   Electronically Signed   By: Marijo Sanes M.D.   On: 11/19/2014 10:39   ASSESSMENT / PLAN:  PULMONARY ETT 10/4 >> A: Acute respiratory failure with hypoxia/hypercapnia. Hx of allergic rhinitis. P:    Increase PEEP to 14. FiO2 to 100%. Lasix 40 mg IV q6 x3 doses. F/u CXR, ABG in AM. Hold outpt qnasl, dymista, allegra, singulair for now.  CARDIOVASCULAR A:  Hypotension 2nd to hypovolemia, and blood loss. Hx of HTN. P:  KVO IVF Place TLC. CVP 26. Hold outpt norvasc, cozaar. Echo for EF and PAP. Stress dose steroids. Neo and vaso as ordered.  RENAL A:   AKI from volume loss >> Baseline Creatinine 0.77 from 11/09/14. Hyperkalemia resolved. Renal function deteriorating. P:   Optimize hemodynamics as able. Monitor renal fx, urine outpt, electrolytes. Replace electrolytes as indicated. BMET in AM. Lasix 40 mg IV q6 x3 doses.  GASTROINTESTINAL A:   Nutrition. Morbid obesity. Hx of GERD. P:   Protonix (uses Dexilant as outpt). Continue TF per nutrition.  HEMATOLOGIC A:   Anemia related to blood loss  from surgery, down to 6.7 bleeding from surgical site. Leukocytosis 2nd to acute stress after surgery. P:  F/u CBC. SCD's. Add SQ heparin when okay with ortho. Transfuse 2 units pRBC.  INFECTIOUS A:   Post-op prophylaxis. Fever overnight. P:   Ancef per ortho.  Blood 10/7>>> Sputum 10/7>>> Urine 10/7>>>  Vanc 10/7>>> Zosyn 10/7>>> Ancef 10/4>>>10/7  ENDOCRINE A:   DM type II.   Hx of Gout. P:   SSI Allopurinol, glucophage Stress dose steroids.  NEUROLOGIC A:   Sedation. Hx of anxiety. P:   RASS goal: -1 Hold outpt zoloft, zanaflex, ambien, oxycodone, MS contin. D/C propofol. Versed PRN. Fentanyl drip.  ORTHOPEDICS A: S/p Re-do lumbar spine. P: Post op care per ortho.  The patient is critically ill with multiple organ systems failure and requires high complexity decision making for assessment and support, frequent evaluation and titration of therapies, application of advanced monitoring technologies and extensive interpretation of multiple databases.   Critical Care Time devoted to patient care services described in this note is  35   Minutes. This time reflects time of care of this signee Dr Jennet Maduro. This critical care time does not reflect procedure time, or teaching time or supervisory time of PA/NP/Med student/Med Resident etc but could involve care discussion time.  Rush Farmer, M.D. [facility] Pulmonary/Critical Care Medicine. Pager: [phone]. After hours pager: [phone].

## 2014-11-20 NOTE — Progress Notes (Signed)
  Echocardiogram 2D Echocardiogram With Contrast has been performed.  Darlina Sicilian M 11/20/2014, 3:24 PM

## 2014-11-20 NOTE — Progress Notes (Signed)
Subjective: Ventilator.  Was just give versed due to wound vac application.  Being put on as I went in room.    Objective: Vital signs in last 24 hours: Temp:  [99.6 F (37.6 C)-101.8 F (38.8 C)] 99.6 F (37.6 C) (10/07 1015) Pulse Rate:  [82-103] 87 (10/07 1015) Resp:  [0-29] 28 (10/07 1015) BP: (89-131)/(36-61) 129/58 mmHg (10/07 1015) SpO2:  [89 %-99 %] 99 % (10/07 1015) FiO2 (%):  [50 %-100 %] 100 % (10/07 1015) Weight:  [176.449 kg (389 lb)] 176.449 kg (389 lb) (10/07 0300)  Intake/Output from previous day: 10/06 0701 - 10/07 0700 In: 2941.9 [I.V.:2551; NG/GT:250.8; IV Piggyback:50] Out: 1180 [Urine:1180] Intake/Output this shift: Total I/O In: -  Out: 350 [Urine:350]   Recent Labs  11/17/14 2207 11/18/14 0701 11/18/14 2135 11/19/14 0234 11/20/14 0600  HGB 11.0* 9.8* 7.9* 8.0* 6.7*    Recent Labs  11/19/14 0234 11/20/14 0600  WBC 35.0* 23.6*  RBC 3.48* 3.00*  HCT 26.0* 22.6*  PLT 162 154    Recent Labs  11/19/14 0234 11/20/14 0600  NA 136 138  K 4.8 4.2  CL 105 103  CO2 20* 23  BUN 29* 43*  CREATININE 3.88* 2.56*  GLUCOSE 157* 165*  CALCIUM 5.6* 5.5*    Recent Labs  11/18/14 0701  INR 1.16    Exam:  Patient comfortable.  Per wound care RN the incision looked good, staples intact but still continues to have a considerable amount of serosang drainage.    Assessment/Plan: Wound vac.  Continue present care.     Shaneece Stockburger M 11/20/2014, 10:41 AM

## 2014-11-20 NOTE — Progress Notes (Signed)
CRITICAL VALUE ALERT  Critical value received:  Hgb 6.7  Date of notification:  11/20/2014  Time of notification:  0735  Critical value read back:Yes.    Nurse who received alert:  Gerline Legacy RN  MD notified (1st page):  CCM (Dr Lamonte Sakai)  Time of first page:  [phone]  MD notified (2nd page):  Time of second page:  Responding MD:  Dr Lamonte Sakai  Time MD responded:  [phone]   At this time no orders received. MD will make rounds shortly to order.

## 2014-11-20 NOTE — Progress Notes (Signed)
OT Cancellation Note  Patient Details Name: Phillip Rocha MRN: [id number] DOB: Jan 26, 1961   Cancelled Treatment:    Reason Eval/Treat Not Completed: Patient not medically ready Intubated. Sedated. Will check on pt Monday [name], OTR/L  [phone] 11/20/2014 11/20/2014, 3:09 PM

## 2014-11-20 NOTE — Progress Notes (Signed)
CRITICAL VALUE ALERT  Critical value received:  Calcium  Date of notification:  11/20/2014   Time of notification:  2353  Critical value read back:Yes.    Nurse who received alert:  Gerline Legacy  MD notified (1st page):  [place]   Time of first page:  0747  MD notified (2nd page):  Time of second page:  Responding MD: Lamonte Sakai   Time MD responded:  Arin.Aland  MD will round shortly to place orders

## 2014-11-20 NOTE — Care Management Important Message (Signed)
Important Message  Patient Details  Name: Phillip Rocha MRN: [id number] Date of Birth: 24-Dec-1960   Medicare Important Message Given:  Yes-second notification given    Erenest Rasher, RN 11/20/2014, 2:11 PM

## 2014-11-20 NOTE — Consult Note (Addendum)
WOC wound consult note Reason for Consult: Consult requested for application of incisional Vac by Dr Louanne Skye.  Pt had recent back surgery and has been draining a significant amt around staples which are intact and well-approximated to middle back. Measurement: 20X1cm Drainage (amount, consistency, odor) Mod amt pink drainage, no odor  Periwound: No erythremia or fluctuance surrounding wound. Dressing procedure/placement/frequency: Applied Mepitel contact layer to protect skin, then one piece black foam and another bridged to flank to reduce pressure from track pad.  Pt tolerated without pain.  Incisional Vac dressings can remain in place up to 7 days, according to [facility] literature.  Please refer to Dr Louanne Skye for further plan of care and order to discontinue Vac dressing. Please re-consult if further assistance is needed.  Thank-you,  Julien Girt MSN, [place], [place], [place], [place]

## 2014-11-20 NOTE — Progress Notes (Signed)
ANTIBIOTIC CONSULT NOTE - FOLLOW UP  Pharmacy Consult for Vancomycin and Zosyn Indication: rule out pneumonia  No Known Allergies  Patient Measurements: Height: 5' 11" (180.3 cm) Weight: (!) 389 lb (176.449 kg) IBW/kg (Calculated) : 75.3  Vital Signs: Temp: 101 F (38.3 C) (10/07 0800) Temp Source: Axillary (10/07 0800) BP: 115/46 mmHg (10/07 0819) Pulse Rate: 85 (10/07 0819) Intake/Output from previous day: 10/06 0701 - 10/07 0700 In: 2941.9 [I.V.:2551; NG/GT:250.8; IV Piggyback:50] Out: 1180 [Urine:1180] Intake/Output from this shift: Total I/O In: -  Out: 350 [Urine:350]  Labs:  Recent Labs  11/18/14 0701 11/18/14 2135 11/19/14 0234 11/20/14 0600  WBC 21.2* 31.0* 35.0* 23.6*  HGB 9.8* 7.9* 8.0* 6.7*  PLT 160 147* 162 154  CREATININE 2.64*  --  3.88* 2.56*   Estimated Creatinine Clearance: 52.7 mL/min (by C-G formula based on Cr of 2.56).  Microbiology: Recent Results (from the past 720 hour(s))  Surgical pcr screen     Status: None   Collection Time: 11/09/14  9:11 AM  Result Value Ref Range Status   MRSA, PCR NEGATIVE NEGATIVE Final   Staphylococcus aureus NEGATIVE NEGATIVE Final    Comment:        The Xpert SA Assay (FDA approved for NASAL specimens in patients over 87 years of age), is one component of a comprehensive surveillance program.  Test performance has been validated by [facility] for patients greater than or equal to 66 year old. It is not intended to diagnose infection nor to guide or monitor treatment.   C difficile quick scan w PCR reflex     Status: None   Collection Time: 11/19/14 12:24 AM  Result Value Ref Range Status   C Diff antigen NEGATIVE NEGATIVE Final   C Diff toxin NEGATIVE NEGATIVE Final   C Diff interpretation Negative for toxigenic C. difficile  Final   Assessment: Phillip Rocha POD #3 spinal surgery was started on cefazolin for persistent incisional drainage. He is now having fevers to 101.3 so antibiotics will be  broadened to vancomycin and zosyn. He is in renal failure though sCr improved from yesterday 3.88 to 2.56. Will use obesity dosing nomogram given weight of 176kg.  Goal of Therapy:  Vancomycin trough level 15-20 mcg/ml  Plan:  1) Vancomycin 2g IV x 1 then 1748m IV q24 2) Zosyn 3.375g IV q8 (4 hour infusion) 3) Follow renal function, cultures, LOT, level at steady state  MDeboraha Sprang10/08/2014,9:50 AM

## 2014-11-21 ENCOUNTER — Inpatient Hospital Stay (HOSPITAL_COMMUNITY): Payer: Medicare HMO

## 2014-11-21 DIAGNOSIS — A419 Sepsis, unspecified organism: Secondary | ICD-10-CM

## 2014-11-21 DIAGNOSIS — Z9889 Other specified postprocedural states: Secondary | ICD-10-CM

## 2014-11-21 DIAGNOSIS — R6521 Severe sepsis with septic shock: Secondary | ICD-10-CM

## 2014-11-21 LAB — POCT I-STAT 3, ART BLOOD GAS (G3+)
Acid-Base Excess: 1 mmol/L (ref 0.0–2.0)
Acid-Base Excess: 1 mmol/L (ref 0.0–2.0)
Acid-Base Excess: 2 mmol/L (ref 0.0–2.0)
BICARBONATE: 28.2 meq/L — AB (ref 20.0–24.0)
Bicarbonate: 24.8 mEq/L — ABNORMAL HIGH (ref 20.0–24.0)
Bicarbonate: 26.4 mEq/L — ABNORMAL HIGH (ref 20.0–24.0)
O2 SAT: 75 %
O2 SAT: 98 %
O2 Saturation: 88 %
PCO2 ART: 38.5 mmHg (ref 35.0–45.0)
PCO2 ART: 42.4 mmHg (ref 35.0–45.0)
PH ART: 7.402 (ref 7.350–7.450)
PH ART: 7.331 — AB (ref 7.350–7.450)
PO2 ART: 41 mmHg — AB (ref 80.0–100.0)
PO2 ART: 102 mmHg — AB (ref 80.0–100.0)
PO2 ART: 61 mmHg — AB (ref 80.0–100.0)
Patient temperature: 99.7
Patient temperature: 98.7
Patient temperature: 100
TCO2: 26 mmol/L (ref 0–100)
TCO2: 28 mmol/L (ref 0–100)
TCO2: 30 mmol/L (ref 0–100)
pCO2 arterial: 53.8 mmHg — ABNORMAL HIGH (ref 35.0–45.0)
pH, Arterial: 7.42 (ref 7.350–7.450)

## 2014-11-21 LAB — GLUCOSE, CAPILLARY
GLUCOSE-CAPILLARY: 126 mg/dL — AB (ref 65–99)
GLUCOSE-CAPILLARY: 181 mg/dL — AB (ref 65–99)
GLUCOSE-CAPILLARY: 189 mg/dL — AB (ref 65–99)
GLUCOSE-CAPILLARY: 195 mg/dL — AB (ref 65–99)
Glucose-Capillary: 177 mg/dL — ABNORMAL HIGH (ref 65–99)
Glucose-Capillary: 121 mg/dL — ABNORMAL HIGH (ref 65–99)

## 2014-11-21 LAB — TYPE AND SCREEN
ABO/RH(D): B POS
ANTIBODY SCREEN: NEGATIVE
UNIT DIVISION: 0
Unit division: 0

## 2014-11-21 LAB — MAGNESIUM: Magnesium: 2.2 mg/dL (ref 1.7–2.4)

## 2014-11-21 LAB — BASIC METABOLIC PANEL
ANION GAP: 10 (ref 5–15)
BUN: 36 mg/dL — ABNORMAL HIGH (ref 6–20)
CHLORIDE: 106 mmol/L (ref 101–111)
CO2: 28 mmol/L (ref 22–32)
Calcium: 6.2 mg/dL — CL (ref 8.9–10.3)
Creatinine, Ser: 1.45 mg/dL — ABNORMAL HIGH (ref 0.61–1.24)
GFR calc non Af Amer: 52 mL/min — ABNORMAL LOW (ref >60)
GLUCOSE: 193 mg/dL — AB (ref 65–99)
POTASSIUM: 3.9 mmol/L (ref 3.5–5.1)
Sodium: 144 mmol/L (ref 135–145)

## 2014-11-21 LAB — CBC
HEMATOCRIT: 25.6 % — AB (ref 39.0–52.0)
HEMOGLOBIN: 8 g/dL — AB (ref 13.0–17.0)
MCH: 23.5 pg — ABNORMAL LOW (ref 26.0–34.0)
MCHC: 31.3 g/dL (ref 30.0–36.0)
MCV: 75.1 fL — AB (ref 78.0–100.0)
Platelets: 191 10*3/uL (ref 150–400)
RBC: 3.41 MIL/uL — ABNORMAL LOW (ref 4.22–5.81)
RDW: 17.5 % — AB (ref 11.5–15.5)
WBC: 20.9 10*3/uL — AB (ref 4.0–10.5)

## 2014-11-21 LAB — URINE CULTURE: CULTURE: NO GROWTH

## 2014-11-21 LAB — PHOSPHORUS: Phosphorus: 2.1 mg/dL — ABNORMAL LOW (ref 2.5–4.6)

## 2014-11-21 MED ORDER — FENTANYL CITRATE (PF) 100 MCG/2ML IJ SOLN
25.0000 ug | INTRAMUSCULAR | Status: DC | PRN
  Administered 2014-11-21 (×2): 100 ug via INTRAVENOUS
  Administered 2014-11-21: 50 ug via INTRAVENOUS
  Administered 2014-11-22 – 2014-11-23 (×6): 100 ug via INTRAVENOUS
  Administered 2014-11-23: 50 ug via INTRAVENOUS
  Administered 2014-11-23: 100 ug via INTRAVENOUS
  Filled 2014-11-21 (×11): qty 2

## 2014-11-21 MED ORDER — SODIUM PHOSPHATE 3 MMOLE/ML IV SOLN
30.0000 mmol | Freq: Once | INTRAVENOUS | Status: AC
  Administered 2014-11-21: 30 mmol via INTRAVENOUS
  Filled 2014-11-21: qty 10

## 2014-11-21 MED ORDER — ENOXAPARIN SODIUM 40 MG/0.4ML ~~LOC~~ SOLN
40.0000 mg | SUBCUTANEOUS | Status: DC
  Administered 2014-11-21 – 2014-11-24 (×4): 40 mg via SUBCUTANEOUS
  Filled 2014-11-21 (×3): qty 0.4

## 2014-11-21 MED ORDER — FUROSEMIDE 10 MG/ML IJ SOLN
40.0000 mg | Freq: Three times a day (TID) | INTRAMUSCULAR | Status: AC
  Administered 2014-11-21 (×2): 40 mg via INTRAVENOUS
  Filled 2014-11-21 (×2): qty 4

## 2014-11-21 NOTE — Progress Notes (Signed)
ANTICOAGULATION CONSULT NOTE - Initial Consult  Pharmacy Consult for Lovenox Indication: VTE prophylaxis  No Known Allergies  Patient Measurements: Height: 5' 11" (180.3 cm) Weight: (!) 381 lb (172.82 kg) IBW/kg (Calculated) : 75.3 Heparin Dosing Weight:   Vital Signs: Temp: 98.8 F (37.1 C) (10/08 0800) Temp Source: Axillary (10/08 0800) BP: 161/79 mmHg (10/08 0743) Pulse Rate: 68 (10/08 0743)  Labs:  Recent Labs  11/19/14 0234 11/20/14 0600 11/21/14 0556  HGB 8.0* 6.7* 8.0*  HCT 26.0* 22.6* 25.6*  PLT 162 154 191  CREATININE 3.88* 2.56* 1.45*    Estimated Creatinine Clearance: 92 mL/min (by C-G formula based on Cr of 1.45).   Medical History: Past Medical History  Diagnosis Date  . Hypertension   . Anxiety   . GERD (gastroesophageal reflux disease)   . Arthritis   . Psoriatic arthritis ([place])     takes Humira every 2 weeks  . Diabetes mellitus without complication (HCC)     NIDDM x 2 years  . Degenerative arthritis     knees and spine  . Psoriasis   . Morbid obesity ([place])   . Claustrophobia   . Anemia   . Lymphadenopathy 08/25/2014  . Shortness of breath dyspnea   . Cough with sputum     yellow color  . Headache     Medications:  Scheduled:  . sodium chloride   Intravenous Once  . antiseptic oral rinse  7 mL Mouth Rinse 10 times per day  . chlorhexidine gluconate  15 mL Mouth Rinse BID  . feeding supplement (PRO-STAT SUGAR FREE 64)  60 mL Per Tube BID  . feeding supplement (VITAL HIGH PROTEIN)  1,000 mL Per Tube Q24H  . hydrocortisone sodium succinate  50 mg Intravenous Q6H  . insulin aspart  0-20 Units Subcutaneous 6 times per day  . pantoprazole sodium  40 mg Per Tube Q24H  . piperacillin-tazobactam (ZOSYN)  IV  3.375 g Intravenous 3 times per day  . tiZANidine  2 mg Oral TID  . vancomycin  1,750 mg Intravenous Q24H  . vitamin B-12  1,000 mcg Oral Daily  . vitamin C  500 mg Oral Daily    Assessment: 54 yo who recently had a lumbar fusion  surgery who remains on vent. He has been followed by rx for abx dosing. Lovenox was ordered today for DVT prophylaxis. He is morbidly obese at 172kg. Due to his spinal surgery, his risk of bleeding will be increase. We're going to use standard dose instead of an increase dose due to his weight.   Goal of Therapy:   Monitor platelets by anticoagulation protocol: Yes   Plan:   Lovenox 54m SQ q24 F/u with bleeding  MOnnie Boer PharmD Pager: [phone]/09/2014 8:32 AM

## 2014-11-21 NOTE — Progress Notes (Signed)
[facility] Progress Note Patient Name: GER RINGENBERG DOB: 1960/04/09 MRN: [id number]   Date of Service  11/21/2014  HPI/Events of Note  Fentanyl ordered PRN for RASS and needs Fentanyl for pain.  eICU Interventions  Will order Fentanyl PRN for pain.      Intervention Category Intermediate Interventions: Pain - evaluation and management  Dawnyel Leven [place] 11/21/2014, 5:10 PM

## 2014-11-21 NOTE — Progress Notes (Signed)
[facility] Progress Note Patient Name: Phillip Rocha DOB: 1960/08/07 MRN: [id number]   Date of Service  11/21/2014  HPI/Events of Note  Self extubated earlier. Able to tolerate sips of water without issues.  eICU Interventions  Will order: 1. Clear liquid diet as tolerated.      Intervention Category Minor Interventions: Routine modifications to care plan (e.g. PRN medications for pain, fever)  Ajah Vanhoose [place] 11/21/2014, 4:36 PM

## 2014-11-21 NOTE — Progress Notes (Signed)
PULMONARY / CRITICAL CARE MEDICINE   Name: Phillip Rocha MRN: [id number] DOB: 03/20/1960    ADMISSION DATE:  11/17/2014 CONSULTATION DATE:  11/18/2014  REFERRING MD :  Dr. Louanne Skye  CHIEF COMPLAINT:  Back pain  INITIAL PRESENTATION:  54 yo male with back pain presented for re-do of L5-S1 fusion.  Remained on vent post-op.    STUDIES:   SIGNIFICANT EVENTS: 10/4 Redo transforaminal lumbar interbody fusions L3-4, L4-5, L5-S1, Revision of hardware L3 to S1, L3 to S1 Posterolateral fusion, Iliac wing fixation screws, L5-S1 Cage, Viviven, cancellous bone chips, rods and screws; EBL 3800 ml.  His surgery was about 12 hours with him in prone position.   HISTORY OF PRESENT ILLNESS:   Hx from Chart.  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  He had EBL 3800 ml.  He remained on vent post op.  SUBJECTIVE: Required neo overnight, suspect related to sedation.  VITAL SIGNS: Temp:  [98.7 F (37.1 C)-100.4 F (38 C)] 98.8 F (37.1 C) (10/08 0800) Pulse Rate:  [64-90] 67 (10/08 1000) Resp:  [0-30] 28 (10/08 1000) BP: (52-161)/(31-113) 161/73 mmHg (10/08 1000) SpO2:  [96 %-100 %] 97 % (10/08 1000) FiO2 (%):  [50 %-80 %] 50 % (10/08 1000) Weight:  [172.82 kg (381 lb)] 172.82 kg (381 lb) (10/08 0500) VENTILATOR SETTINGS: Vent Mode:  [-] PRVC FiO2 (%):  [50 %-80 %] 50 % Set Rate:  [28 bmp] 28 bmp Vt Set:  [600 mL] 600 mL PEEP:  [12 cmH20-14 cmH20] 12 cmH20 Plateau Pressure:  [28 cmH20-30 cmH20] 30 cmH20 INTAKE / OUTPUT:  Intake/Output Summary (Last 24 hours) at 11/21/14 1134 Last data filed at 11/21/14 1038  Gross per 24 hour  Intake 3681.98 ml  Output   6700 ml  Net -3018.02 ml   PHYSICAL EXAMINATION: General: In ICU room, relaxed on sedation. Neuro:  RASS -1, follows commands, moves all ext spontaneously. HEENT:  Facial edema resolved, ETT in place. Cardiovascular:  Regular, tachycardic when agitated. Lungs:  Coarse BS diffusely. Abdomen:   Obese, soft, NT, ND and +BS. Musculoskeletal:  1+ edema. Skin:  Extensive changes from psoriasis diffusely specially lower ext noted.  LABS:  CBC  Recent Labs Lab 11/19/14 0234 11/20/14 0600 11/21/14 0556  WBC 35.0* 23.6* 20.9*  HGB 8.0* 6.7* 8.0*  HCT 26.0* 22.6* 25.6*  PLT 162 154 191   Coag's  Recent Labs Lab 11/18/14 0701  APTT 28  INR 1.16   BMET  Recent Labs Lab 11/19/14 0234 11/20/14 0600 11/21/14 0556  NA 136 138 144  K 4.8 4.2 3.9  CL 105 103 106  CO2 20* 23 28  BUN 29* 43* 36*  CREATININE 3.88* 2.56* 1.45*  GLUCOSE 157* 165* 193*   Electrolytes  Recent Labs Lab 11/19/14 0234 11/20/14 0600 11/21/14 0556  CALCIUM 5.6* 5.5* 6.2*  MG 1.1* 1.8 2.2  PHOS 4.9* 3.4 2.1*   Sepsis Markers No results for input(s): LATICACIDVEN, PROCALCITON, O2SATVEN in the last 168 hours.   ABG  Recent Labs Lab 11/20/14 0345 11/21/14 0303 11/21/14 0436  PHART 7.307* 7.420 7.402  PCO2ART 43.0 38.5 42.4  PO2ART 54.3* 41.0* 102.0*   Liver Enzymes  Recent Labs Lab 11/19/14 0234  ALBUMIN 2.2*    Cardiac Enzymes  Recent Labs Lab 11/17/14 2207  TROPONINI <0.03   Glucose  Recent Labs Lab 11/20/14 1208 11/20/14 1535 11/20/14 1947 11/20/14 2341 11/21/14 0418 11/21/14 0903  GLUCAP 184* 212* 172* 181* 195*  126*   Imaging Dg Chest Port 1 View  11/21/2014   CLINICAL DATA:  Hx of ETT  EXAM: PORTABLE CHEST - 1 VIEW  COMPARISON:  the previous day's study  FINDINGS: Endotracheal tube, nasogastric tube, and left IJ central line are stable in position. Improved aeration with residual patchy perihilar and bibasilar airspace opacities left greater than right. Heart size upper limits normal for technique. No pneumothorax. No effusion. Visualized skeletal structures are unremarkable.  IMPRESSION: 1. Improved aeration with residual perihilar and bibasilar atelectasis or infiltrates. 2.  Support hardware stable in position.   Electronically Signed   By: Lucrezia Europe  M.D.   On: 11/21/2014 09:04   ASSESSMENT / PLAN:  PULMONARY ETT 10/4 >> A: Acute respiratory failure with hypoxia/hypercapnia. Hx of allergic rhinitis. P:   Decrease PEEP to 8. If FiO2 drops down to 40 then will decrease PEEP to 5. Lasix 40 mg IV q8 x2 doses. F/u CXR, ABG in AM. Hold outpt qnasl, dymista, allegra, singulair for now.  CARDIOVASCULAR A:  Hypotension 2nd to hypovolemia, and blood loss. Hx of HTN. P:  KVO IVF. CVP 14. Hold outpt norvasc, cozaar. Echo for EF and PAP unable to evaluate EF. Stress dose steroids. Neo and vaso as ordered, titrating down.  RENAL A:   AKI from volume loss >> Baseline Creatinine 0.77 from 11/09/14. Hyperkalemia resolved. Renal function deteriorating. P:   Optimize hemodynamics as able. Monitor renal fx, urine outpt, electrolytes. Replace electrolytes as indicated. BMET in AM. Lasix 40 mg IV q8 x2 doses.  GASTROINTESTINAL A:   Nutrition. Morbid obesity. Hx of GERD. P:   Protonix (uses Dexilant as outpt). Continue TF per nutrition.  HEMATOLOGIC A:   Anemia related to blood loss from surgery, down to 6.7 bleeding from surgical site. Leukocytosis 2nd to acute stress after surgery. P:  F/u CBC. SCD's. Add SQ heparin when okay with ortho. Transfuse 2 units pRBC.  INFECTIOUS A:   Post-op prophylaxis. Fever overnight. P:   Ancef per ortho.  Blood 10/7>>> Sputum 10/7>>> Urine 10/7>>>  Vanc 10/7>>> Zosyn 10/7>>> Ancef 10/4>>>10/7  ENDOCRINE A:   DM type II.   Hx of Gout. P:   SSI Allopurinol, glucophage Stress dose steroids.  NEUROLOGIC A:   Sedation. Hx of anxiety. P:   RASS goal: -1 Hold outpt zoloft, zanaflex, ambien, oxycodone, MS contin. D/C propofol. Versed PRN. Fentanyl drip.  ORTHOPEDICS A: S/p Re-do lumbar spine. P: Post op care per ortho.  The patient is critically ill with multiple organ systems failure and requires high complexity decision making for assessment and support,  frequent evaluation and titration of therapies, application of advanced monitoring technologies and extensive interpretation of multiple databases.   Critical Care Time devoted to patient care services described in this note is  35  Minutes. This time reflects time of care of this signee Dr Jennet Maduro. This critical care time does not reflect procedure time, or teaching time or supervisory time of PA/NP/Med student/Med Resident etc but could involve care discussion time.  Rush Farmer, M.D. [facility] Pulmonary/Critical Care Medicine. Pager: [phone]. After hours pager: [phone].

## 2014-11-21 NOTE — Progress Notes (Signed)
Called to room. Pt self extubated.  Nurse bagging pt at this time.  Placed on 50% venti   sats stable at 95  Pulse and RR also stable.  ABG to be drawn in 30 min.

## 2014-11-21 NOTE — Progress Notes (Signed)
Subjective: 4 Days Post-Op Procedure(s) (LRB): Redo transforaminal  lumbar interbody fusions L3-4, L4-5, L5-S1, Revision of hardware L3 to S1, L3 to S1 Posterolateral fusion, Iliac wing fixation screws, L5-S1 Cage, Viviven, cancellous bone chips, rods and screws (N/A) Patient reports pain as intubated.  sedation has been reduced. seems to understand discussion with him about weaning vent.  On 50%  was on 70% FIO2. .    Objective: Vital signs in last 24 hours: Temp:  [98.7 F (37.1 C)-100.4 F (38 C)] 98.8 F (37.1 C) (10/08 0800) Pulse Rate:  [64-90] 68 (10/08 0743) Resp:  [0-30] 28 (10/08 0743) BP: (52-161)/(31-113) 161/79 mmHg (10/08 0743) SpO2:  [97 %-100 %] 97 % (10/08 0743) FiO2 (%):  [50 %-90 %] 50 % (10/08 0800) Weight:  [172.82 kg (381 lb)] 172.82 kg (381 lb) (10/08 0500)  Intake/Output from previous day: 10/07 0701 - 10/08 0700 In: 4135.5 [I.V.:1556.5; Blood:360; OI/BB:0488; IV [id number]:888] Out: 7080 [Urine:6980; Drains:100] Intake/Output this shift: Total I/O In: 175.7 [I.V.:110.7; NG/GT:65] Out: 370 [Urine:370]   Recent Labs  11/18/14 2135 11/19/14 0234 11/20/14 0600 11/21/14 0556  HGB 7.9* 8.0* 6.7* 8.0*    Recent Labs  11/20/14 0600 11/21/14 0556  WBC 23.6* 20.9*  RBC 3.00* 3.41*  HCT 22.6* 25.6*  PLT 154 191    Recent Labs  11/20/14 0600 11/21/14 0556  NA 138 144  K 4.2 3.9  CL 103 106  CO2 23 28  BUN 43* 36*  CREATININE 2.56* 1.45*  GLUCOSE 165* 193*  CALCIUM 5.5* 6.2*   No results for input(s): LABPT, INR in the last 72 hours.  VAC working dressing reinforced.   Assessment/Plan: 4 Days Post-Op Procedure(s) (LRB): Redo transforaminal  lumbar interbody fusions L3-4, L4-5, L5-S1, Revision of hardware L3 to S1, L3 to S1 Posterolateral fusion, Iliac wing fixation screws, L5-S1 Cage, Viviven, cancellous bone chips, rods and screws (N/A) Plan: cont Vent wean, cont VAC. Still on some pressor support. Creatinine improved.   Phillip Rocha  C 11/21/2014, 10:20 AM

## 2014-11-21 NOTE — Progress Notes (Addendum)
Patient self extubated at 1400. Venturi-mask applied with 50% oxygen. Patient alert, vital signs stable,and maintaining oxygenation. Lungs clear, but diminished. Dr. Lorin Mercy (orthopaedics) and Dr. Titus Mould (CCM) contacted concerning event. Orders received from Dr. Titus Mould for an abg in thirty minutes. Will continue to monitor.

## 2014-11-21 NOTE — Progress Notes (Signed)
Last ABG was mixed venous. RT has permission from MD to [place] pt.

## 2014-11-22 ENCOUNTER — Inpatient Hospital Stay (HOSPITAL_COMMUNITY): Payer: Medicare HMO

## 2014-11-22 DIAGNOSIS — G894 Chronic pain syndrome: Secondary | ICD-10-CM

## 2014-11-22 LAB — PHOSPHORUS: Phosphorus: 3.2 mg/dL (ref 2.5–4.6)

## 2014-11-22 LAB — POCT I-STAT 3, ART BLOOD GAS (G3+)
Acid-Base Excess: 4 mmol/L — ABNORMAL HIGH (ref 0.0–2.0)
BICARBONATE: 30.8 meq/L — AB (ref 20.0–24.0)
O2 SAT: 95 %
PCO2 ART: 53.3 mmHg — AB (ref 35.0–45.0)
Patient temperature: 97.4
TCO2: 32 mmol/L (ref 0–100)
pH, Arterial: 7.366 (ref 7.350–7.450)
pO2, Arterial: 79 mmHg — ABNORMAL LOW (ref 80.0–100.0)

## 2014-11-22 LAB — CBC
HEMATOCRIT: 27 % — AB (ref 39.0–52.0)
HEMOGLOBIN: 8.2 g/dL — AB (ref 13.0–17.0)
MCH: 23.1 pg — AB (ref 26.0–34.0)
MCHC: 30.4 g/dL (ref 30.0–36.0)
MCV: 76.1 fL — ABNORMAL LOW (ref 78.0–100.0)
Platelets: 204 10*3/uL (ref 150–400)
RBC: 3.55 MIL/uL — ABNORMAL LOW (ref 4.22–5.81)
RDW: 17.5 % — ABNORMAL HIGH (ref 11.5–15.5)
WBC: 24.1 10*3/uL — ABNORMAL HIGH (ref 4.0–10.5)

## 2014-11-22 LAB — BASIC METABOLIC PANEL
Anion gap: 11 (ref 5–15)
BUN: 26 mg/dL — AB (ref 6–20)
CHLORIDE: 98 mmol/L — AB (ref 101–111)
CO2: 32 mmol/L (ref 22–32)
CREATININE: 0.97 mg/dL (ref 0.61–1.24)
Calcium: 6.5 mg/dL — ABNORMAL LOW (ref 8.9–10.3)
GFR calc Af Amer: >60 mL/min (ref >60)
GFR calc non Af Amer: >60 mL/min (ref >60)
GLUCOSE: 169 mg/dL — AB (ref 65–99)
Potassium: 3.9 mmol/L (ref 3.5–5.1)
SODIUM: 141 mmol/L (ref 135–145)

## 2014-11-22 LAB — GLUCOSE, CAPILLARY
GLUCOSE-CAPILLARY: 142 mg/dL — AB (ref 65–99)
GLUCOSE-CAPILLARY: 167 mg/dL — AB (ref 65–99)
GLUCOSE-CAPILLARY: 176 mg/dL — AB (ref 65–99)
GLUCOSE-CAPILLARY: 188 mg/dL — AB (ref 65–99)
Glucose-Capillary: 157 mg/dL — ABNORMAL HIGH (ref 65–99)
Glucose-Capillary: 140 mg/dL — ABNORMAL HIGH (ref 65–99)

## 2014-11-22 LAB — MAGNESIUM: Magnesium: 2 mg/dL (ref 1.7–2.4)

## 2014-11-22 MED ORDER — SODIUM CHLORIDE 0.9 % IJ SOLN
10.0000 mL | INTRAMUSCULAR | Status: DC | PRN
  Administered 2014-11-22: 10 mL
  Administered 2014-11-23: 20 mL
  Administered 2014-11-24: 10 mL
  Filled 2014-11-22 (×2): qty 40

## 2014-11-22 MED ORDER — AMLODIPINE BESYLATE 5 MG PO TABS
5.0000 mg | ORAL_TABLET | Freq: Every day | ORAL | Status: DC
  Administered 2014-11-22 – 2014-11-24 (×3): 5 mg via ORAL
  Filled 2014-11-22 (×3): qty 1

## 2014-11-22 MED ORDER — OXYCODONE-ACETAMINOPHEN 5-325 MG PO TABS
1.0000 | ORAL_TABLET | ORAL | Status: DC | PRN
  Administered 2014-11-22 – 2014-11-23 (×3): 1 via ORAL
  Filled 2014-11-22 (×4): qty 1

## 2014-11-22 MED ORDER — FUROSEMIDE 10 MG/ML IJ SOLN
40.0000 mg | Freq: Three times a day (TID) | INTRAMUSCULAR | Status: AC
  Administered 2014-11-22 (×2): 40 mg via INTRAVENOUS
  Filled 2014-11-22 (×2): qty 4

## 2014-11-22 MED ORDER — CETYLPYRIDINIUM CHLORIDE 0.05 % MT LIQD
7.0000 mL | Freq: Two times a day (BID) | OROMUCOSAL | Status: DC
  Administered 2014-11-22 – 2014-11-23 (×4): 7 mL via OROMUCOSAL

## 2014-11-22 MED ORDER — INSULIN ASPART 100 UNIT/ML ~~LOC~~ SOLN
0.0000 [IU] | Freq: Three times a day (TID) | SUBCUTANEOUS | Status: DC
  Administered 2014-11-22: 4 [IU] via SUBCUTANEOUS
  Administered 2014-11-22 (×2): 3 [IU] via SUBCUTANEOUS
  Administered 2014-11-22 – 2014-11-23 (×2): 4 [IU] via SUBCUTANEOUS
  Administered 2014-11-23: 3 [IU] via SUBCUTANEOUS
  Administered 2014-11-23 (×2): 4 [IU] via SUBCUTANEOUS
  Administered 2014-11-24: 3 [IU] via SUBCUTANEOUS

## 2014-11-22 MED ORDER — ZOLPIDEM TARTRATE 5 MG PO TABS
5.0000 mg | ORAL_TABLET | Freq: Every evening | ORAL | Status: DC | PRN
  Administered 2014-11-23: 5 mg via ORAL
  Filled 2014-11-22: qty 1

## 2014-11-22 MED ORDER — METHOCARBAMOL 500 MG PO TABS
500.0000 mg | ORAL_TABLET | Freq: Four times a day (QID) | ORAL | Status: DC | PRN
  Administered 2014-11-22 – 2014-11-24 (×5): 500 mg via ORAL
  Filled 2014-11-22 (×6): qty 1

## 2014-11-22 MED ORDER — DEXTROSE 5 % IV SOLN
500.0000 mg | Freq: Four times a day (QID) | INTRAVENOUS | Status: DC | PRN
  Filled 2014-11-22: qty 5

## 2014-11-22 MED ORDER — PANTOPRAZOLE SODIUM 40 MG PO TBEC
40.0000 mg | DELAYED_RELEASE_TABLET | Freq: Every day | ORAL | Status: DC
  Administered 2014-11-22 – 2014-11-24 (×3): 40 mg via ORAL
  Filled 2014-11-22 (×3): qty 1

## 2014-11-22 MED ORDER — SERTRALINE HCL 25 MG PO TABS
25.0000 mg | ORAL_TABLET | Freq: Every day | ORAL | Status: DC
  Administered 2014-11-22 – 2014-11-24 (×3): 25 mg via ORAL
  Filled 2014-11-22 (×3): qty 1

## 2014-11-22 NOTE — Progress Notes (Signed)
Subjective: 5 Days Post-Op Procedure(s) (LRB): Redo transforaminal  lumbar interbody fusions L3-4, L4-5, L5-S1, Revision of hardware L3 to S1, L3 to S1 Posterolateral fusion, Iliac wing fixation screws, L5-S1 Cage, Viviven, cancellous bone chips, rods and screws (N/A) Patient reports pain as moderate.  Self extubated , was in mittens.  Had some ice chips without problems.   Objective: Vital signs in last 24 hours: Temp:  [97.4 F (36.3 C)-99.1 F (37.3 C)] 97.5 F (36.4 C) (10/09 0400) Pulse Rate:  [65-88] 68 (10/09 0600) Resp:  [11-28] 11 (10/09 0600) BP: (115-161)/(53-83) 145/83 mmHg (10/09 0600) SpO2:  [90 %-100 %] 100 % (10/09 0600) FiO2 (%):  [50 %] 50 % (10/08 1600)  Intake/Output from previous day: 10/08 0701 - 10/09 0700 In: 2090.3 [P.O.:720; I.V.:304.8; NG/GT:195; IV Piggyback:870.5] Out: 3125 [Urine:3050; Drains:75] Intake/Output this shift:     Recent Labs  11/20/14 0600 11/21/14 0556 11/22/14 0609  HGB 6.7* 8.0* 8.2*    Recent Labs  11/21/14 0556 11/22/14 0609  WBC 20.9* 24.1*  RBC 3.41* 3.55*  HCT 25.6* 27.0*  PLT 191 204    Recent Labs  11/21/14 0556 11/22/14 0609  NA 144 141  K 3.9 3.9  CL 106 98*  CO2 28 32  BUN 36* 26*  CREATININE 1.45* 0.97  GLUCOSE 193* 169*  CALCIUM 6.2* 6.5*   No results for input(s): LABPT, INR in the last 72 hours.  Neurologically intact  Assessment/Plan: 5 Days Post-Op Procedure(s) (LRB): Redo transforaminal  lumbar interbody fusions L3-4, L4-5, L5-S1, Revision of hardware L3 to S1, L3 to S1 Posterolateral fusion, Iliac wing fixation screws, L5-S1 Cage, Viviven, cancellous bone chips, rods and screws (N/A) Up with therapy   Change CBG's to [facility] and HS with SSI, OOB recliner with assistance hopefully  Phillip Rocha C 11/22/2014, 7:20 AM

## 2014-11-22 NOTE — Evaluation (Signed)
Physical Therapy Evaluation Patient Details Name: Phillip Rocha MRN: [id number] DOB: 1960/10/03 Today's Date: 11/22/2014   History of Present Illness  Adm 10/4 for Redo TLIFs L3-4, L4-5, L5-S1, Revision of hardware L3 to S1, L3 to S1 Posterolateral fusion, Iliac wing fixation screws, L5-S1 Cage, rods and screws revised left S1 pedicle screw, L2-3 bilateral decompressive laminectomies. Explore left L5-S1 neuroforamen with decompressing and replacement of the retropulsed cage into the left L5-S1 disc space. Remained intubated 10/4-10/8 (self-extubated). PMHx-multiple knee surgeries, shoulder surgery, morbid obesity, psoriasis    Clinical Impression  Patient is s/p above surgery with multiple complications post-op resulting in functional limitations due to the deficits listed below (see PT Problem List). Pt highly motivated and realizes he cannot return home immediately upon discharge. He states he has been to SNF after previous hospitalizations. Patient will benefit from skilled PT to increase their independence and safety with mobility to allow discharge to the venue listed below.       Follow Up Recommendations SNF    Equipment Recommendations   (TBA)    Recommendations for Other Services       Precautions / Restrictions Precautions Precautions: Back;Fall Precaution Booklet Issued: No      Mobility  Bed Mobility               General bed mobility comments: pt on BSC on arrival; transfer to chair   Transfers Overall transfer level: Needs assistance Equipment used: Rolling walker (2 wheeled) Transfers: Sit to/from Stand Sit to Stand: Min assist (+3)         General transfer comment: Pt used Rt hand on RW and pushed off BSC with LUE. slight tipping of RW as pt pushed on Rt hand; Rt knee blocked to prevent buckling; pt progressed from sitting to standing with hip flexion 90 degrees/trunk parallel to the floor and then used bil UEs to push himself up to upright    Ambulation/Gait                Stairs            Wheelchair Mobility    Modified Rankin (Stroke Patients Only)       Balance Overall balance assessment: Needs assistance         Standing balance support: Bilateral upper extremity supported Standing balance-Leahy Scale: Poor Standing balance comment: Pt able to weight shift and support himself on his UEs while stepping Lt foot towards midline                              Pertinent Vitals/Pain Pain Assessment: Faces Faces Pain Scale: Hurts even more Pain Location: low back Pain Descriptors / Indicators: Operative site guarding Pain Intervention(s): Limited activity within patient's tolerance;Monitored during session;Repositioned    Home Living Family/patient expects to be discharged to:: Skilled nursing facility                      Prior Function Level of Independence: Independent         Comments: Wants to return to SNF he has used previously after othr surgeries     Hand Dominance        Extremity/Trunk Assessment   Upper Extremity Assessment: Overall WFL for tasks assessed           Lower Extremity Assessment: RLE deficits/detail RLE Deficits / Details: hip flexion 0/5, knee flexion 1+, knee extension 1+, ankle DF 0; PF 2+  Cervical / Trunk Assessment: Other exceptions  Communication   Communication: No difficulties  Cognition Arousal/Alertness: Awake/alert Behavior During Therapy: WFL for tasks assessed/performed                        General Comments      Exercises Other Exercises Other Exercises: Educated to continue to work on AROM with RLE (as he describes he has been doing)      Assessment/Plan    PT Assessment Patient needs continued PT services  PT Diagnosis Difficulty walking;Acute pain   PT Problem List Decreased strength;Decreased range of motion;Decreased activity tolerance;Decreased balance;Decreased mobility;Decreased  knowledge of use of DME;Decreased knowledge of precautions  PT Treatment Interventions DME instruction;Gait training;Functional mobility training;Therapeutic activities;Therapeutic exercise;Balance training;Patient/family education   PT Goals (Current goals can be found in the Care Plan section) Acute Rehab PT Goals Patient Stated Goal: regain strength in his Rt leg PT Goal Formulation: With patient Time For Goal Achievement: 11/29/14 Potential to Achieve Goals: Good    Frequency Min 3X/week   Barriers to discharge Decreased caregiver support      Co-evaluation               End of Session Equipment Utilized During Treatment: Gait belt (flat sheet used for gait belt) Activity Tolerance: Patient tolerated treatment well Patient left: in chair;with call bell/phone within reach;with family/visitor present Nurse Communication: Mobility status (RN present to assist)         Time: 0347-4259 PT Time Calculation (min) (ACUTE ONLY): 30 min   Charges:   PT Evaluation $Initial PT Evaluation Tier I: 1 Procedure PT Treatments $Therapeutic Activity: 8-22 mins   PT G Codes:        Latyra Jaye 11-29-2014, 3:43 PM Pager [phone]

## 2014-11-22 NOTE — Progress Notes (Addendum)
PULMONARY / CRITICAL CARE MEDICINE   Name: Phillip Rocha MRN: [id number] DOB: [birth year]    ADMISSION DATE:  11/17/2014 CONSULTATION DATE:  11/18/2014  REFERRING MD :  Dr. Louanne Skye  CHIEF COMPLAINT:  Back pain  INITIAL PRESENTATION:  54 yo male with back pain presented for re-do of L5-S1 fusion.  Remained on vent post-op.    STUDIES:   SIGNIFICANT EVENTS: 10/4 Redo transforaminal lumbar interbody fusions L3-4, L4-5, L5-S1, Revision of hardware L3 to S1, L3 to S1 Posterolateral fusion, Iliac wing fixation screws, L5-S1 Cage, Viviven, cancellous bone chips, rods and screws; EBL 3800 ml.  His surgery was about 12 hours with him in prone position.   HISTORY OF PRESENT ILLNESS:   Hx from Chart.  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  He had EBL 3800 ml.  He remained on vent post op.  SUBJECTIVE: Self extubated overnight.  VITAL SIGNS: Temp:  [97.4 F (36.3 C)-99.1 F (37.3 C)] 97.4 F (36.3 C) (10/09 0800) Pulse Rate:  [65-88] 70 (10/09 0800) Resp:  [10-28] 11 (10/09 0800) BP: (115-161)/(53-83) 136/69 mmHg (10/09 0800) SpO2:  [90 %-100 %] 97 % (10/09 0800) FiO2 (%):  [50 %] 50 % (10/08 1600) VENTILATOR SETTINGS: Vent Mode:  [-] PRVC FiO2 (%):  [50 %] 50 % Set Rate:  [28 bmp] 28 bmp Vt Set:  [600 mL] 600 mL PEEP:  [8 cmH20] 8 cmH20 Plateau Pressure:  [23 cmH20] 23 cmH20 INTAKE / OUTPUT:  Intake/Output Summary (Last 24 hours) at 11/22/14 0917 Last data filed at 11/22/14 0600  Gross per 24 hour  Intake 1857.4 ml  Output   2755 ml  Net -897.6 ml   PHYSICAL EXAMINATION: General: In ICU room, agitated at times. Neuro:  Alert and interactive, moving all ext to command. Head: [place]/AT EENT:  PERRL, EOM-I and MMM Cardiovascular:  RRR, Nl S1/S2, -M/R/G. Lungs: bibasilar crackles Abdomen:  Obese, soft, NT, ND and +BS. Musculoskeletal:  1+ edema bilaterally. Skin:  Extensive changes from psoriasis diffusely specially lower ext  noted.  LABS:  CBC  Recent Labs Lab 11/20/14 0600 11/21/14 0556 11/22/14 0609  WBC 23.6* 20.9* 24.1*  HGB 6.7* 8.0* 8.2*  HCT 22.6* 25.6* 27.0*  PLT 154 191 204   Coag's  Recent Labs Lab 11/18/14 0701  APTT 28  INR 1.16   BMET  Recent Labs Lab 11/20/14 0600 11/21/14 0556 11/22/14 0609  NA 138 144 141  K 4.2 3.9 3.9  CL 103 106 98*  CO2 23 28 32  BUN 43* 36* 26*  CREATININE 2.56* 1.45* 0.97  GLUCOSE 165* 193* 169*   Electrolytes  Recent Labs Lab 11/20/14 0600 11/21/14 0556 11/22/14 0609  CALCIUM 5.5* 6.2* 6.5*  MG 1.8 2.2 2.0  PHOS 3.4 2.1* 3.2   Sepsis Markers No results for input(s): LATICACIDVEN, PROCALCITON, O2SATVEN in the last 168 hours.   ABG  Recent Labs Lab 11/21/14 0436 11/21/14 1442 11/22/14 0318  PHART 7.402 7.331* 7.366  PCO2ART 42.4 53.8* 53.3*  PO2ART 102.0* 61.0* 79.0*   Liver Enzymes  Recent Labs Lab 11/19/14 0234  ALBUMIN 2.2*    Cardiac Enzymes  Recent Labs Lab 11/17/14 2207  TROPONINI <0.03   Glucose  Recent Labs Lab 11/21/14 1202 11/21/14 1610 11/21/14 1927 11/22/14 0021 11/22/14 0337 11/22/14 0808  GLUCAP 189* 177* 121* 167* 157* 140*   Imaging Dg Chest Port 1 View  11/22/2014   CLINICAL DATA:  Respiratory failure and recent extubation.  EXAM: PORTABLE CHEST 1 VIEW  COMPARISON:  11/21/2014  FINDINGS: Endotracheal tube is been removed. Nasogastric tube ill also has been removed. Left jugular central line tip in stable position at the origin of the SVC. Lungs show increase and interstitial edema since the prior study. No significant pleural fluid identified. The heart size is stable and mildly enlarged.  IMPRESSION: Increase in interstitial edema since the prior study.   Electronically Signed   By: Aletta Edouard M.D.   On: 11/22/2014 08:12   I reviewed CXR myself, improving pulmonary edema.  ASSESSMENT / PLAN:  PULMONARY ETT 10/4 >>10/8 A: Acute respiratory failure with hypoxia/hypercapnia. Hx  of allergic rhinitis. P:   Self extubated overnight, protecting his airway. Lasix 40 mg IV q6 x2 doses. F/u CXR, ABG in AM. Hold outpt qnasl, dymista, allegra, singulair for now.  CARDIOVASCULAR A:  Hypotension 2nd to hypovolemia, and blood loss. Hx of HTN. P:  KVO IVF. Hold outpt cozaar. Restart Norvasc at 5 mg with holding parameters. Echo for EF and PAP unable to evaluate EF. Stress dose steroids, chronically on steroids.  If able to tolerate PO will change to PO home dose in AM.. D/C pressors.  RENAL A:   AKI from volume loss >> Baseline Creatinine 0.77 from 11/09/14. Hyperkalemia resolved. Renal function deteriorating. P:   Optimize hemodynamics as able. Monitor renal fx, urine outpt, electrolytes. Replace electrolytes as indicated. BMET in AM. Lasix 40 mg IV q8 x2 doses.  GASTROINTESTINAL A:   Nutrition. Morbid obesity. Hx of GERD. P:   Protonix (uses Dexilant as outpt). Heart healthy diet.  HEMATOLOGIC A:   Anemia related to blood loss from surgery, down to 6.7 bleeding from surgical site. Leukocytosis 2nd to acute stress after surgery. P:  F/u CBC. SCD's. Add SQ heparin when okay with ortho. Transfuse 2 units pRBC.  INFECTIOUS A:   Post-op prophylaxis. Fever overnight. P:   Ancef per ortho.  Blood 10/7>>>NTD Sputum 10/7>>>NTD Urine 10/7>>>NTD  Vanc 10/7>>> Zosyn 10/7>>> Ancef 10/4>>>10/7  ENDOCRINE A:   DM type II.   Hx of Gout. P:   SSI Allopurinol, glucophage Stress dose steroids, see discussion above.  NEUROLOGIC A:   Sedation. Hx of anxiety. P:   RASS goal: -1 Restart Zanaflex, zoloft, ambien and percocet for pain control. Hold MS contin for now. D/C all sedation.  ORTHOPEDICS A: S/p Re-do lumbar spine. P: Post op care per ortho.  Discussed with bedside RN.  May transfer out of the ICU, PCCM will sign off, please call back if needed.  Rush Farmer, M.D. [facility] Pulmonary/Critical Care Medicine. Pager:  [phone]. After hours pager: [phone].

## 2014-11-22 NOTE — Progress Notes (Signed)
60 mg of Versed and 45 ml of Fentanyl, with a concentration of 2500 mcg in 250 ml bag wasted in sink and verified by M. Reinaldo Meeker, RN. Will continue to monitor.

## 2014-11-22 NOTE — Progress Notes (Signed)
PT Cancellation Note  Patient Details Name: Phillip Rocha MRN: [id number] DOB: [birth year]   Cancelled Treatment:    Reason Eval Not Completed: Noted pt now extubated with orders for OOB. Have spoken with Jenny Reichmann, RN and we will coordinate seeing pt together when his schedule permits (due to pt's size and recent immobility)   Alysandra Lobue 11/22/2014, 11:15 AM  Pager [phone]

## 2014-11-23 DIAGNOSIS — L409 Psoriasis, unspecified: Secondary | ICD-10-CM

## 2014-11-23 DIAGNOSIS — J155 Pneumonia due to Escherichia coli: Secondary | ICD-10-CM

## 2014-11-23 DIAGNOSIS — E118 Type 2 diabetes mellitus with unspecified complications: Secondary | ICD-10-CM

## 2014-11-23 DIAGNOSIS — J9602 Acute respiratory failure with hypercapnia: Secondary | ICD-10-CM

## 2014-11-23 LAB — CULTURE, RESPIRATORY

## 2014-11-23 LAB — BASIC METABOLIC PANEL
ANION GAP: 11 (ref 5–15)
BUN: 21 mg/dL — ABNORMAL HIGH (ref 6–20)
CALCIUM: 6.9 mg/dL — AB (ref 8.9–10.3)
CO2: 31 mmol/L (ref 22–32)
Chloride: 97 mmol/L — ABNORMAL LOW (ref 101–111)
Creatinine, Ser: 0.91 mg/dL (ref 0.61–1.24)
GLUCOSE: 188 mg/dL — AB (ref 65–99)
POTASSIUM: 3.4 mmol/L — AB (ref 3.5–5.1)
Sodium: 139 mmol/L (ref 135–145)

## 2014-11-23 LAB — CBC
HEMATOCRIT: 27.8 % — AB (ref 39.0–52.0)
Hemoglobin: 8.4 g/dL — ABNORMAL LOW (ref 13.0–17.0)
MCH: 22.8 pg — ABNORMAL LOW (ref 26.0–34.0)
MCHC: 30.2 g/dL (ref 30.0–36.0)
MCV: 75.5 fL — ABNORMAL LOW (ref 78.0–100.0)
Platelets: 262 10*3/uL (ref 150–400)
RBC: 3.68 MIL/uL — ABNORMAL LOW (ref 4.22–5.81)
RDW: 17.5 % — AB (ref 11.5–15.5)
WBC: 21.1 10*3/uL — ABNORMAL HIGH (ref 4.0–10.5)

## 2014-11-23 LAB — GLUCOSE, CAPILLARY
GLUCOSE-CAPILLARY: 142 mg/dL — AB (ref 65–99)
GLUCOSE-CAPILLARY: 182 mg/dL — AB (ref 65–99)
Glucose-Capillary: 165 mg/dL — ABNORMAL HIGH (ref 65–99)
Glucose-Capillary: 158 mg/dL — ABNORMAL HIGH (ref 65–99)
Glucose-Capillary: 199 mg/dL — ABNORMAL HIGH (ref 65–99)
Glucose-Capillary: 130 mg/dL — ABNORMAL HIGH (ref 65–99)

## 2014-11-23 LAB — CULTURE, RESPIRATORY W GRAM STAIN

## 2014-11-23 LAB — PHOSPHORUS: PHOSPHORUS: 2 mg/dL — AB (ref 2.5–4.6)

## 2014-11-23 LAB — MAGNESIUM: MAGNESIUM: 1.9 mg/dL (ref 1.7–2.4)

## 2014-11-23 MED ORDER — K PHOS MONO-SOD PHOS DI & MONO 155-852-130 MG PO TABS
500.0000 mg | ORAL_TABLET | Freq: Three times a day (TID) | ORAL | Status: AC
  Administered 2014-11-23 – 2014-11-24 (×2): 500 mg via ORAL
  Filled 2014-11-23 (×3): qty 2

## 2014-11-23 MED ORDER — VANCOMYCIN HCL 10 G IV SOLR
1250.0000 mg | Freq: Two times a day (BID) | INTRAVENOUS | Status: DC
Start: 2014-11-24 — End: 2014-11-23
  Filled 2014-11-23: qty 1250

## 2014-11-23 MED ORDER — GLUCERNA SHAKE PO LIQD: 237.0000 mL | Freq: Two times a day (BID) | ORAL | Status: DC

## 2014-11-23 MED ORDER — PRO-STAT SUGAR FREE PO LIQD
30.0000 mL | Freq: Two times a day (BID) | ORAL | Status: DC
  Administered 2014-11-23: 30 mL via ORAL
  Filled 2014-11-23 (×2): qty 30

## 2014-11-23 MED ORDER — OXYCODONE-ACETAMINOPHEN 5-325 MG PO TABS
1.0000 | ORAL_TABLET | ORAL | Status: DC | PRN
  Administered 2014-11-23 (×4): 2 via ORAL
  Administered 2014-11-23: 1 via ORAL
  Administered 2014-11-23 – 2014-11-24 (×5): 2 via ORAL
  Filled 2014-11-23 (×9): qty 2

## 2014-11-23 MED ORDER — HYDROCORTISONE NA SUCCINATE PF 100 MG IJ SOLR
50.0000 mg | Freq: Two times a day (BID) | INTRAMUSCULAR | Status: DC
Start: 2014-11-24 — End: 2014-11-24
  Filled 2014-11-23: qty 1

## 2014-11-23 MED ORDER — POTASSIUM CHLORIDE CRYS ER 20 MEQ PO TBCR
30.0000 meq | EXTENDED_RELEASE_TABLET | Freq: Once | ORAL | Status: AC
  Administered 2014-11-23: 30 meq via ORAL
  Filled 2014-11-23: qty 2

## 2014-11-23 MED ORDER — LEVOFLOXACIN 500 MG PO TABS
750.0000 mg | ORAL_TABLET | Freq: Every day | ORAL | Status: DC
  Administered 2014-11-23 – 2014-11-24 (×2): 750 mg via ORAL
  Filled 2014-11-23 (×2): qty 2

## 2014-11-23 MED ORDER — POTASSIUM PHOSPHATE MONOBASIC 500 MG PO TABS
500.0000 mg | ORAL_TABLET | Freq: Three times a day (TID) | ORAL | Status: DC
  Filled 2014-11-23 (×2): qty 1

## 2014-11-23 NOTE — Progress Notes (Signed)
Nutrition Follow-up  DOCUMENTATION CODES:   Morbid obesity  INTERVENTION:   Provide Glucerna Shake po BID, each supplement provides 220 kcal and 10 grams of protein.  Provide 30 ml Prostat po BID, each supplement provides 100 kcal and 15 grams of protein.  NUTRITION DIAGNOSIS:   Increased nutrient needs related to wound healing as evidenced by estimated needs; ongoing  GOAL:   Patient will meet greater than or equal to 90% of their needs; progressing  MONITOR:   PO intake, Supplement acceptance, Weight trends, Labs, I & O's  REASON FOR ASSESSMENT:   Consult Enteral/tube feeding initiation and management  ASSESSMENT:   54 yo male with back pain presented for re-do of L5-S1 fusion. Remained on vent post-op. Pt self extubated 10/9.  Meal completion has been 25-100% with most meals at 100%. Pt with wound VAC on back. Pt is agreeable to protein supplements to aid in wound healing. RD to order. Pt was encouraged to eat his food at meals.   Labs and medications reviewed.  Diet Order:  Diet Carb Modified Fluid consistency:: Thin; Room service appropriate?: Yes  Skin:   (Incision on back with wound VAC)  Last BM:  10/9  Height:   Ht Readings from Last 1 Encounters:  11/17/14 5' 11" (1.803 m)    Weight:   Wt Readings from Last 1 Encounters:  11/21/14 381 lb (172.82 kg)    Ideal Body Weight:  78.1 kg  BMI:  Body mass index is 53.16 kg/(m^2).  Estimated Nutritional Needs:   Kcal:  2426-8341  Protein:  125-140 grams  Fluid:  2.3 - 2.5 L/day  EDUCATION NEEDS:   No education needs identified at this time  Corrin Parker, MS, RD, LDN Pager # [phone] After hours/ weekend pager # [phone]

## 2014-11-23 NOTE — Progress Notes (Signed)
Subjective: 6 Days Post-Op Procedure(s) (LRB): Redo transforaminal  lumbar interbody fusions L3-4, L4-5, L5-S1, Revision of hardware L3 to S1, L3 to S1 Posterolateral fusion, Iliac wing fixation screws, L5-S1 Cage, Viviven, cancellous bone chips, rods and screws (N/A)  On Vancomycin and Zosyn. Moved bowels 3 times today. Stood and walked With therapy with 3+ assistance. I want to go to the nursing home.  Patient reports pain as marked.    Objective:   VITALS:  Temp:  [97.7 F (36.5 C)-97.9 F (36.6 C)] 97.9 F (36.6 C) (10/10 0451) Pulse Rate:  [67-72] 67 (10/10 0451) Resp:  [17-18] 18 (10/10 0451) BP: (104-115)/(56-69) 115/56 mmHg (10/10 0850) SpO2:  [97 %] 97 % (10/10 0451)  ABD soft Sensation intact distally Intact pulses distally Dorsiflexion/Plantar flexion intact Incision: moderate drainage Compartment soft Has weakness left leg both hip flexion and right knee extension.   LABS  Recent Labs  11/21/14 0556 11/22/14 0609 11/23/14 0445  HGB 8.0* 8.2* 8.4*  WBC 20.9* 24.1* 21.1*  PLT 191 204 262    Recent Labs  11/22/14 0609 11/23/14 0445  NA 141 139  K 3.9 3.4*  CL 98* 97*  CO2 32 31  BUN 26* 21*  CREATININE 0.97 0.91  GLUCOSE 169* 188*   No results for input(s): LABPT, INR in the last 72 hours.   Assessment/Plan: 6 Days Post-Op Procedure(s) (LRB): Redo transforaminal  lumbar interbody fusions L3-4, L4-5, L5-S1, Revision of hardware L3 to S1, L3 to S1 Posterolateral fusion, Iliac wing fixation screws, L5-S1 Cage, Viviven, cancellous bone chips, rods and screws (N/A)  Perioperative acute blood  Respiratory insufficiency, E.Coli growth from sputum C&S, pan sensitive to antibiotics. ATN, Cr 0.9 improved     Continue ABX therapy due to Post-op infection Continue foley due to strict I&O, urinary output monitoring and Will plan to discontinue in the AM at the start of the day. Discharge to SNF when we have stabilized him medically and are  satisfied that  He will be able to tolerated discharge to SNF. [facility] consulted for assistance with medications,diabetes, antibiotics and treatment of respiratory and renal concerns. Plan to discontinue VAC in the next 2 days after 7 days of use. He desires to go to SNF tomorrow but may need another several days to  Change to broad spectrum antibiotics. Will not use back brace as he had problems with desaturation O2 last surgery With brace use. He has an external fusion bone stimulator we will plan to have him use when he is able to return to his home.   Phillip Rocha E 11/23/2014, 4:47 PM

## 2014-11-23 NOTE — Evaluation (Addendum)
Occupational Therapy Evaluation Patient Details Name: Phillip Rocha MRN: [id number] DOB: 18-Dec-1960 Today's Date: 11/23/2014    History of Present Illness Adm 10/4 for Redo TLIFs L3-4, L4-5, L5-S1, Revision of hardware L3 to S1, L3 to S1 Posterolateral fusion, Iliac wing fixation screws, L5-S1 Cage, rods and screws revised left S1 pedicle screw, L2-3 bilateral decompressive laminectomies. Explore left L5-S1 neuroforamen with decompressing and replacement of the retropulsed cage into the left L5-S1 disc space. Remained intubated 10/4-10/8 (self-extubated). PMHx-multiple knee surgeries, shoulder surgery, morbid obesity, psoriasis   Clinical Impression   Pt reports he was independent with ADLs PTA. Currently pt is max-total assist for overall ADLs. Pt with increased frustration and anxiety, pt reports that he has been unable to sleep for the past 2 days and just wants to go to rehab. Recommending SNF for further rehab in order to increase independence with ADLs and functional mobility prior to return home. Pt would benefit from continued skilled OT services in order to maximize independence with LB ADLs and functional mobility while adhering to back precautions.     Follow Up Recommendations  SNF    Equipment Recommendations  Other (comment) (TBD at next venue)    Recommendations for Other Services       Precautions / Restrictions Precautions Precautions: Back;Fall Precaution Booklet Issued: No Restrictions Weight Bearing Restrictions: No      Mobility Bed Mobility               General bed mobility comments: Pt in recliner, returned to recliner at end of session  Transfers Overall transfer level: Needs assistance Equipment used: Rolling walker (2 wheeled) Transfers: Sit to/from Stand Sit to Stand: Min assist (+3)              Balance Overall balance assessment: Needs assistance         Standing balance support: Bilateral upper extremity  supported Standing balance-Leahy Scale: Poor                              ADL Overall ADL's : Needs assistance/impaired                                       General ADL Comments: No family present during OT eval. Pt is max-total assist for ADLs at this time. Pt reports that he has not slept in 2 days and wants to get out of the hospital as soon as possible and go to rehab. Pt reports that he feels like he is about to have an anxiety attack at end of session.     Vision     Perception     Praxis      Pertinent Vitals/Pain Pain Assessment: Faces Faces Pain Scale: Hurts even more Pain Location: back, R leg  Pain Descriptors / Indicators: Discomfort;Grimacing;Sore Pain Intervention(s): Limited activity within patient's tolerance;Monitored during session;Repositioned     Hand Dominance     Extremity/Trunk Assessment Upper Extremity Assessment Upper Extremity Assessment: Overall WFL for tasks assessed   Lower Extremity Assessment Lower Extremity Assessment: Defer to PT evaluation   Cervical / Trunk Assessment Cervical / Trunk Assessment: Other exceptions Cervical / Trunk Exceptions: morbid obesity   Communication Communication Communication: No difficulties   Cognition Arousal/Alertness: Awake/alert Behavior During Therapy: Anxious;Impulsive (Pt reports he hasnt slept in 2 days) Overall Cognitive Status: Within Functional Limits for tasks  assessed                     General Comments       Exercises       Shoulder Instructions      Home Living Family/patient expects to be discharged to:: Skilled nursing facility                                        Prior Functioning/Environment Level of Independence: Independent        Comments: Wants to return to SNF he has used previously after othr surgeries    OT Diagnosis: Generalized weakness;Acute pain   OT Problem List: Decreased activity tolerance;Impaired  balance (sitting and/or standing);Decreased safety awareness;Decreased knowledge of use of DME or AE;Decreased knowledge of precautions;Obesity;Pain   OT Treatment/Interventions: Self-care/ADL training;DME and/or AE instruction;Patient/family education    OT Goals(Current goals can be found in the care plan section) Acute Rehab OT Goals Patient Stated Goal: go to rehab facility OT Goal Formulation: With patient Time For Goal Achievement: 12/07/14 Potential to Achieve Goals: Good ADL Goals Pt Will Perform Lower Body Bathing: with adaptive equipment;sit to/from stand;with supervision (adhering to back precautions) Pt Will Perform Lower Body Dressing: with supervision;with adaptive equipment;sit to/from stand (adhering to back precautions) Pt Will Transfer to Toilet: with supervision;ambulating (adhering to back precautions)  OT Frequency: Min 2X/week   Barriers to D/C:            Co-evaluation PT/OT/SLP Co-Evaluation/Treatment: Yes Reason for Co-Treatment: For patient/therapist safety   OT goals addressed during session: ADL's and self-care      End of Session Equipment Utilized During Treatment: Gait belt;Rolling walker  Activity Tolerance: Patient tolerated treatment well Patient left: in chair;with call bell/phone within reach   Time: 0175-1025 OT Time Calculation (min): 28 min Charges:  OT General Charges $OT Visit: 1 Procedure OT Evaluation $Initial OT Evaluation Tier I: 1 Procedure G-Codes:     Binnie Kand M.S., OTR/L Pager: [phone]  11/23/2014, 11:58 AM

## 2014-11-23 NOTE — Progress Notes (Signed)
Patient ID: Phillip Rocha, male   DOB: [birth year], 54 y.o.   MRN: [id number]   I just spoke with wound care RN Dawn.  Stated that patient is on her list to be seen today.  She will make adjustments.

## 2014-11-23 NOTE — Care Management Important Message (Addendum)
Important Message  Patient Details  Name: Phillip Rocha MRN: [id number] Date of Birth: Nov 28, 1960   Medicare Important Message Given:  Yes-third notification given    Delorse Lek 11/23/2014, 2:19 PM
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
## 2014-11-23 NOTE — Progress Notes (Signed)
Subjective: Patient currently extubated.  States that he wants to go to rehab to start the process of getting better.  C/o back pain.  RN called our office and advised the wound vac not functioning properly.  Objective: Vital signs in last 24 hours: Temp:  [97.7 F (36.5 C)-97.9 F (36.6 C)] 97.9 F (36.6 C) (10/10 0451) Pulse Rate:  [67-72] 67 (10/10 0451) Resp:  [12-18] 18 (10/10 0451) BP: (104-157)/(50-73) 115/56 mmHg (10/10 0850) SpO2:  [97 %-100 %] 97 % (10/10 0451)  Intake/Output from previous day: 10/09 0701 - 10/10 0700 In: 1570 [P.O.:960; I.V.:10; IV Piggyback:600] Out: 3100 [Urine:3100] Intake/Output this shift: Total I/O In: 240 [P.O.:240] Out: -    Recent Labs  11/21/14 0556 11/22/14 0609 11/23/14 0445  HGB 8.0* 8.2* 8.4*    Recent Labs  11/22/14 0609 11/23/14 0445  WBC 24.1* 21.1*  RBC 3.55* 3.68*  HCT 27.0* 27.8*  PLT 204 262    Recent Labs  11/22/14 0609 11/23/14 0445  NA 141 139  K 3.9 3.4*  CL 98* 97*  CO2 32 31  BUN 26* 21*  CREATININE 0.97 0.91  GLUCOSE 169* 188*  CALCIUM 6.5* 6.9*   No results for input(s): LABPT, INR in the last 72 hours.  Exam:  Alert and oriented.  Sitting up in chair with PT.  bilat calves nontender.  No deficits.  Wound vac dressing on.    Assessment/Plan: Contacted RN danielle while on was on [place] but advised by CNA that she was in progression and i needed to wait.  Advised another RN to contact wound care nurse to change vac dressing.  Patient will need rehab.  Social work consult.  Brace on when up and ambulating       Donna Silverman M 11/23/2014, 10:12 AM

## 2014-11-23 NOTE — Progress Notes (Signed)
Physical Therapy Treatment Patient Details Name: Phillip Rocha MRN: [id number] DOB: 1960/06/11 Today's Date: 11/23/2014    History of Present Illness Adm 10/4 for Redo TLIFs L3-4, L4-5, L5-S1, Revision of hardware L3 to S1, L3 to S1 Posterolateral fusion, Iliac wing fixation screws, L5-S1 Cage, rods and screws revised left S1 pedicle screw, L2-3 bilateral decompressive laminectomies. Explore left L5-S1 neuroforamen with decompressing and replacement of the retropulsed cage into the left L5-S1 disc Rocha. Remained intubated 10/4-10/8 (self-extubated). PMHx-multiple knee surgeries, shoulder surgery, morbid obesity, psoriasis    PT Comments    Able to progress to ambulation today, requiring +3 assistance to assist with ambulation and lines. Patient reporting that he is wanting to get out of the hospital and go to a rehab facility. Agreeable with therapy but mild agitation which patient reports as due to no sleep and inability to get comfortable. Will continue to follow to progress mobility and independence.   Follow Up Recommendations  SNF     Equipment Recommendations  None recommended by PT (to be assessed at next level of care. )    Recommendations for Other Services       Precautions / Restrictions Precautions Precautions: Back;Fall Precaution Booklet Issued: No Restrictions Weight Bearing Restrictions: No    Mobility  Bed Mobility               General bed mobility comments: up in chair upon arrival  Transfers Overall transfer level: Needs assistance Equipment used: Rolling walker (2 wheeled) Transfers: Sit to/from Stand Sit to Stand: Min assist (+3)         General transfer comment: Patient pulling on rw with attempt to stand, not responding to cues for hand placement.   Ambulation/Gait Ambulation/Gait assistance: Min guard;+2 safety/equipment (+3 total, 2 for lines and 1 for assist with ambulation) Ambulation Distance (Feet): 12 Feet Assistive device:  Rolling walker (2 wheeled) Gait Pattern/deviations: Step-through pattern;Trunk flexed Gait velocity: decreased   General Gait Details: slow pattern, cues for improving standing posture   Stairs            Wheelchair Mobility    Modified Rankin (Stroke Patients Only)       Balance Overall balance assessment: Needs assistance Sitting-balance support: No upper extremity supported Sitting balance-Leahy Scale: Fair     Standing balance support: Bilateral upper extremity supported Standing balance-Leahy Scale: Poor Standing balance comment: using rw                    Cognition Arousal/Alertness: Awake/alert Behavior During Therapy: WFL for tasks assessed/performed Overall Cognitive Status: Within Functional Limits for tasks assessed                      Exercises      General Comments        Pertinent Vitals/Pain Pain Assessment: Faces Faces Pain Scale: Hurts whole lot Pain Location: back and whole Rt LE Pain Descriptors / Indicators: Sharp Pain Intervention(s): Limited activity within patient's tolerance;Monitored during session;Repositioned    Home Living Family/patient expects to be discharged to:: Skilled nursing facility                    Prior Function Level of Independence: Independent      Comments: Wants to return to SNF he has used previously after othr surgeries   PT Goals (current goals can now be found in the care plan section) Acute Rehab PT Goals Patient Stated Goal: go to rehab facility PT  Goal Formulation: With patient Time For Goal Achievement: 11/29/14 Potential to Achieve Goals: Good Progress towards PT goals: Progressing toward goals    Frequency  Min 3X/week    PT Plan Current plan remains appropriate    Co-evaluation PT/OT/SLP Co-Evaluation/Treatment: Yes Reason for Co-Treatment: Complexity of the patient's impairments (multi-system involvement) PT goals addressed during session: Mobility/safety  with mobility OT goals addressed during session: ADL's and self-care     End of Session Equipment Utilized During Treatment: Gait belt Activity Tolerance: Patient tolerated treatment well Patient left: in chair;with call bell/phone within reach     Time: 0855-0920 PT Time Calculation (min) (ACUTE ONLY): 25 min  Charges:  $Gait Training: 8-22 mins                    G Codes:      Cassell Clement, PT, CSCS Pager [phone] Office [phone]  11/23/2014, 1:33 PM

## 2014-11-23 NOTE — Progress Notes (Signed)
Pt "s pain level has been 10/10 most of the night with no reprieve and no sleep. Hooked up to cont. Pulse ox and 84mg of Fentanyl given. Will continue to monitor until end of shift.

## 2014-11-23 NOTE — Consult Note (Signed)
WOC wound consult note Reason for Consult: re-consulted due to problems with incisional VAC last evening.  Per nursing staff the machine was alarming leak.  Redressed the area and when this patient is in the bed the seal is maintained however the patient prefers to be up in a chair and when he is up in the chair the dressing is VERY positionally.  Due to the proximity of the distal end of the incision and the apex of the gluteal skin folds it is very difficult to maintain a seal on the incision. Wound type: spinal surgery Measurement: 20cm  Wound bed: stapled incision Drainage (amount, consistency, odor) serosanguinous, moderate. Saturated underpad last evening. Once the seal was lost. Periwound: intact Dressing procedure/placement/frequency: Cleansed the skin and applied skin prep.  Mepitel over the incision.  1pc of black foam applied over the incision.  Hydrocolloid applied in the apex of the gluteal fold to assist in seal, NPWT applied at 17mHG.  Dressing sealed immediately, however when this patient sat back in the chair the seal was lost.  I had the patient sit forward and it sealed again.  SO THIS IS QUITE POSITIONAL. Had patient sit forward and I placed an additional piece of hydrocolloid and drape.  Seal obtained and maintained with repositioning.  WOC will remain available, reconsult if needed  [place] [phone]

## 2014-11-23 NOTE — Progress Notes (Signed)
ANTIBIOTIC CONSULT NOTE - FOLLOW UP  Pharmacy Consult for Zosyn and Vancomycin Indication: r/o PNA  No Known Allergies  Patient Measurements: Height: 5' 11" (180.3 cm) Weight: (!) 381 lb (172.82 kg) IBW/kg (Calculated) : 75.3  Vital Signs: Temp: 97.9 F (36.6 C) (10/10 0451) Temp Source: Oral (10/10 0451) BP: 115/56 mmHg (10/10 0850) Pulse Rate: 67 (10/10 0451) Intake/Output from previous day: 10/09 0701 - 10/10 0700 In: 1570 [P.O.:960; I.V.:10; IV Piggyback:600] Out: 3100 [Urine:3100] Intake/Output from this shift: Total I/O In: 240 [P.O.:240] Out: -   Labs:  Recent Labs  11/21/14 0556 11/22/14 0609 11/23/14 0445  WBC 20.9* 24.1* 21.1*  HGB 8.0* 8.2* 8.4*  PLT 191 204 262  CREATININE 1.45* 0.97 0.91   Estimated Creatinine Clearance: 146.5 mL/min (by C-G formula based on Cr of 0.91). No results for input(s): VANCOTROUGH, VANCOPEAK, VANCORANDOM, GENTTROUGH, GENTPEAK, GENTRANDOM, TOBRATROUGH, TOBRAPEAK, TOBRARND, AMIKACINPEAK, AMIKACINTROU, AMIKACIN in the last 72 hours.   Microbiology: Recent Results (from the past 720 hour(s))  Surgical pcr screen     Status: None   Collection Time: 11/09/14  9:11 AM  Result Value Ref Range Status   MRSA, PCR NEGATIVE NEGATIVE Final   Staphylococcus aureus NEGATIVE NEGATIVE Final    Comment:        The Xpert SA Assay (FDA approved for NASAL specimens in patients over 62 years of age), is one component of a comprehensive surveillance program.  Test performance has been validated by [facility] for patients greater than or equal to 22 year old. It is not intended to diagnose infection nor to guide or monitor treatment.   C difficile quick scan w PCR reflex     Status: None   Collection Time: 11/19/14 12:24 AM  Result Value Ref Range Status   C Diff antigen NEGATIVE NEGATIVE Final   C Diff toxin NEGATIVE NEGATIVE Final   C Diff interpretation Negative for toxigenic C. difficile  Final  Urine culture     Status: None   Collection Time: 11/20/14 10:01 AM  Result Value Ref Range Status   Specimen Description URINE, CATHETERIZED  Final   Special Requests NONE  Final   Culture NO GROWTH 1 DAY  Final   Report Status 11/21/2014 FINAL  Final  Culture, respiratory (NON-Expectorated)     Status: None   Collection Time: 11/20/14 10:06 AM  Result Value Ref Range Status   Specimen Description TRACHEAL ASPIRATE  Final   Special Requests NONE  Final   Gram Stain   Final    ABUNDANT WBC PRESENT, PREDOMINANTLY PMN NO SQUAMOUS EPITHELIAL CELLS SEEN NO ORGANISMS SEEN Performed at [facility]    Culture   Final    FEW ESCHERICHIA COLI Performed at [facility]    Report Status 11/23/2014 FINAL  Final   Organism ID, Bacteria ESCHERICHIA COLI  Final      Susceptibility   Escherichia coli - MIC*    AMPICILLIN <=2 SENSITIVE Sensitive     AMPICILLIN/SULBACTAM <=2 SENSITIVE Sensitive     CEFEPIME <=1 SENSITIVE Sensitive     CEFTAZIDIME <=1 SENSITIVE Sensitive     CEFTRIAXONE <=1 SENSITIVE Sensitive     CIPROFLOXACIN <=0.25 SENSITIVE Sensitive     GENTAMICIN <=1 SENSITIVE Sensitive     IMIPENEM <=0.25 SENSITIVE Sensitive     PIP/TAZO <=4 SENSITIVE Sensitive     TOBRAMYCIN <=1 SENSITIVE Sensitive     TRIMETH/SULFA Value in next row Sensitive      <=20 SENSITIVE(NOTE)    *  FEW ESCHERICHIA COLI  Culture, blood (routine x 2)     Status: None (Preliminary result)   Collection Time: 11/20/14  6:33 PM  Result Value Ref Range Status   Specimen Description BLOOD LEFT ARM  Final   Special Requests BOTTLES DRAWN AEROBIC AND ANAEROBIC 5CC  Final   Culture NO GROWTH 3 DAYS  Final   Report Status PENDING  Incomplete  Culture, blood (routine x 2)     Status: None (Preliminary result)   Collection Time: 11/20/14  6:44 PM  Result Value Ref Range Status   Specimen Description BLOOD LEFT HAND  Final   Special Requests BOTTLES DRAWN AEROBIC AND ANAEROBIC 5CC  Final   Culture NO GROWTH 3 DAYS  Final   Report  Status PENDING  Incomplete    Anti-infectives    Start     Dose/Rate Route Frequency Ordered Stop   11/21/14 1000  vancomycin (VANCOCIN) 1,750 mg in sodium chloride 0.9 % 500 mL IVPB     1,750 mg 250 mL/hr over 120 Minutes Intravenous Every 24 hours 11/20/14 0950     11/20/14 1000  piperacillin-tazobactam (ZOSYN) IVPB 3.375 g     3.375 g 12.5 mL/hr over 240 Minutes Intravenous 3 times per day 11/20/14 0950     11/20/14 1000  vancomycin (VANCOCIN) 2,000 mg in sodium chloride 0.9 % 500 mL IVPB     2,000 mg 250 mL/hr over 120 Minutes Intravenous  Once 11/20/14 0950 11/20/14 1200   11/20/14 0800  ceFAZolin (ANCEF) IVPB 2 g/50 mL premix  Status:  Discontinued     2 g 100 mL/hr over 30 Minutes Intravenous 3 times per day 11/20/14 0753 11/20/14 0943   11/19/14 2215  ceFAZolin (ANCEF) IVPB 2 g/50 mL premix  Status:  Discontinued     2 g 100 mL/hr over 30 Minutes Intravenous Every 12 hours 11/19/14 2210 11/20/14 0753   11/18/14 0000  ceFAZolin (ANCEF) IVPB 1 g/50 mL premix     1 g 100 mL/hr over 30 Minutes Intravenous Every 8 hours 11/17/14 2020 11/18/14 0822   11/17/14 1900  vancomycin (VANCOCIN) powder 1,000 mg  Status:  Discontinued     1,000 mg Other To Surgery 11/17/14 1847 11/17/14 2020   11/17/14 1854  vancomycin (VANCOCIN) powder  Status:  Discontinued       As needed 11/17/14 1855 11/17/14 1958   11/17/14 1100  ceFAZolin (ANCEF) 3 g in dextrose 5 % 50 mL IVPB  Status:  Discontinued     3 g 160 mL/hr over 30 Minutes Intravenous To ShortStay Surgical 11/17/14 1049 11/17/14 2011   11/17/14 0715  ceFAZolin (ANCEF) 3 g in dextrose 5 % 50 mL IVPB     3 g 160 mL/hr over 30 Minutes Intravenous To ShortStay Surgical 11/16/14 1327 11/17/14 1606      Assessment: 54 yo M s/p spinal surgery with fevers so abx broadened. Pharmacy consulted to start zosyn and vancomycin. Now day #4 of broad spectrum abx for HCAP. Afebrile, WBC remains elevated at 21.1 (on steroids) SCr down to 0.91 (peaked at  3.88) normalized CrCl 66m/min. Blood cx's are ngtd and resp cx is growing pan sensitive E. Coli  Goal of Therapy:  Vancomycin trough level 15-20 mcg/ml  Resolution of infection  Plan:  Change vancomycin to 12568mIV Q12 Continue Zosyn 3.375g q8 hours Monitor clinical picture, renal function, VT prn F/U C&S, abx deescalation / LOT  Consider stopping vancomycin De-escalate abx as Resp cx growing pan sensitive E. coli?  Elenor Quinones, PharmD Clinical Pharmacist Pager [phone] 11/23/2014 1:45 PM

## 2014-11-24 ENCOUNTER — Encounter (HOSPITAL_COMMUNITY): Payer: Self-pay | Admitting: Specialist

## 2014-11-24 DIAGNOSIS — I10 Essential (primary) hypertension: Secondary | ICD-10-CM

## 2014-11-24 LAB — GLUCOSE, CAPILLARY
GLUCOSE-CAPILLARY: 124 mg/dL — AB (ref 65–99)
GLUCOSE-CAPILLARY: 134 mg/dL — AB (ref 65–99)
GLUCOSE-CAPILLARY: 142 mg/dL — AB (ref 65–99)
GLUCOSE-CAPILLARY: 148 mg/dL — AB (ref 65–99)
Glucose-Capillary: 142 mg/dL — ABNORMAL HIGH (ref 65–99)

## 2014-11-24 LAB — CBC
HEMATOCRIT: 28.1 % — AB (ref 39.0–52.0)
Hemoglobin: 8.6 g/dL — ABNORMAL LOW (ref 13.0–17.0)
MCH: 23.2 pg — ABNORMAL LOW (ref 26.0–34.0)
MCHC: 30.6 g/dL (ref 30.0–36.0)
MCV: 75.7 fL — AB (ref 78.0–100.0)
Platelets: 306 10*3/uL (ref 150–400)
RBC: 3.71 MIL/uL — AB (ref 4.22–5.81)
RDW: 17.5 % — ABNORMAL HIGH (ref 11.5–15.5)
WBC: 17.9 10*3/uL — AB (ref 4.0–10.5)

## 2014-11-24 LAB — BASIC METABOLIC PANEL
ANION GAP: 10 (ref 5–15)
BUN: 16 mg/dL (ref 6–20)
CHLORIDE: 97 mmol/L — AB (ref 101–111)
CO2: 32 mmol/L (ref 22–32)
Calcium: 7 mg/dL — ABNORMAL LOW (ref 8.9–10.3)
Creatinine, Ser: 0.8 mg/dL (ref 0.61–1.24)
Glucose, Bld: 138 mg/dL — ABNORMAL HIGH (ref 65–99)
POTASSIUM: 3 mmol/L — AB (ref 3.5–5.1)
SODIUM: 139 mmol/L (ref 135–145)

## 2014-11-24 LAB — MAGNESIUM: Magnesium: 2 mg/dL (ref 1.7–2.4)

## 2014-11-24 LAB — PHOSPHORUS: Phosphorus: 2 mg/dL — ABNORMAL LOW (ref 2.5–4.6)

## 2014-11-24 MED ORDER — LEVOFLOXACIN 750 MG PO TABS: 750.0000 mg | ORAL_TABLET | Freq: Every day | ORAL | Status: DC

## 2014-11-24 MED ORDER — TEMAZEPAM 30 MG PO CAPS: 30.0000 mg | ORAL_CAPSULE | Freq: Every evening | ORAL | Status: DC | PRN

## 2014-11-24 MED ORDER — TIZANIDINE HCL 2 MG PO TABS: 2.0000 mg | ORAL_TABLET | Freq: Three times a day (TID) | ORAL | Status: DC

## 2014-11-24 MED ORDER — HYDROXYZINE HCL 50 MG PO TABS: 100.0000 mg | ORAL_TABLET | Freq: Three times a day (TID) | ORAL | Status: AC | PRN

## 2014-11-24 MED ORDER — OXYCODONE HCL 30 MG PO TABS: 30.0000 mg | ORAL_TABLET | ORAL | Status: DC | PRN

## 2014-11-24 NOTE — Clinical Social Work Placement (Signed)
CLINICAL SOCIAL WORK PLACEMENT  NOTE  Date:  11/24/2014  Patient Details  Name: Phillip Rocha MRN: [id number] Date of Birth: Aug 09, 1960  Clinical Social Work is seeking post-discharge placement for this patient at the [place] level of care (*CSW will initial, date and re-position this form in  chart as items are completed):  Yes   Patient/family provided with [facility]'s list of facilities offering this level of care within the geographic area requested by the patient (or if unable, by the patient's family).  Yes   Patient/family informed of their freedom to choose among providers that offer the needed level of care, that participate in Medicare, Medicaid or managed care program needed by the patient, have an available bed and are willing to accept the patient.  Yes   Patient/family informed of [facility]'s ownership interest in [facility] and [facility], as well as of the fact that they are under no obligation to receive care at these facilities.  PASRR submitted to EDS on  (n/a)     PASRR number received on  (n/a)     Existing PASRR number confirmed on 11/24/14     FL2 transmitted to all facilities in geographic area requested by pt/family on 11/24/14     FL2 transmitted to all facilities within larger geographic area on  (n/a)     Patient informed that his/her managed care company has contracts with or will negotiate with certain facilities, including the following:   (yes, [facility])     Yes   Patient/family informed of bed offers received.  Patient chooses bed at [facility]     Physician recommends and patient chooses bed at      Patient to be transferred to [facility] on 11/24/14.  Patient to be transferred to facility by [facility]     Patient family notified on 11/24/14 of transfer.  Name of family member notified:  Patient updated at bedside.      PHYSICIAN       Additional Comment:    _______________________________________________ Caroline Sauger, LCSW 11/24/2014, 3:36 PM

## 2014-11-24 NOTE — Progress Notes (Signed)
Physical Therapy Treatment Patient Details Name: Phillip Rocha MRN: [id number] DOB: 10-28-1960 Today's Date: 11/24/2014    History of Present Illness Adm 10/4 for Redo TLIFs L3-4, L4-5, L5-S1, Revision of hardware L3 to S1, L3 to S1 Posterolateral fusion, Iliac wing fixation screws, L5-S1 Cage, rods and screws revised left S1 pedicle screw, L2-3 bilateral decompressive laminectomies. Explore left L5-S1 neuroforamen with decompressing and replacement of the retropulsed cage into the left L5-S1 disc space. Remained intubated 10/4-10/8 (self-extubated). PMHx-multiple knee surgeries, shoulder surgery, morbid obesity, psoriasis    PT Comments    Patient able to walk more attempts today and made it outside of room.  Seems very motivated to work and get home soon.  Will benefit from SNF level rehab prior to d/c home.  Follow Up Recommendations  SNF     Equipment Recommendations  None recommended by PT (TBA at [facility])    Recommendations for Other Services       Precautions / Restrictions Precautions Precautions: Back;Fall    Mobility  Bed Mobility               General bed mobility comments: up in chair upon arrival  Transfers Overall transfer level: Needs assistance Equipment used: Rolling walker (2 wheeled) Transfers: Sit to/from Stand Sit to Stand: Mod assist;+2 physical assistance         General transfer comment: cues for safety with hand placement, and for back precautions  Ambulation/Gait Ambulation/Gait assistance: Min guard;Min assist Ambulation Distance (Feet): 15 Feet (x 2 and 20') Assistive device: Rolling walker (2 wheeled) Gait Pattern/deviations: Step-through pattern;Steppage;Decreased step length - right;Decreased dorsiflexion - right;Trunk flexed     General Gait Details: facilitation at hips and shoulders for upright posture and cues, seated rests in between walks   Stairs            Wheelchair Mobility    Modified Rankin (Stroke  Patients Only)       Balance Overall balance assessment: Needs assistance   Sitting balance-Leahy Scale: Good     Standing balance support: Bilateral upper extremity supported Standing balance-Leahy Scale: Poor Standing balance comment: heavy UE support on RW for trunk extension                    Cognition Arousal/Alertness: Awake/alert Behavior During Therapy: Anxious Overall Cognitive Status: No family/caregiver present to determine baseline cognitive functioning                      Exercises      General Comments General comments (skin integrity, edema, etc.): sitting in chair upon my arrival with dressing from back on floor along with vac off and beeping and emesis basin in floor and scattering of skin scales on floor.  As patient stood noted drainage over linens in chair and replaced linens.  RN reports she did not remove the vac.      Pertinent Vitals/Pain Pain Assessment: 0-10 Pain Score: 7  Pain Location: back and right LE Pain Descriptors / Indicators: Sharp Pain Intervention(s): Repositioned;Monitored during session;Limited activity within patient's tolerance    Home Living                      Prior Function            PT Goals (current goals can now be found in the care plan section) Progress towards PT goals: Progressing toward goals    Frequency  Min 3X/week    PT Plan  Current plan remains appropriate    Co-evaluation             End of Session Equipment Utilized During Treatment: Gait belt Activity Tolerance: Patient tolerated treatment well Patient left: in chair;with call bell/phone within reach     Time: 1130-1202 PT Time Calculation (min) (ACUTE ONLY): 32 min  Charges:  $Gait Training: 23-37 mins                    G Codes:      Phillip Rocha,Phillip Rocha 2014/12/20, 1:01 PM Phillip Rocha, Phillip Rocha 2014-12-20

## 2014-11-24 NOTE — Progress Notes (Signed)
[facility] PROGRESS NOTE  Phillip Rocha HYQ:[id number] DOB: 1961-02-05 DOA: 11/17/2014 PCP: Velna Hatchet, MD  Assessment/Plan:  Acute hypoxic and hypercapnic respiratory failure Resolved. Comfortable on RA  DM 2 Controlled. Resume metformin at discharge  Acute renal failure resolved Resume losartan at discharge  Postop hypotension due to hypovolemia and ABLA resolved  ABLA - likely post op, acute illness, clinically without bleeding - s/p 2U pRBC on 10/7 for Hb of 6.7, now stable  Leukocytosis - likely due to steroids and infection - no diarrhea, currently afebrile - C diff negative  ?E coli HCAP - fever on 10/7 started antibiotics - started on vancomycin and Zosyn, per sensitivities and to cover other respiratory pathogens will narrow to Levaquin, recommend 8 days total  HTN - norvasc 5 restarted 10/9  - BP controlled and within normal limits  Depression  - Zoloft  Hypokalemia / hypophosphatemia Still low. Will give another dose prior to discharge  Medically stable for discharge to SNF  HPI/Subjective: No dyspnea. No complaints  Objective: Filed Vitals:   11/24/14 0437  BP: 142/65  Pulse: 74  Temp: 97.7 F (36.5 C)  Resp: 18    Intake/Output Summary (Last 24 hours) at 11/24/14 1549 Last data filed at 11/24/14 0900  Gross per 24 hour  Intake    620 ml  Output   1550 ml  Net   -930 ml   Filed Weights   11/20/14 0300 11/21/14 0500  Weight: 176.449 kg (389 lb) 172.82 kg (381 lb)    Exam:   General:  In chair. Comfortable  Neck: dressing in tatters. removed  Cardiovascular: RRR  Respiratory: CTA  Abdomen: obese, s, nt, nd  Ext: no CCE  Basic Metabolic Panel:  Recent Labs Lab 11/20/14 0600 11/21/14 0556 11/22/14 0609 11/23/14 0445 11/24/14 0450  NA 138 144 141 139 139  K 4.2 3.9 3.9 3.4* 3.0*  CL 103 106 98* 97* 97*  CO2 23 28 32 31 32  GLUCOSE 165* 193* 169* 188* 138*  BUN 43* 36* 26* 21* 16  CREATININE  2.56* 1.45* 0.97 0.91 0.80  CALCIUM 5.5* 6.2* 6.5* 6.9* 7.0*  MG 1.8 2.2 2.0 1.9 2.0  PHOS 3.4 2.1* 3.2 2.0* 2.0*   Liver Function Tests:  Recent Labs Lab 11/19/14 0234  ALBUMIN 2.2*   No results for input(s): LIPASE, AMYLASE in the last 168 hours. No results for input(s): AMMONIA in the last 168 hours. CBC:  Recent Labs Lab 11/20/14 0600 11/21/14 0556 11/22/14 0609 11/23/14 0445 11/24/14 0450  WBC 23.6* 20.9* 24.1* 21.1* 17.9*  HGB 6.7* 8.0* 8.2* 8.4* 8.6*  HCT 22.6* 25.6* 27.0* 27.8* 28.1*  MCV 75.3* 75.1* 76.1* 75.5* 75.7*  PLT 154 191 204 262 306   Cardiac Enzymes:  Recent Labs Lab 11/17/14 2207  TROPONINI <0.03   BNP (last 3 results) No results for input(s): BNP in the last 8760 hours.  ProBNP (last 3 results) No results for input(s): PROBNP in the last 8760 hours.  CBG:  Recent Labs Lab 11/23/14 2005 11/24/14 0010 11/24/14 0407 11/24/14 0739 11/24/14 1210  GLUCAP 130* 142* 148* 142* 124*    Recent Results (from the past 240 hour(s))  C difficile quick scan w PCR reflex     Status: None   Collection Time: 11/19/14 12:24 AM  Result Value Ref Range Status   C Diff antigen NEGATIVE NEGATIVE Final   C Diff toxin NEGATIVE NEGATIVE Final   C Diff interpretation Negative for toxigenic C. difficile  Final  Urine culture     Status: None   Collection Time: 11/20/14 10:01 AM  Result Value Ref Range Status   Specimen Description URINE, CATHETERIZED  Final   Special Requests NONE  Final   Culture NO GROWTH 1 DAY  Final   Report Status 11/21/2014 FINAL  Final  Culture, respiratory (NON-Expectorated)     Status: None   Collection Time: 11/20/14 10:06 AM  Result Value Ref Range Status   Specimen Description TRACHEAL ASPIRATE  Final   Special Requests NONE  Final   Gram Stain   Final    ABUNDANT WBC PRESENT, PREDOMINANTLY PMN NO SQUAMOUS EPITHELIAL CELLS SEEN NO ORGANISMS SEEN Performed at [facility]    Culture   Final    FEW ESCHERICHIA  COLI Performed at [facility]    Report Status 11/23/2014 FINAL  Final   Organism ID, Bacteria ESCHERICHIA COLI  Final      Susceptibility   Escherichia coli - MIC*    AMPICILLIN <=2 SENSITIVE Sensitive     AMPICILLIN/SULBACTAM <=2 SENSITIVE Sensitive     CEFEPIME <=1 SENSITIVE Sensitive     CEFTAZIDIME <=1 SENSITIVE Sensitive     CEFTRIAXONE <=1 SENSITIVE Sensitive     CIPROFLOXACIN <=0.25 SENSITIVE Sensitive     GENTAMICIN <=1 SENSITIVE Sensitive     IMIPENEM <=0.25 SENSITIVE Sensitive     PIP/TAZO <=4 SENSITIVE Sensitive     TOBRAMYCIN <=1 SENSITIVE Sensitive     TRIMETH/SULFA Value in next row Sensitive      <=20 SENSITIVE(NOTE)    * FEW ESCHERICHIA COLI  Culture, blood (routine x 2)     Status: None (Preliminary result)   Collection Time: 11/20/14  6:33 PM  Result Value Ref Range Status   Specimen Description BLOOD LEFT ARM  Final   Special Requests BOTTLES DRAWN AEROBIC AND ANAEROBIC 5CC  Final   Culture NO GROWTH 4 DAYS  Final   Report Status PENDING  Incomplete  Culture, blood (routine x 2)     Status: None (Preliminary result)   Collection Time: 11/20/14  6:44 PM  Result Value Ref Range Status   Specimen Description BLOOD LEFT HAND  Final   Special Requests BOTTLES DRAWN AEROBIC AND ANAEROBIC 5CC  Final   Culture NO GROWTH 4 DAYS  Final   Report Status PENDING  Incomplete     Studies: No results found.  Scheduled Meds: . amLODipine  5 mg Oral Daily  . antiseptic oral rinse  7 mL Mouth Rinse BID  . enoxaparin (LOVENOX) injection  40 mg Subcutaneous Q24H  . feeding supplement (GLUCERNA SHAKE)  237 mL Oral BID BM  . feeding supplement (PRO-STAT SUGAR FREE 64)  30 mL Oral BID  . hydrocortisone sodium succinate  50 mg Intravenous Q12H  . insulin aspart  0-20 Units Subcutaneous TID WC & HS  . levofloxacin  750 mg Oral Daily  . pantoprazole  40 mg Oral Daily  . phosphorus  500 mg Oral TID WC & HS  . sertraline  25 mg Oral Daily  . tiZANidine  2 mg Oral  TID  . vitamin B-12  1,000 mcg Oral Daily  . vitamin C  500 mg Oral Daily   Continuous Infusions: . fentaNYL infusion INTRAVENOUS Stopped (11/21/14 1400)   Time spent: 15 minutes  [facility] [email or web address], password [facility] 11/24/2014, 3:49 PM  LOS: 7 days

## 2014-11-24 NOTE — Progress Notes (Signed)
Nursing Tech prepared for d/c to SNF. Percocet 2 Tabs given for pain/ manage PTAR transfer. Taken by Biomedical scientist with personal belongings and 2 attendants to [facility].

## 2014-11-24 NOTE — Progress Notes (Signed)
Clarified with Dr. Louanne Skye - stated he would d/c orders for CVP reading and NG flush/ check for residual. Stated Lt IJ triple lumen to be d/c'ed.

## 2014-11-24 NOTE — Discharge Planning (Signed)
Patient to be discharged to [facility]. Patient updated at bedside.  Facility: [facility] RN report number: [phone] Transportation: EMS  Lubertha Sayres, [place] [phone]) and Surgical [phone])

## 2014-11-24 NOTE — Clinical Social Work Placement (Signed)
CLINICAL SOCIAL WORK PLACEMENT  NOTE  Date:  11/24/2014  Patient Details  Name: Phillip LACEK MRN: [id number] Date of Birth: April 02, 1960  Clinical Social Work is seeking post-discharge placement for this patient at the [place] level of care (*CSW will initial, date and re-position this form in  chart as items are completed):  Yes   Patient/family provided with [facility] Work Department's list of facilities offering this level of care within the geographic area requested by the patient (or if unable, by the patient's family).  Yes   Patient/family informed of their freedom to choose among providers that offer the needed level of care, that participate in Medicare, Medicaid or managed care program needed by the patient, have an available bed and are willing to accept the patient.  Yes   Patient/family informed of [facility]'s ownership interest in [facility], as well as of the fact that they are under no obligation to receive care at these facilities.  PASRR submitted to EDS on  (n/a)     PASRR number received on  (n/a)     Existing PASRR number confirmed on 11/24/14     FL2 transmitted to all facilities in geographic area requested by pt/family on 11/24/14     FL2 transmitted to all facilities within larger geographic area on  (n/a)     Patient informed that his/her managed care company has contracts with or will negotiate with certain facilities, including the following:   (yes, [facility])     Yes   Patient/family informed of bed offers received.  Patient chooses bed at [facility]     Physician recommends and patient chooses bed at      Patient to be transferred to [facility] on 11/24/14.  Patient to be transferred to facility by PTAR     Patient family notified on 11/24/14 of transfer.  Name of family member notified:  Patient updated at bedside.     PHYSICIAN       Additional  Comment:    _______________________________________________ Caroline Sauger, LCSW 11/24/2014, 11:06 AM [phone]

## 2014-11-24 NOTE — Clinical Social Work Note (Signed)
Clinical Social Work Assessment  Patient Details  Name: Phillip Rocha MRN: [id number] Date of Birth: 26-Aug-1960  Date of referral:  11/24/14               Reason for consult:  Facility Placement, Discharge Planning                Permission sought to share information with:  Facility Sport and exercise psychologist, Family Supports Permission granted to share information::  Yes, Verbal Permission Granted  Name::     Ahsan Esterline, [place]::  [facility] SNF (preference: [facility])  Relationship::  Son  Contact Information:  No number provided.  Housing/Transportation Living arrangements for the past 2 months:  [place] of Information:  Patient Patient Interpreter Needed:  None Criminal Activity/Legal Involvement Pertinent to Current Situation/Hospitalization:  No - Comment as needed Significant Relationships:  Adult Children Lives with:  Adult Children Do you feel safe going back to the place where you live?  No (High fall risk.) Need for family participation in patient care:  No (Coment) (Patient able to make own decisions.)  Care giving concerns:  Patient expressed no concerns at this time.   Social Worker assessment / plan:  CSW received referral for possible SNF placement at time of discharge. CSW met with patient at bedside to discuss discharge planning needs. Patient expressed understanding of PT recommendation for SNF placement at time of discharge. Per patient, patient has previously (3 times) admitted to [place] and would prefer to return at time of discharge. CSW to continue to follow and assist with discharge planning needs.  Employment status:  Other (Comment) (Did not disclose employment.) Insurance information:  Managed Medicare Doctor, general practice) PT Recommendations:  [place] / Referral to community resources:  [place]  Patient/Family's Response to care:  Patient  understanding and agreeable to CSW plan of care.  Patient/Family's Understanding of and Emotional Response to Diagnosis, Current Treatment, and Prognosis:  Patient understanding and agreeable to CSW plan of care.  Emotional Assessment Appearance:  Appears stated age Attitude/Demeanor/Rapport:  Other (Pleasant.) Affect (typically observed):  Appropriate, Accepting, Hopeful, Pleasant Orientation:  Oriented to Self, Oriented to Place, Oriented to  Time, Oriented to Situation Alcohol / Substance use:  Not Applicable Psych involvement (Current and /or in the community):  No (Comment) (Not appropriate on this admission.)  Discharge Needs  Concerns to be addressed:  No discharge needs identified Readmission within the last 30 days:  No Current discharge risk:  None Barriers to Discharge:  No Barriers Identified   Caroline Sauger, LCSW 11/24/2014, 11:03 AM [phone]

## 2014-11-24 NOTE — Progress Notes (Addendum)
Subjective: 7 Days Post-Op Procedure(s) (LRB): Redo transforaminal  lumbar interbody fusions L3-4, L4-5, L5-S1, Revision of hardware L3 to S1, L3 to S1 Posterolateral fusion, Iliac wing fixation screws, L5-S1 Cage, Viviven, cancellous bone chips, rods and screws (N/A) Awake, alert and oriented x 4. I need to get out of here. I can't sleep, the bed is Wrong, I can't fit in it. Tolerating po meds and nourishment.  Seen by [facility], appreciate their help with this complex patient Patient reports pain as marked.    Objective:   VITALS:  Temp:  [97.5 F (36.4 C)-97.8 F (36.6 C)] 97.7 F (36.5 C) (10/11 0437) Pulse Rate:  [72-82] 74 (10/11 0437) Resp:  [18-20] 18 (10/11 0437) BP: (132-143)/(61-65) 142/65 mmHg (10/11 0437) SpO2:  [98 %-100 %] 98 % (10/11 0437)  ABD soft Neurovascular intact Sensation intact distally Intact pulses distally Dorsiflexion/Plantar flexion intact Incision: no drainage No cellulitis present Compartment soft VAC is rolled up and not maintaining a dressing. VAC discontinued.   LABS  Recent Labs  11/22/14 0609 11/23/14 0445 11/24/14 0450  HGB 8.2* 8.4* 8.6*  WBC 24.1* 21.1* 17.9*  PLT 204 262 306    Recent Labs  11/23/14 0445 11/24/14 0450  NA 139 139  K 3.4* 3.0*  CL 97* 97*  CO2 31 32  BUN 21* 16  CREATININE 0.91 0.80  GLUCOSE 188* 138*   No results for input(s): LABPT, INR in the last 72 hours.   Assessment/Plan: 7 Days Post-Op Procedure(s) (LRB): Redo transforaminal  lumbar interbody fusions L3-4, L4-5, L5-S1, Revision of hardware L3 to S1, L3 to S1 Posterolateral fusion, Iliac wing fixation screws, L5-S1 Cage, Viviven, cancellous bone chips, rods and screws (N/A)  Advance diet Up with therapy D/C IV fluids Continue ABX therapy due to Post-op infection changed to levaquin per IM. Will discontinue central line today and VAC. Allow to shower, change dressing post shower Will plan to discharge to SNF  tomorrow.  Phillip Rocha 11/24/2014, 8:51 AM

## 2014-11-24 NOTE — Discharge Summary (Signed)
Physician Discharge Summary      Patient ID: Phillip Rocha MRN: [id number] DOB/AGE: [id number] 56 y.o.  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 coli pneumonia ([place])   Discharge Diagnoses:  Same  Past Medical History  Diagnosis Date  . Hypertension   . Anxiety   . GERD (gastroesophageal reflux disease)   . Arthritis   . Psoriatic arthritis ([place])     takes Humira every 2 weeks  . Diabetes mellitus without complication (HCC)     NIDDM x 2 years  . Degenerative arthritis     knees and spine  . Psoriasis   . Morbid obesity ([place])   . Claustrophobia   . Anemia   . Lymphadenopathy 08/25/2014  . Shortness of breath dyspnea   . Cough with sputum     yellow color  . Headache     Surgeries: Procedure(s): Redo transforaminal  lumbar interbody fusions L3-4, L4-5, L5-S1, Revision of hardware L3 to S1, L3 to S1 Posterolateral fusion, Iliac wing fixation screws, L5-S1 Cage, Viviven, cancellous bone chips, rods and screws on 11/17/2014   Consultants:   [facility] Intensive Care specialist  Discharged Condition: Improved  Hospital Course: Phillip Rocha is an 54 y.o. male who was admitted 11/17/2014 with a chief complaint of No chief complaint on file. , and found to have a diagnosis of Pseudarthrosis after fusion or arthrodesis.  He was brought to the operating room on 11/17/2014 and underwent the above named procedures.    He was  given perioperative antibiotics:  Anti-infectives    Start     Dose/Rate Route Frequency Ordered Stop   11/24/14 0800  vancomycin (VANCOCIN) 1,250 mg in sodium chloride 0.9 % 250 mL IVPB  Status:  Discontinued     1,250 mg 166.7 mL/hr over 90 Minutes Intravenous Every 12 hours 11/23/14 1346 11/23/14 1736   11/24/14 0000  levofloxacin (LEVAQUIN) 750 MG tablet     750 mg Oral Daily 11/24/14 0920     11/23/14 1830  levofloxacin (LEVAQUIN) tablet 750 mg     750 mg Oral Daily 11/23/14 1736     11/21/14 1000  vancomycin (VANCOCIN) 1,750 mg in sodium chloride 0.9 % 500 mL IVPB  Status:  Discontinued     1,750 mg 250 mL/hr over 120 Minutes Intravenous Every 24 hours 11/20/14 0950 11/23/14 1346   11/20/14 1000  piperacillin-tazobactam (ZOSYN) IVPB 3.375 g  Status:  Discontinued     3.375 g 12.5 mL/hr over 240 Minutes Intravenous 3 times per day 11/20/14 0950 11/23/14 1736   11/20/14 1000  vancomycin (VANCOCIN) 2,000 mg in sodium chloride 0.9 % 500 mL IVPB     2,000 mg 250 mL/hr over 120 Minutes Intravenous  Once 11/20/14 0950 11/20/14 1200   11/20/14 0800  ceFAZolin (ANCEF) IVPB 2 g/50 mL premix  Status:  Discontinued     2 g 100 mL/hr over 30 Minutes Intravenous 3 times per day  11/20/14 0753 11/20/14 0943   11/19/14 2215  ceFAZolin (ANCEF) IVPB 2 g/50 mL premix  Status:  Discontinued     2 g 100 mL/hr over 30 Minutes Intravenous Every 12 hours 11/19/14 2210 11/20/14 0753   11/18/14 0000  ceFAZolin (ANCEF) IVPB 1 g/50 mL premix     1 g 100 mL/hr over 30 Minutes Intravenous Every 8 hours 11/17/14 2020 11/18/14 0822   11/17/14 1900  vancomycin (VANCOCIN) powder 1,000 mg  Status:  Discontinued     1,000 mg Other To Surgery 11/17/14 1847 11/17/14 2020   11/17/14 1854  vancomycin (VANCOCIN) powder  Status:  Discontinued       As needed 11/17/14 1855 11/17/14 1958   11/17/14 1100  ceFAZolin (ANCEF) 3 g in dextrose 5 % 50 mL IVPB  Status:  Discontinued     3 g 160 mL/hr over 30 Minutes Intravenous  To [facility]Stay Surgical 11/17/14 1049 11/17/14 2011   11/17/14 0715  ceFAZolin (ANCEF) 3 g in dextrose 5 % 50 mL IVPB     3 g 160 mL/hr over 30 Minutes Intravenous To ShortStay Surgical 11/16/14 1327 11/17/14 1606     patient's surgery took nearly to 12 hours with 3800 mL of blood loss and Cell Saver replacements of 1770 mL. He remained intubated postoperatively was transferred to the neurosurgery intensive care unit. Postoperatively he was maintained on the Neo-Synephrine tension and ventilator for nearly 5 days. During this period of time his Hemovac VAC drain was discontinued on postoperative day #1 he began showing some drainage from his incision site on postoperative day #2 and a VAC incisional dressing was applied on postoperative day #3 with a Ancef IV. He was also placed on vancomycin and Zosyn during his stay in the intensive care unit for fever. Chest x-rays demonstrated bilateral pulmonary infiltrates consistent with congestion versus infection. Required a maximum PEEP of 8 mm initially but was eventually 5 mm PEEP. Foley catheter remained in place and central line was placed for measurement of patient's central venous pressures. On postoperative day #4 the patient self extubated himself while in the intensive care unit. He was given diuresis and maintained on face mask oxygen and was able to tolerate extubation. His ABGs improved and he was answered to surgical floor at 5 N. on postoperative day #5. Postoperatively he was given a steroid taper. On postoperative day #4 his hemoglobin found to be 6.1 and he was transfused 2 units of blood. His Neo-Synephrine drip was discontinued on postoperative day #4. He maintained a leukocytosis postoperatively due to a combination of steroids and the infection. An internal medicine consult was obtained and the patient was on a broad-spectrum antibiotic Levaquin and vancomycin and Zosyn and Ancef was discontinued. On postoperative day #7 patient's wound incision VAC  was discontinued as was the central line discontinued and he was allowed to shower with dressing in place. Probably showed significant drainage from the incision and on postoperative day #2 and 3 incision VAC discontinued the drainage showed no sign of significant drainage on postoperative day #7. Internal medicine consult recommended continuing Levaquin for a total of 8 days 5 days from the beginning of this medication on postoperative day #6. Patient is morbidly obese nearly 400 pounds and trick of bed was obtained however the bed to be too small. Physical therapy worked with the patient beginning on postoperative day #4 and 5 he progressed in a standing capacity walking with a walker and some mild weakness in the right lower extremity  exercises and right knee extension. He has chronic right extremity changes due to the posterior knee  soft tissue loss due to infection years ago. He has a fused right ankle. During his postoperative period the demonstrated agitation and he desired transferred to skilled nursing home. [facility] in the last year and a half and desired to transferred to nursing facility on postoperative day #7. The patient's discharge orders and prescriptions have been signed and we will await roll medicine's evaluation today as to whether or not we can consider transferred to skilled nursing facility on postoperative day #7. Patient has been maintained on Lovenox 40 milligrams subcutaneous since the postoperative day #4. At the time of discharge he is awake alert oriented 4 complaining of difficulties with sleep due to bed. He was afebrile and vital signs are stable. He will be transferred to skilled nursing facility at the time that he is deemed to be medically stable. Transferred to [facility] facility will be facilitated via ambulance. He was given sequential compression devices, early ambulation, and  chemoprophylaxis for DVT prophylaxis.  He benefited maximally from their hospital stay and there were no complications.    Recent vital signs:  Filed Vitals:   11/24/14 0437  BP: 142/65  Pulse: 74  Temp: 97.7 F (36.5 C)  Resp: 18    Recent laboratory studies:  Results for orders placed or performed during the hospital encounter of 11/17/14  C difficile quick scan w PCR reflex  Result Value Ref Range   C Diff antigen NEGATIVE NEGATIVE   C Diff toxin NEGATIVE NEGATIVE   C Diff interpretation Negative for toxigenic C. difficile   Culture, blood (routine x 2)  Result Value Ref Range   Specimen Description BLOOD LEFT ARM    Special Requests BOTTLES DRAWN AEROBIC AND ANAEROBIC 5CC    Culture NO GROWTH 3 DAYS    Report Status PENDING   Culture, blood (routine x 2)  Result Value Ref Range   Specimen Description BLOOD LEFT HAND    Special Requests BOTTLES DRAWN AEROBIC AND ANAEROBIC 5CC    Culture NO GROWTH 3 DAYS    Report Status PENDING   Urine culture  Result Value Ref Range   Specimen Description URINE, CATHETERIZED    Special Requests NONE    Culture NO GROWTH 1 DAY    Report Status 11/21/2014 FINAL   Culture, respiratory (NON-Expectorated)  Result Value Ref Range   Specimen Description TRACHEAL ASPIRATE    Special Requests NONE    Gram Stain      ABUNDANT WBC PRESENT, PREDOMINANTLY PMN NO SQUAMOUS EPITHELIAL CELLS SEEN NO ORGANISMS SEEN Performed at [facility] Performed at [facility]    Report Status 11/23/2014 FINAL    Organism ID, Bacteria ESCHERICHIA COLI       Susceptibility   Escherichia coli - MIC*    AMPICILLIN <=2 SENSITIVE Sensitive     AMPICILLIN/SULBACTAM <=2 SENSITIVE Sensitive     CEFEPIME <=1 SENSITIVE Sensitive     CEFTAZIDIME <=1 SENSITIVE Sensitive     CEFTRIAXONE <=1 SENSITIVE Sensitive     CIPROFLOXACIN <=0.25 SENSITIVE Sensitive     GENTAMICIN <=1 SENSITIVE Sensitive     IMIPENEM  <=0.25 SENSITIVE Sensitive     PIP/TAZO <=4 SENSITIVE Sensitive     TOBRAMYCIN <=1 SENSITIVE Sensitive     TRIMETH/SULFA Value  in next row Sensitive      <=20 SENSITIVE(NOTE)    * FEW ESCHERICHIA COLI  Glucose, capillary  Result Value Ref Range   Glucose-Capillary 151 (H) 65 - 99 mg/dL  CBC  Result Value Ref Range   WBC 21.2 (H) 4.0 - 10.5 K/uL   RBC 4.29 4.22 - 5.81 MIL/uL   Hemoglobin 9.8 (L) 13.0 - 17.0 g/dL   HCT 31.9 (L) 39.0 - 52.0 %   MCV 74.4 (L) 78.0 - 100.0 fL   MCH 22.8 (L) 26.0 - 34.0 pg   MCHC 30.7 30.0 - 36.0 g/dL   RDW 17.1 (H) 11.5 - 15.5 %   Platelets 160 150 - 400 K/uL  Basic Metabolic Panel  Result Value Ref Range   Sodium 137 135 - 145 mmol/L   Potassium 5.2 (H) 3.5 - 5.1 mmol/L   Chloride 103 101 - 111 mmol/L   CO2 22 22 - 32 mmol/L   Glucose, Bld 145 (H) 65 - 99 mg/dL   BUN 18 6 - 20 mg/dL   Creatinine, Ser 2.64 (H) 0.61 - 1.24 mg/dL   Calcium 6.8 (L) 8.9 - 10.3 mg/dL   GFR calc non Af Amer 25 (L) >60 mL/min   GFR calc Af Amer 29 (L) >60 mL/min   Anion gap 12 5 - 15  Protime-INR  Result Value Ref Range   Prothrombin Time 14.9 11.6 - 15.2 seconds   INR 1.16 0.00 - 1.49  APTT  Result Value Ref Range   aPTT 28 24 - 37 seconds  Hemoglobin A1c  Result Value Ref Range   Hgb A1c MFr Bld 7.4 (H) 4.8 - 5.6 %   Mean Plasma Glucose 166 mg/dL  Glucose, capillary  Result Value Ref Range   Glucose-Capillary 165 (H) 65 - 99 mg/dL  Triglycerides  Result Value Ref Range   Triglycerides 245 (H) <150 mg/dL  Basic metabolic panel  Result Value Ref Range   Sodium 136 135 - 145 mmol/L   Potassium 4.9 3.5 - 5.1 mmol/L   Chloride 100 (L) 101 - 111 mmol/L   CO2 23 22 - 32 mmol/L   Glucose, Bld 196 (H) 65 - 99 mg/dL   BUN 13 6 - 20 mg/dL   Creatinine, Ser 1.56 (H) 0.61 - 1.24 mg/dL   Calcium 7.3 (L) 8.9 - 10.3 mg/dL   GFR calc non Af Amer 48 (L) >60 mL/min   GFR calc Af Amer 56 (L) >60 mL/min   Anion gap 13 5 - 15  CBC  Result Value Ref Range   WBC 25.0  (H) 4.0 - 10.5 K/uL   RBC 4.74 4.22 - 5.81 MIL/uL   Hemoglobin 11.0 (L) 13.0 - 17.0 g/dL   HCT 35.5 (L) 39.0 - 52.0 %   MCV 74.9 (L) 78.0 - 100.0 fL   MCH 23.2 (L) 26.0 - 34.0 pg   MCHC 31.0 30.0 - 36.0 g/dL   RDW 16.8 (H) 11.5 - 15.5 %   Platelets 192 150 - 400 K/uL  Troponin I  Result Value Ref Range   Troponin I <0.03 <0.031 ng/mL  Glucose, capillary  Result Value Ref Range   Glucose-Capillary 197 (H) 65 - 99 mg/dL  Glucose, capillary  Result Value Ref Range   Glucose-Capillary 137 (H) 65 - 99 mg/dL  Glucose, capillary  Result Value Ref Range   Glucose-Capillary 159 (H) 65 - 99 mg/dL  Glucose, capillary  Result Value Ref Range   Glucose-Capillary 137 (H) 65 - 99  mg/dL  Blood gas, arterial  Result Value Ref Range   FIO2 0.35    Delivery systems VENTILATOR    VT 600 mL   LHR 28 resp/min   Peep/cpap 5.0 cm H20   pH, Arterial 7.326 (L) 7.350 - 7.450   pCO2 arterial 40.9 35.0 - 45.0 mmHg   pO2, Arterial 60.5 (L) 80.0 - 100.0 mmHg   Bicarbonate 20.6 20.0 - 24.0 mEq/L   TCO2 21.9 0 - 100 mmol/L   Acid-base deficit 4.3 (H) 0.0 - 2.0 mmol/L   O2 Saturation 88.3 %   Patient temperature 99.3    Collection site RIGHT RADIAL    Drawn by [phone]    Sample type ARTERIAL DRAW    Allens test (pass/fail) PASS PASS  Glucose, capillary  Result Value Ref Range   Glucose-Capillary 142 (H) 65 - 99 mg/dL  CBC  Result Value Ref Range   WBC 35.0 (H) 4.0 - 10.5 K/uL   RBC 3.48 (L) 4.22 - 5.81 MIL/uL   Hemoglobin 8.0 (L) 13.0 - 17.0 g/dL   HCT 26.0 (L) 39.0 - 52.0 %   MCV 74.7 (L) 78.0 - 100.0 fL   MCH 23.0 (L) 26.0 - 34.0 pg   MCHC 30.8 30.0 - 36.0 g/dL   RDW 17.5 (H) 11.5 - 15.5 %   Platelets 162 150 - 400 K/uL  Basic metabolic panel  Result Value Ref Range   Sodium 136 135 - 145 mmol/L   Potassium 4.8 3.5 - 5.1 mmol/L   Chloride 105 101 - 111 mmol/L   CO2 20 (L) 22 - 32 mmol/L   Glucose, Bld 157 (H) 65 - 99 mg/dL   BUN 29 (H) 6 - 20 mg/dL   Creatinine, Ser 3.88 (H) 0.61 -  1.24 mg/dL   Calcium 5.6 (LL) 8.9 - 10.3 mg/dL   GFR calc non Af Amer 16 (L) >60 mL/min   GFR calc Af Amer 19 (L) >60 mL/min   Anion gap 11 5 - 15  Magnesium  Result Value Ref Range   Magnesium 1.1 (L) 1.7 - 2.4 mg/dL  Phosphorus  Result Value Ref Range   Phosphorus 4.9 (H) 2.5 - 4.6 mg/dL  Blood gas, arterial  Result Value Ref Range   FIO2 0.50    Delivery systems VENTILATOR    Mode PRESSURE REGULATED VOLUME CONTROL    VT 600 mL   LHR 28 resp/min   Peep/cpap 5.0 cm H20   pH, Arterial 7.319 (L) 7.350 - 7.450   pCO2 arterial 38.7 35.0 - 45.0 mmHg   pO2, Arterial 76.0 (L) 80.0 - 100.0 mmHg   Bicarbonate 19.3 (L) 20.0 - 24.0 mEq/L   TCO2 20.5 0 - 100 mmol/L   Acid-base deficit 5.7 (H) 0.0 - 2.0 mmol/L   O2 Saturation 93.2 %   Patient temperature 98.6    Collection site RIGHT RADIAL    Drawn by [phone]    Sample type ARTERIAL    Allens test (pass/fail) PASS PASS  Glucose, capillary  Result Value Ref Range   Glucose-Capillary 157 (H) 65 - 99 mg/dL  CBC  Result Value Ref Range   WBC 31.0 (H) 4.0 - 10.5 K/uL   RBC 3.46 (L) 4.22 - 5.81 MIL/uL   Hemoglobin 7.9 (L) 13.0 - 17.0 g/dL   HCT 25.8 (L) 39.0 - 52.0 %   MCV 74.6 (L) 78.0 - 100.0 fL   MCH 22.8 (L) 26.0 - 34.0 pg   MCHC 30.6 30.0 - 36.0 g/dL  RDW 17.3 (H) 11.5 - 15.5 %   Platelets 147 (L) 150 - 400 K/uL  Glucose, capillary  Result Value Ref Range   Glucose-Capillary 133 (H) 65 - 99 mg/dL  Albumin  Result Value Ref Range   Albumin 2.2 (L) 3.5 - 5.0 g/dL  Glucose, capillary  Result Value Ref Range   Glucose-Capillary 141 (H) 65 - 99 mg/dL  Glucose, capillary  Result Value Ref Range   Glucose-Capillary 153 (H) 65 - 99 mg/dL  Glucose, capillary  Result Value Ref Range   Glucose-Capillary 164 (H) 65 - 99 mg/dL  Glucose, capillary  Result Value Ref Range   Glucose-Capillary 161 (H) 65 - 99 mg/dL  Triglycerides  Result Value Ref Range   Triglycerides 262 (H) <150 mg/dL  CBC  Result Value Ref Range   WBC 23.6  (H) 4.0 - 10.5 K/uL   RBC 3.00 (L) 4.22 - 5.81 MIL/uL   Hemoglobin 6.7 (LL) 13.0 - 17.0 g/dL   HCT 22.6 (L) 39.0 - 52.0 %   MCV 75.3 (L) 78.0 - 100.0 fL   MCH 22.3 (L) 26.0 - 34.0 pg   MCHC 29.6 (L) 30.0 - 36.0 g/dL   RDW 17.6 (H) 11.5 - 15.5 %   Platelets 154 150 - 400 K/uL  Basic metabolic panel  Result Value Ref Range   Sodium 138 135 - 145 mmol/L   Potassium 4.2 3.5 - 5.1 mmol/L   Chloride 103 101 - 111 mmol/L   CO2 23 22 - 32 mmol/L   Glucose, Bld 165 (H) 65 - 99 mg/dL   BUN 43 (H) 6 - 20 mg/dL   Creatinine, Ser 2.56 (H) 0.61 - 1.24 mg/dL   Calcium 5.5 (LL) 8.9 - 10.3 mg/dL   GFR calc non Af Amer 26 (L) >60 mL/min   GFR calc Af Amer 31 (L) >60 mL/min   Anion gap 12 5 - 15  Magnesium  Result Value Ref Range   Magnesium 1.8 1.7 - 2.4 mg/dL  Phosphorus  Result Value Ref Range   Phosphorus 3.4 2.5 - 4.6 mg/dL  Blood gas, arterial  Result Value Ref Range   FIO2 0.70    Delivery systems VENTILATOR    Mode PRESSURE REGULATED VOLUME CONTROL    VT 600 mL   LHR 28 resp/min   Peep/cpap 5.0 cm H20   pH, Arterial 7.307 (L) 7.350 - 7.450   pCO2 arterial 43.0 35.0 - 45.0 mmHg   pO2, Arterial 54.3 (L) 80.0 - 100.0 mmHg   Bicarbonate 20.9 20.0 - 24.0 mEq/L   TCO2 22.2 0 - 100 mmol/L   Acid-base deficit 4.4 (H) 0.0 - 2.0 mmol/L   O2 Saturation 84.4 %   Patient temperature 98.6    Collection site RIGHT RADIAL    Drawn by [phone]    Sample type ARTERIAL DRAW    Allens test (pass/fail) PASS PASS  Glucose, capillary  Result Value Ref Range   Glucose-Capillary 153 (H) 65 - 99 mg/dL  Cortisol-am, blood  Result Value Ref Range   Cortisol - AM 19.2 6.7 - 22.6 ug/dL  Glucose, capillary  Result Value Ref Range   Glucose-Capillary 164 (H) 65 - 99 mg/dL  Glucose, capillary  Result Value Ref Range   Glucose-Capillary 110 (H) 65 - 99 mg/dL  Glucose, capillary  Result Value Ref Range   Glucose-Capillary 174 (H) 65 - 99 mg/dL  Glucose, capillary  Result Value Ref Range    Glucose-Capillary 158 (H) 65 - 99 mg/dL  Glucose,  capillary  Result Value Ref Range   Glucose-Capillary 160 (H) 65 - 99 mg/dL   Comment 1 Notify RN    Comment 2 Document in Chart   Urinalysis, Routine w reflex microscopic (not at [facility])  Result Value Ref Range   Color, Urine YELLOW YELLOW   APPearance TURBID (A) CLEAR   Specific Gravity, Urine 1.020 1.005 - 1.030   pH 5.5 5.0 - 8.0   Glucose, UA NEGATIVE NEGATIVE mg/dL   Hgb urine dipstick LARGE (A) NEGATIVE   Bilirubin Urine NEGATIVE NEGATIVE   Ketones, ur NEGATIVE NEGATIVE mg/dL   Protein, ur 30 (A) NEGATIVE mg/dL   Urobilinogen, UA 0.2 0.0 - 1.0 mg/dL   Nitrite NEGATIVE NEGATIVE   Leukocytes, UA TRACE (A) NEGATIVE  Urine microscopic-add on  Result Value Ref Range   Squamous Epithelial / LPF RARE RARE   WBC, UA 3-6 <3 WBC/hpf   RBC / HPF 7-10 <3 RBC/hpf   Bacteria, UA FEW (A) RARE   Urine-Other AMORPHOUS URATES/PHOSPHATES   Glucose, capillary  Result Value Ref Range   Glucose-Capillary 184 (H) 65 - 99 mg/dL   Comment 1 Notify RN    Comment 2 Document in Chart   Glucose, capillary  Result Value Ref Range   Glucose-Capillary 212 (H) 65 - 99 mg/dL   Comment 1 Notify RN    Comment 2 Document in Chart   CBC  Result Value Ref Range   WBC 20.9 (H) 4.0 - 10.5 K/uL   RBC 3.41 (L) 4.22 - 5.81 MIL/uL   Hemoglobin 8.0 (L) 13.0 - 17.0 g/dL   HCT 25.6 (L) 39.0 - 52.0 %   MCV 75.1 (L) 78.0 - 100.0 fL   MCH 23.5 (L) 26.0 - 34.0 pg   MCHC 31.3 30.0 - 36.0 g/dL   RDW 17.5 (H) 11.5 - 15.5 %   Platelets 191 150 - 400 K/uL  Basic metabolic panel  Result Value Ref Range   Sodium 144 135 - 145 mmol/L   Potassium 3.9 3.5 - 5.1 mmol/L   Chloride 106 101 - 111 mmol/L   CO2 28 22 - 32 mmol/L   Glucose, Bld 193 (H) 65 - 99 mg/dL   BUN 36 (H) 6 - 20 mg/dL   Creatinine, Ser 1.45 (H) 0.61 - 1.24 mg/dL   Calcium 6.2 (LL) 8.9 - 10.3 mg/dL   GFR calc non Af Amer 52 (L) >60 mL/min   GFR calc Af Amer >60 >60 mL/min   Anion gap 10 5 - 15    Magnesium  Result Value Ref Range   Magnesium 2.2 1.7 - 2.4 mg/dL  Phosphorus  Result Value Ref Range   Phosphorus 2.1 (L) 2.5 - 4.6 mg/dL  Glucose, capillary  Result Value Ref Range   Glucose-Capillary 172 (H) 65 - 99 mg/dL  Glucose, capillary  Result Value Ref Range   Glucose-Capillary 181 (H) 65 - 99 mg/dL  Glucose, capillary  Result Value Ref Range   Glucose-Capillary 195 (H) 65 - 99 mg/dL  Glucose, capillary  Result Value Ref Range   Glucose-Capillary 126 (H) 65 - 99 mg/dL  Glucose, capillary  Result Value Ref Range   Glucose-Capillary 189 (H) 65 - 99 mg/dL  Glucose, capillary  Result Value Ref Range   Glucose-Capillary 177 (H) 65 - 99 mg/dL  CBC  Result Value Ref Range   WBC 24.1 (H) 4.0 - 10.5 K/uL   RBC 3.55 (L) 4.22 - 5.81 MIL/uL   Hemoglobin 8.2 (L) 13.0 - 17.0 g/dL  HCT 27.0 (L) 39.0 - 52.0 %   MCV 76.1 (L) 78.0 - 100.0 fL   MCH 23.1 (L) 26.0 - 34.0 pg   MCHC 30.4 30.0 - 36.0 g/dL   RDW 17.5 (H) 11.5 - 15.5 %   Platelets 204 150 - 400 K/uL  Basic metabolic panel  Result Value Ref Range   Sodium 141 135 - 145 mmol/L   Potassium 3.9 3.5 - 5.1 mmol/L   Chloride 98 (L) 101 - 111 mmol/L   CO2 32 22 - 32 mmol/L   Glucose, Bld 169 (H) 65 - 99 mg/dL   BUN 26 (H) 6 - 20 mg/dL   Creatinine, Ser 0.97 0.61 - 1.24 mg/dL   Calcium 6.5 (L) 8.9 - 10.3 mg/dL   GFR calc non Af Amer >60 >60 mL/min   GFR calc Af Amer >60 >60 mL/min   Anion gap 11 5 - 15  Magnesium  Result Value Ref Range   Magnesium 2.0 1.7 - 2.4 mg/dL  Phosphorus  Result Value Ref Range   Phosphorus 3.2 2.5 - 4.6 mg/dL  Glucose, capillary  Result Value Ref Range   Glucose-Capillary 121 (H) 65 - 99 mg/dL  Glucose, capillary  Result Value Ref Range   Glucose-Capillary 167 (H) 65 - 99 mg/dL  Glucose, capillary  Result Value Ref Range   Glucose-Capillary 157 (H) 65 - 99 mg/dL  Glucose, capillary  Result Value Ref Range   Glucose-Capillary 140 (H) 65 - 99 mg/dL  Glucose, capillary  Result  Value Ref Range   Glucose-Capillary 188 (H) 65 - 99 mg/dL  Glucose, capillary  Result Value Ref Range   Glucose-Capillary 176 (H) 65 - 99 mg/dL  CBC  Result Value Ref Range   WBC 21.1 (H) 4.0 - 10.5 K/uL   RBC 3.68 (L) 4.22 - 5.81 MIL/uL   Hemoglobin 8.4 (L) 13.0 - 17.0 g/dL   HCT 27.8 (L) 39.0 - 52.0 %   MCV 75.5 (L) 78.0 - 100.0 fL   MCH 22.8 (L) 26.0 - 34.0 pg   MCHC 30.2 30.0 - 36.0 g/dL   RDW 17.5 (H) 11.5 - 15.5 %   Platelets 262 150 - 400 K/uL  Basic metabolic panel  Result Value Ref Range   Sodium 139 135 - 145 mmol/L   Potassium 3.4 (L) 3.5 - 5.1 mmol/L   Chloride 97 (L) 101 - 111 mmol/L   CO2 31 22 - 32 mmol/L   Glucose, Bld 188 (H) 65 - 99 mg/dL   BUN 21 (H) 6 - 20 mg/dL   Creatinine, Ser 0.91 0.61 - 1.24 mg/dL   Calcium 6.9 (L) 8.9 - 10.3 mg/dL   GFR calc non Af Amer >60 >60 mL/min   GFR calc Af Amer >60 >60 mL/min   Anion gap 11 5 - 15  Magnesium  Result Value Ref Range   Magnesium 1.9 1.7 - 2.4 mg/dL  Phosphorus  Result Value Ref Range   Phosphorus 2.0 (L) 2.5 - 4.6 mg/dL  Glucose, capillary  Result Value Ref Range   Glucose-Capillary 142 (H) 65 - 99 mg/dL  Glucose, capillary  Result Value Ref Range   Glucose-Capillary 142 (H) 65 - 99 mg/dL  Glucose, capillary  Result Value Ref Range   Glucose-Capillary 182 (H) 65 - 99 mg/dL  Glucose, capillary  Result Value Ref Range   Glucose-Capillary 165 (H) 65 - 99 mg/dL  Glucose, capillary  Result Value Ref Range   Glucose-Capillary 158 (H) 65 - 99 mg/dL  Glucose,  capillary  Result Value Ref Range   Glucose-Capillary 199 (H) 65 - 99 mg/dL  Basic metabolic panel  Result Value Ref Range   Sodium 139 135 - 145 mmol/L   Potassium 3.0 (L) 3.5 - 5.1 mmol/L   Chloride 97 (L) 101 - 111 mmol/L   CO2 32 22 - 32 mmol/L   Glucose, Bld 138 (H) 65 - 99 mg/dL   BUN 16 6 - 20 mg/dL   Creatinine, Ser 0.80 0.61 - 1.24 mg/dL   Calcium 7.0 (L) 8.9 - 10.3 mg/dL   GFR calc non Af Amer >60 >60 mL/min   GFR calc Af Amer >60  >60 mL/min   Anion gap 10 5 - 15  CBC  Result Value Ref Range   WBC 17.9 (H) 4.0 - 10.5 K/uL   RBC 3.71 (L) 4.22 - 5.81 MIL/uL   Hemoglobin 8.6 (L) 13.0 - 17.0 g/dL   HCT 28.1 (L) 39.0 - 52.0 %   MCV 75.7 (L) 78.0 - 100.0 fL   MCH 23.2 (L) 26.0 - 34.0 pg   MCHC 30.6 30.0 - 36.0 g/dL   RDW 17.5 (H) 11.5 - 15.5 %   Platelets 306 150 - 400 K/uL  Phosphorus  Result Value Ref Range   Phosphorus 2.0 (L) 2.5 - 4.6 mg/dL  Magnesium  Result Value Ref Range   Magnesium 2.0 1.7 - 2.4 mg/dL  Glucose, capillary  Result Value Ref Range   Glucose-Capillary 130 (H) 65 - 99 mg/dL  Glucose, capillary  Result Value Ref Range   Glucose-Capillary 142 (H) 65 - 99 mg/dL  Glucose, capillary  Result Value Ref Range   Glucose-Capillary 148 (H) 65 - 99 mg/dL  Glucose, capillary  Result Value Ref Range   Glucose-Capillary 142 (H) 65 - 99 mg/dL  I-STAT 4, (NA,K, GLUC, HGB,HCT)  Result Value Ref Range   Sodium 138 135 - 145 mmol/L   Potassium 4.3 3.5 - 5.1 mmol/L   Glucose, Bld 157 (H) 65 - 99 mg/dL   HCT 32.0 (L) 39.0 - 52.0 %   Hemoglobin 10.9 (L) 13.0 - 17.0 g/dL  I-STAT 4, (NA,K, GLUC, HGB,HCT)  Result Value Ref Range   Sodium 137 135 - 145 mmol/L   Potassium 4.3 3.5 - 5.1 mmol/L   Glucose, Bld 171 (H) 65 - 99 mg/dL   HCT 32.0 (L) 39.0 - 52.0 %   Hemoglobin 10.9 (L) 13.0 - 17.0 g/dL  I-STAT 4, (NA,K, GLUC, HGB,HCT)  Result Value Ref Range   Sodium 138 135 - 145 mmol/L   Potassium 4.6 3.5 - 5.1 mmol/L   Glucose, Bld 159 (H) 65 - 99 mg/dL   HCT 34.0 (L) 39.0 - 52.0 %   Hemoglobin 11.6 (L) 13.0 - 17.0 g/dL  I-STAT 4, (NA,K, GLUC, HGB,HCT)  Result Value Ref Range   Sodium 137 135 - 145 mmol/L   Potassium 5.3 (H) 3.5 - 5.1 mmol/L   Glucose, Bld 162 (H) 65 - 99 mg/dL   HCT 33.0 (L) 39.0 - 52.0 %   Hemoglobin 11.2 (L) 13.0 - 17.0 g/dL  I-STAT 4, (NA,K, GLUC, HGB,HCT)  Result Value Ref Range   Sodium 137 135 - 145 mmol/L   Potassium 5.7 (H) 3.5 - 5.1 mmol/L   Glucose, Bld 152 (H) 65 - 99  mg/dL   HCT 36.0 (L) 39.0 - 52.0 %   Hemoglobin 12.2 (L) 13.0 - 17.0 g/dL  I-STAT 7, (LYTES, BLD GAS, ICA, H+H)  Result Value Ref Range  pH, Arterial 7.204 (L) 7.350 - 7.450   pCO2 arterial 66.3 (HH) 35.0 - 45.0 mmHg   pO2, Arterial 86.0 80.0 - 100.0 mmHg   Bicarbonate 26.2 (H) 20.0 - 24.0 mEq/L   TCO2 28 0 - 100 mmol/L   O2 Saturation 94.0 %   Acid-base deficit 3.0 (H) 0.0 - 2.0 mmol/L   Sodium 137 135 - 145 mmol/L   Potassium 5.6 (H) 3.5 - 5.1 mmol/L   Calcium, Ion 1.06 (L) 1.12 - 1.23 mmol/L   HCT 34.0 (L) 39.0 - 52.0 %   Hemoglobin 11.6 (L) 13.0 - 17.0 g/dL   Patient temperature 37.0 C    Sample type ARTERIAL    Comment VALUES EXPECTED, NO REPEAT   I-STAT 3, arterial blood gas (G3+)  Result Value Ref Range   pH, Arterial 7.195 (LL) 7.350 - 7.450   pCO2 arterial 63.0 (HH) 35.0 - 45.0 mmHg   pO2, Arterial 80.0 80.0 - 100.0 mmHg   Bicarbonate 24.3 (H) 20.0 - 24.0 mEq/L   TCO2 26 0 - 100 mmol/L   O2 Saturation 92.0 %   Acid-base deficit 5.0 (H) 0.0 - 2.0 mmol/L   Patient temperature 98.7 F    Collection site RADIAL, ALLEN'S TEST ACCEPTABLE    Drawn by Operator    Sample type ARTERIAL    Comment NOTIFIED PHYSICIAN   I-STAT 3, arterial blood gas (G3+)  Result Value Ref Range   pH, Arterial 7.261 (L) 7.350 - 7.450   pCO2 arterial 48.4 (H) 35.0 - 45.0 mmHg   pO2, Arterial 72.0 (L) 80.0 - 100.0 mmHg   Bicarbonate 21.8 20.0 - 24.0 mEq/L   TCO2 23 0 - 100 mmol/L   O2 Saturation 91.0 %   Acid-base deficit 5.0 (H) 0.0 - 2.0 mmol/L   Patient temperature 98.7 F    Collection site RADIAL, ALLEN'S TEST ACCEPTABLE    Drawn by Operator    Sample type ARTERIAL   I-STAT 3, arterial blood gas (G3+)  Result Value Ref Range   pH, Arterial 7.297 (L) 7.350 - 7.450   pCO2 arterial 48.4 (H) 35.0 - 45.0 mmHg   pO2, Arterial 74.0 (L) 80.0 - 100.0 mmHg   Bicarbonate 23.5 20.0 - 24.0 mEq/L   TCO2 25 0 - 100 mmol/L   O2 Saturation 92.0 %   Acid-base deficit 3.0 (H) 0.0 - 2.0 mmol/L    Patient temperature 99.5 F    Collection site RADIAL, ALLEN'S TEST ACCEPTABLE    Drawn by Operator    Sample type ARTERIAL   I-STAT 3, arterial blood gas (G3+)  Result Value Ref Range   pH, Arterial 7.420 7.350 - 7.450   pCO2 arterial 38.5 35.0 - 45.0 mmHg   pO2, Arterial 41.0 (L) 80.0 - 100.0 mmHg   Bicarbonate 24.8 (H) 20.0 - 24.0 mEq/L   TCO2 26 0 - 100 mmol/L   O2 Saturation 75.0 %   Acid-Base Excess 1.0 0.0 - 2.0 mmol/L   Patient temperature 99.7 F    Collection site RADIAL, ALLEN'S TEST ACCEPTABLE    Drawn by RT    Sample type ARTERIAL    Comment MD NOTIFIED, SUGGEST RECOLLECT   I-STAT 3, arterial blood gas (G3+)  Result Value Ref Range   pH, Arterial 7.402 7.350 - 7.450   pCO2 arterial 42.4 35.0 - 45.0 mmHg   pO2, Arterial 102.0 (H) 80.0 - 100.0 mmHg   Bicarbonate 26.4 (H) 20.0 - 24.0 mEq/L   TCO2 28 0 - 100 mmol/L  O2 Saturation 98.0 %   Acid-Base Excess 1.0 0.0 - 2.0 mmol/L   Patient temperature 98.7 F    Collection site RADIAL, ALLEN'S TEST ACCEPTABLE    Drawn by RT    Sample type ARTERIAL   I-STAT 3, arterial blood gas (G3+)  Result Value Ref Range   pH, Arterial 7.331 (L) 7.350 - 7.450   pCO2 arterial 53.8 (H) 35.0 - 45.0 mmHg   pO2, Arterial 61.0 (L) 80.0 - 100.0 mmHg   Bicarbonate 28.2 (H) 20.0 - 24.0 mEq/L   TCO2 30 0 - 100 mmol/L   O2 Saturation 88.0 %   Acid-Base Excess 2.0 0.0 - 2.0 mmol/L   Patient temperature 100.0 F    Collection site RADIAL, ALLEN'S TEST ACCEPTABLE    Drawn by Operator    Sample type ARTERIAL   I-STAT 3, arterial blood gas (G3+)  Result Value Ref Range   pH, Arterial 7.366 7.350 - 7.450   pCO2 arterial 53.3 (H) 35.0 - 45.0 mmHg   pO2, Arterial 79.0 (L) 80.0 - 100.0 mmHg   Bicarbonate 30.8 (H) 20.0 - 24.0 mEq/L   TCO2 32 0 - 100 mmol/L   O2 Saturation 95.0 %   Acid-Base Excess 4.0 (H) 0.0 - 2.0 mmol/L   Patient temperature 97.4 F    Collection site RADIAL, ALLEN'S TEST ACCEPTABLE    Drawn by RT    Sample type ARTERIAL     Prepare RBC  Result Value Ref Range   Order Confirmation ORDER PROCESSED BY BLOOD BANK     Discharge Medications:     Medication List    STOP taking these medications        morphine 15 MG 12 hr tablet  Commonly known as:  MS CONTIN      TAKE these medications        allopurinol 100 MG tablet  Commonly known as:  ZYLOPRIM  Take 100 mg by mouth 2 (two) times daily.     amLODipine 10 MG tablet  Commonly known as:  NORVASC  Take 10 mg by mouth every morning.     CALCIUM + D PO  Take 2 tablets by mouth daily.     clobetasol ointment 0.05 %  Commonly known as:  TEMOVATE  Apply 1 application topically daily as needed (for psoriasis= apply to whole body= mix with aquaphor ointment).     COSENTYX SENSOREADY PEN 150 MG/ML So[place]  Generic drug:  Secukinumab  Inject 300 mg into the skin every 30 (thirty) days.     DEXILANT 60 MG capsule  Generic drug:  dexlansoprazole  Take 60 mg by mouth daily.     DYMISTA 137-50 MCG/ACT Susp  Generic drug:  Azelastine-Fluticasone  Place 1 spray into the nose daily.     fexofenadine 180 MG tablet  Commonly known as:  ALLEGRA  Take 180 mg by mouth daily.     hydrOXYzine 50 MG tablet  Commonly known as:  ATARAX/VISTARIL  Take 2 tablets (100 mg total) by mouth 3 (three) times daily as needed (itching).     hydrOXYzine 50 MG tablet  Commonly known as:  ATARAX/VISTARIL  Take 2 tablets (100 mg total) by mouth 3 (three) times daily as needed for anxiety or itching (itching).     levofloxacin 750 MG tablet  Commonly known as:  LEVAQUIN  Take 1 tablet (750 mg total) by mouth daily.     losartan 50 MG tablet  Commonly known as:  COZAAR  Take 50 mg by  mouth every morning.     Magnesium 500 MG Tabs  Take 1,000 mg by mouth 3 (three) times daily.     Manganese 50 MG Tabs  Take 1 tablet by mouth every evening.     metFORMIN 1000 MG tablet  Commonly known as:  GLUCOPHAGE  Take 1,000 mg by mouth 2 (two) times daily with a meal.      mineral oil-hydrophilic petrolatum ointment  Apply 1 application topically daily as needed for dry skin (for psoriasis= apply to whole body= mix with clobetasol 0.05% ointment).     montelukast 10 MG tablet  Commonly known as:  SINGULAIR  Take 10 mg by mouth at bedtime.     oxycodone 30 MG immediate release tablet  Commonly known as:  ROXICODONE  Take 1 tablet (30 mg total) by mouth every 4 (four) hours as needed for pain.     QNASL 80 MCG/ACT Aers  Generic drug:  Beclomethasone Dipropionate  Place 1 spray into the nose 2 (two) times daily.     sertraline 25 MG tablet  Commonly known as:  ZOLOFT  Take 25 mg by mouth 2 (two) times daily.     temazepam 30 MG capsule  Commonly known as:  RESTORIL  Take 1 capsule (30 mg total) by mouth at bedtime as needed for sleep.     tiZANidine 4 MG tablet  Commonly known as:  ZANAFLEX  Take 4 mg by mouth 3 (three) times daily.     tiZANidine 2 MG tablet  Commonly known as:  ZANAFLEX  Take 1 tablet (2 mg total) by mouth 3 (three) times daily.     vitamin B-12 1000 MCG tablet  Commonly known as:  CYANOCOBALAMIN  Take 1,000 mcg by mouth daily.     vitamin C 500 MG tablet  Commonly known as:  ASCORBIC ACID  Take 500 mg by mouth daily.     zolpidem 10 MG tablet  Commonly known as:  AMBIEN  Take 10 mg by mouth at bedtime as needed for sleep.        Diagnostic Studies: Dg Chest 2 View  11/09/2014   CLINICAL DATA:  Preoperative lumbar fusion. Shortness of breath and cough  EXAM: CHEST  2 VIEW  COMPARISON:  Chest radiograph September 23, 2013; chest CT September 07, 2014  FINDINGS: There is mild bibasilar lung scarring. There is no edema or consolidation. Heart size and pulmonary vascularity are normal. No adenopathy is appreciable on radiographic examination. There is degenerative change in the thoracic spine.  IMPRESSION: No edema or consolidation.  Bibasilar lung scarring, stable.   Electronically Signed   By: Lowella Grip III M.D.   On:  11/09/2014 09:47   Dg Lumbar Spine 2-3 Views  11/17/2014   CLINICAL DATA:  Loosening of lumbar fusion hardware with pseudoarthrosis at multiple levels.  EXAM: DG C-ARM GT 120 MIN; LUMBAR SPINE - 2-3 VIEW  FLUOROSCOPY TIME:  Fluoroscopy Time (in minutes and seconds): 2 minutes 20 seconds  COMPARISON:  CT scan of the lumbar spine dated 05/06/2014  FINDINGS: AP and lateral C-arm images demonstrate revision of the posterior hardware with new iliac wing fixation screws.  IMPRESSION: Revision of lower spinal fusion.   Electronically Signed   By: Lorriane Shire M.D.   On: 11/17/2014 19:02   US Renal  11/20/2014   CLINICAL DATA:  Acute onset of renal failure.  Initial encounter.  EXAM: RENAL / URINARY TRACT ULTRASOUND COMPLETE  COMPARISON:  CT of the abdomen and  pelvis from 08/18/2014, and renal ultrasound performed 03/13/2013  FINDINGS: Right Kidney:  Length: 10.3 cm. Poorly visualized, though echogenicity is grossly unremarkable. No mass or hydronephrosis visualized.  Left Kidney:  Length: 10.8 cm. Poorly visualized, though echogenicity is grossly unremarkable. No mass or hydronephrosis visualized.  Bladder:  Not characterized on this study.  IMPRESSION: Significantly suboptimal study due to the patient's habitus and limitations in positioning. The kidneys are grossly unremarkable. No evidence of hydronephrosis.   Electronically Signed   By: Garald Balding M.D.   On: 11/20/2014 02:54   Dg Chest Port 1 View  11/22/2014   CLINICAL DATA:  Respiratory failure and recent extubation.  EXAM: PORTABLE CHEST 1 VIEW  COMPARISON:  11/21/2014  FINDINGS: Endotracheal tube is been removed. Nasogastric tube ill also has been removed. Left jugular central line tip in stable position at the origin of the SVC. Lungs show increase and interstitial edema since the prior study. No significant pleural fluid identified. The heart size is stable and mildly enlarged.  IMPRESSION: Increase in interstitial edema since the prior study.    Electronically Signed   By: Aletta Edouard M.D.   On: 11/22/2014 08:12   Dg Chest Port 1 View  11/21/2014   CLINICAL DATA:  Hx of ETT  EXAM: PORTABLE CHEST - 1 VIEW  COMPARISON:  the previous day's study  FINDINGS: Endotracheal tube, nasogastric tube, and left IJ central line are stable in position. Improved aeration with residual patchy perihilar and bibasilar airspace opacities left greater than right. Heart size upper limits normal for technique. No pneumothorax. No effusion. Visualized skeletal structures are unremarkable.  IMPRESSION: 1. Improved aeration with residual perihilar and bibasilar atelectasis or infiltrates. 2.  Support hardware stable in position.   Electronically Signed   By: Lucrezia Europe M.D.   On: 11/21/2014 09:04   Dg Chest Port 1 View  11/20/2014   CLINICAL DATA:  Acute renal failure  EXAM: PORTABLE CHEST - 1 VIEW  COMPARISON:  11/19/2014  FINDINGS: Cardiac shadow remains enlarged. Endotracheal tube and nasogastric catheter as well as a left jugular line are again seen and stable. Persistent atelectatic changes are noted in the right lung base although improved aeration is noted. No other focal abnormality is seen.  IMPRESSION: Improved but persistent atelectatic changes in the right base.  The remainder of the exam is stable.   Electronically Signed   By: Inez Catalina M.D.   On: 11/20/2014 07:35   Dg Chest Port 1 View  11/19/2014   CLINICAL DATA:  Central line placement.  EXAM: PORTABLE CHEST 1 VIEW  COMPARISON:  Earlier film, same date.  FINDINGS: The endotracheal tube is stable. An NG tube is coursing down the esophagus. A left IJ central venous catheter tip is in the mid SVC probably adjacent to the lateral wall of the SVC. No complicating features. Persistent cardiac enlargement and diffuse airspace process.  IMPRESSION: Support apparatus as discussed above.  Persistent cardiac enlargement and diffuse airspace process.   Electronically Signed   By: Marijo Sanes M.D.   On:  11/19/2014 10:10   Dg Chest Port 1 View  11/19/2014   CLINICAL DATA:  Hypoxia, respiratory failure, intubated patient.  EXAM: PORTABLE CHEST 1 VIEW  COMPARISON:  Portable chest x-ray of November 18, 2014  FINDINGS: The lungs are slightly less well inflated today. Pulmonary interstitial and alveolar opacities are more conspicuous bilaterally. The hemidiaphragms are largely obscured. The cardiac silhouette remains enlarged and the pulmonary vascularity engorged. The endotracheal tube  tip lies 3.3 cm above the carina.  IMPRESSION: Interval worsening of bilateral interstitial and alveolar opacities. There is pulmonary vascular congestion and cardiomegaly.   Electronically Signed   By: David  Martinique M.D.   On: 11/19/2014 07:43   Dg Chest Port 1 View  11/18/2014   CLINICAL DATA:  Hypoxia  EXAM: PORTABLE CHEST 1 VIEW  COMPARISON:  Study obtained earlier in the day  FINDINGS: Endotracheal tube tip is 9.8 cm above the carina with the tip at the level of T1-2. No pneumothorax. There is atelectatic change with small effusion at the right base. The lungs elsewhere clear. Heart is enlarged with pulmonary vascularity within normal limits. No adenopathy.  IMPRESSION: No tracheal tube is more superiorly located compared to earlier in the day with the tip 9.8 cm above the carina. No pneumothorax. There is a small right pleural effusion with right base atelectasis. Lungs elsewhere clear. Heart is prominent but stable.   Electronically Signed   By: Lowella Grip III M.D.   On: 11/18/2014 17:20   Dg Chest Port 1 View  11/18/2014   CLINICAL DATA:  Atelectasis.  EXAM: PORTABLE CHEST 1 VIEW  COMPARISON:  11/17/2014 .  FINDINGS: Endotracheal tube in stable position. Cardiomegaly. Normal pulmonary vascularity. Slight improvement of bibasilar atelectasis and/or infiltrates. No pleural effusion or pneumothorax.  IMPRESSION: 1. Endotracheal tube in good anatomic position. 2. Slight improvement of bibasilar atelectasis and/or  infiltrates. 3. Stable cardiomegaly.   Electronically Signed   By: [name]  Register   On: 11/18/2014 07:50   Dg Chest Port 1 View  11/17/2014   CLINICAL DATA:  Acute respiratory failure.  EXAM: PORTABLE CHEST 1 VIEW  COMPARISON:  November 09, 2014.  FINDINGS: Endotracheal tube is projected over tracheal air shadow with distal tip 5 cm above the carina. No pneumothorax is noted. Hypoinflation of the lungs is noted. Increased central pulmonary vascular congestion is noted. New right basilar opacity is noted most consistent with subsegmental atelectasis or less likely pneumonia. No significant pleural effusion is noted. Bony thorax is unremarkable.  IMPRESSION: Endotracheal tube in grossly good position. Hypoinflation of the lungs is noted. Increased central pulmonary vascular congestion is noted. Probable right basilar subsegmental atelectasis or pneumonia.   Electronically Signed   By: Marijo Conception, M.D.   On: 11/17/2014 20:54   Dg Abd Portable 1v  11/19/2014   CLINICAL DATA:  NG tube placement.  EXAM: PORTABLE ABDOMEN - 1 VIEW  COMPARISON:  CT scan 08/18/2014.  FINDINGS: The NG tube tip is in the antropyloric region of the stomach. Mild gaseous distention of the transverse colon is noted. Bibasilar atelectasis.  IMPRESSION: NG tube tip is in the antropyloric region of the stomach.   Electronically Signed   By: Marijo Sanes M.D.   On: 11/19/2014 10:39   Dg C-arm Gt 120 Min  11/17/2014   CLINICAL DATA:  Loosening of lumbar fusion hardware with pseudoarthrosis at multiple levels.  EXAM: DG C-ARM GT 120 MIN; LUMBAR SPINE - 2-3 VIEW  FLUOROSCOPY TIME:  Fluoroscopy Time (in minutes and seconds): 2 minutes 20 seconds  COMPARISON:  CT scan of the lumbar spine dated 05/06/2014  FINDINGS: AP and lateral C-arm images demonstrate revision of the posterior hardware with new iliac wing fixation screws.  IMPRESSION: Revision of lower spinal fusion.   Electronically Signed   By: Lorriane Shire M.D.   On: 11/17/2014  19:02    Disposition: 07-Left Against Medical Advice      Discharge Instructions  Call MD / Call 911    Complete by:  As directed   If you experience chest pain or shortness of breath, CALL 911 and be transported to the hospital emergency room.  If you develope a fever above 101 F, pus (white drainage) or increased drainage or redness at the wound, or calf pain, call your surgeon's office.     Constipation Prevention    Complete by:  As directed   Drink plenty of fluids.  Prune juice may be helpful.  You may use a stool softener, such as Colace (over the counter) 100 mg twice a day.  Use MiraLax (over the counter) for constipation as needed.     Diet Carb Modified    Complete by:  As directed      Discharge instructions    Complete by:  As directed   Call if there is increasing drainage, fever greater than 101.5, severe head aches, and worsening nausea or light sensitivity. If shortness of breath, bloody cough or chest tightness or pain go to an emergency room. No lifting greater than 10 lbs. Avoid bending, stooping and twisting. Use brace when sitting and out of bed even to go to bathroom. Walk in house for first 2 weeks then may start to get out slowly increasing distances up to one quarter mile by 4-6 weeks post op. After 5 days may shower and change dressing following bathing with shower.When bathing remove the brace shower and replace brace before getting out of the shower. If drainage, keep dry dressing and do not bathe the incision, use an moisture impervious dressing. Please call and return for scheduled follow up appointment 2 weeks from the time of surgery.     Driving restrictions    Complete by:  As directed   No driving for 4 weeks     Increase activity slowly as tolerated    Complete by:  As directed      Lifting restrictions    Complete by:  As directed   No lifting for 12 weeks           Follow-up Information    Follow up with Vern Guerette E, MD In 2 weeks.     Specialty:  Orthopedic Surgery   Contact information:   [place] [address] [phone]       Follow up with Velna Hatchet, MD In 2 weeks.   Specialty:  Internal Medicine   Why:  hypertension, diabetes, respiratory insufficiency, renal insufficiency, anemia post operative   Contact information:   [address] [place] [address] [phone]        Signed: Jessy Oto 11/24/2014, 9:20 AM

## 2014-11-24 NOTE — Progress Notes (Signed)
Called report to [employer], [place]. Answered all questions. RN informed me that patient was leaving shortly AMA.

## 2014-11-25 ENCOUNTER — Encounter (HOSPITAL_COMMUNITY): Payer: Self-pay | Admitting: Emergency Medicine

## 2014-11-25 ENCOUNTER — Emergency Department (HOSPITAL_COMMUNITY)
Admission: EM | Admit: 2014-11-25 | Discharge: 2014-11-25 | Disposition: A | Discharge status: Rehabilitation Facility | Payer: Medicare HMO | Attending: Emergency Medicine | Admitting: Emergency Medicine

## 2014-11-25 ENCOUNTER — Emergency Department (EMERGENCY_DEPARTMENT_HOSPITAL)
Admit: 2014-11-25 | Discharge: 2014-11-25 | Disposition: A | Discharge status: Home / Self Care | Payer: Medicare HMO | Attending: Emergency Medicine | Admitting: Emergency Medicine

## 2014-11-25 ENCOUNTER — Emergency Department (HOSPITAL_COMMUNITY): Payer: Medicare HMO

## 2014-11-25 DIAGNOSIS — M79604 Pain in right leg: Secondary | ICD-10-CM | POA: Insufficient documentation

## 2014-11-25 DIAGNOSIS — Z7951 Long term (current) use of inhaled steroids: Secondary | ICD-10-CM | POA: Insufficient documentation

## 2014-11-25 DIAGNOSIS — E119 Type 2 diabetes mellitus without complications: Secondary | ICD-10-CM | POA: Insufficient documentation

## 2014-11-25 DIAGNOSIS — F419 Anxiety disorder, unspecified: Secondary | ICD-10-CM | POA: Insufficient documentation

## 2014-11-25 DIAGNOSIS — K219 Gastro-esophageal reflux disease without esophagitis: Secondary | ICD-10-CM | POA: Insufficient documentation

## 2014-11-25 DIAGNOSIS — R609 Edema, unspecified: Secondary | ICD-10-CM

## 2014-11-25 DIAGNOSIS — Z872 Personal history of diseases of the skin and subcutaneous tissue: Secondary | ICD-10-CM | POA: Insufficient documentation

## 2014-11-25 DIAGNOSIS — M79609 Pain in unspecified limb: Secondary | ICD-10-CM

## 2014-11-25 DIAGNOSIS — Z9889 Other specified postprocedural states: Secondary | ICD-10-CM

## 2014-11-25 DIAGNOSIS — M545 Low back pain: Secondary | ICD-10-CM | POA: Insufficient documentation

## 2014-11-25 DIAGNOSIS — M1990 Unspecified osteoarthritis, unspecified site: Secondary | ICD-10-CM | POA: Diagnosis not present

## 2014-11-25 DIAGNOSIS — Z862 Personal history of diseases of the blood and blood-forming organs and certain disorders involving the immune mechanism: Secondary | ICD-10-CM | POA: Insufficient documentation

## 2014-11-25 DIAGNOSIS — Z79899 Other long term (current) drug therapy: Secondary | ICD-10-CM | POA: Diagnosis not present

## 2014-11-25 DIAGNOSIS — I10 Essential (primary) hypertension: Secondary | ICD-10-CM | POA: Diagnosis not present

## 2014-11-25 LAB — CBC WITH DIFFERENTIAL/PLATELET
Basophils Absolute: 0 10*3/uL (ref 0.0–0.1)
Basophils Relative: 0 %
Eosinophils Absolute: 0.2 10*3/uL (ref 0.0–0.7)
Eosinophils Relative: 1 %
HCT: 30.7 % — ABNORMAL LOW (ref 39.0–52.0)
Hemoglobin: 9.2 g/dL — ABNORMAL LOW (ref 13.0–17.0)
Lymphocytes Relative: 18 %
Lymphs Abs: 2.7 10*3/uL (ref 0.7–4.0)
MCH: 22.5 pg — ABNORMAL LOW (ref 26.0–34.0)
MCHC: 30 g/dL (ref 30.0–36.0)
MCV: 75.2 fL — ABNORMAL LOW (ref 78.0–100.0)
Monocytes Absolute: 1.5 10*3/uL — ABNORMAL HIGH (ref 0.1–1.0)
Monocytes Relative: 10 %
Neutro Abs: 10.7 10*3/uL — ABNORMAL HIGH (ref 1.7–7.7)
Neutrophils Relative %: 71 %
Platelets: 410 10*3/uL — ABNORMAL HIGH (ref 150–400)
RBC: 4.08 MIL/uL — ABNORMAL LOW (ref 4.22–5.81)
RDW: 17.3 % — ABNORMAL HIGH (ref 11.5–15.5)
WBC: 15.1 10*3/uL — ABNORMAL HIGH (ref 4.0–10.5)
nRBC: 4 /100 WBC — ABNORMAL HIGH

## 2014-11-25 LAB — I-STAT CHEM 8, ED
BUN: 6 mg/dL (ref 6–20)
Calcium, Ion: 0.95 mmol/L — ABNORMAL LOW (ref 1.12–1.23)
Chloride: 97 mmol/L — ABNORMAL LOW (ref 101–111)
Creatinine, Ser: 0.8 mg/dL (ref 0.61–1.24)
Glucose, Bld: 121 mg/dL — ABNORMAL HIGH (ref 65–99)
HEMATOCRIT: 32 % — AB (ref 39.0–52.0)
HEMOGLOBIN: 10.9 g/dL — AB (ref 13.0–17.0)
Potassium: 2.9 mmol/L — ABNORMAL LOW (ref 3.5–5.1)
SODIUM: 141 mmol/L (ref 135–145)
TCO2: 29 mmol/L (ref 0–100)

## 2014-11-25 LAB — CULTURE, BLOOD (ROUTINE X 2)
CULTURE: NO GROWTH
CULTURE: NO GROWTH

## 2014-11-25 MED ORDER — OXYCODONE-ACETAMINOPHEN 5-325 MG PO TABS
2.0000 | ORAL_TABLET | Freq: Once | ORAL | Status: AC
  Administered 2014-11-25: 2 via ORAL
  Filled 2014-11-25: qty 2

## 2014-11-25 MED ORDER — POTASSIUM CHLORIDE CRYS ER 20 MEQ PO TBCR
40.0000 meq | EXTENDED_RELEASE_TABLET | Freq: Once | ORAL | Status: AC
  Administered 2014-11-25: 40 meq via ORAL
  Filled 2014-11-25: qty 2

## 2014-11-25 NOTE — ED Notes (Signed)
[place] @ BS.

## 2014-11-25 NOTE — Progress Notes (Signed)
Pt informed ED CMs that his daughter called to inform him that he has been "accepted to [facility] and is picking me up to take me there tonight" Reports "my daughter said they called about four thirty"  Cm updated [place] staff member Octavia Bruckner

## 2014-11-25 NOTE — Progress Notes (Signed)
11/25/14 0000  CM Assessment  Expected Discharge [place]  In-house Referral Clinical Social Work  Discharge Planning Services CM Consult  [facility] Choice Home Health  Choice offered to / list presented to  Patient  DME [place] Arranged RN;PT;OT;Nurse's Aide;Social Work  [place]  Status of Service Completed, signed off  Discharge [place]   [facility] Reports he stayed there "twenty minutes" and left AMA. Reports he has been working since he arrived to his home to get to [place] [facility] but "[facility] is dragging [facility] feet" Pt agreed to [place] home health services to assist him in home until he can get to snf of his choice for short term rehab. Cm explained to the pt that when leaving any facility AMA that facility staff are not responsible for assisting with f/u services for him. He voiced understanding Pt walked in [facility] ED with ED nursing staff.   ED Cm confirmed pt has son and daughter who stay with him in his home  Pt states he has "all the equipment at home"  Gilford Rile,  cane, crutch w/c  Cm spoke with Octavia Bruckner of [facility] to provide referral  Cm discussed home health will contact pt on 11/26/14 Pt voiced understanding and states he will be safe at home Pt states he will be "on the phone as soon as I can tomorrow with [facility] and the nursing home" EDP, NP/PA  Updated Orders initiated

## 2014-11-25 NOTE — ED Provider Notes (Signed)
Complains of low back pain rating to right leg and right leg weakness for several days. Unchanged since discharge to skilled nursing facility from here yesterday. He left [facility] yesterday after discharge here stating conditions there were unsuitable. He was sent there for rehabilitation following back surgery .He returned home and finds that he is not functioning well at home.  Orlie Dakin, MD 11/25/14 1534

## 2014-11-25 NOTE — ED Notes (Signed)
Ultrasound at bedside at this time. There will be a delay in lab draw.

## 2014-11-25 NOTE — ED Notes (Signed)
Patient transported to X-ray

## 2014-11-25 NOTE — Progress Notes (Signed)
Pt confirms pcp as scott [facility]

## 2014-11-25 NOTE — Discharge Instructions (Signed)
No evidence of blood clot in your leg.  Please follow up with your doctor as previously scheduled.  Go to rehab facility for further care.   Back Exercises If you have pain in your back, do these exercises 2-3 times each day or as told by your doctor. When the pain goes away, do the exercises once each day, but repeat the steps more times for each exercise (do more repetitions). If you do not have pain in your back, do these exercises once each day or as told by your doctor. EXERCISES Single Knee to Chest Do these steps 3-5 times in a row for each leg: 1. Lie on your back on a firm bed or the floor with your legs stretched out. 2. Bring one knee to your chest. 3. Hold your knee to your chest by grabbing your knee or thigh. 4. Pull on your knee until you feel a gentle stretch in your lower back. 5. Keep doing the stretch for 10-30 seconds. 6. Slowly let go of your leg and straighten it. Pelvic Tilt Do these steps 5-10 times in a row: 1. Lie on your back on a firm bed or the floor with your legs stretched out. 2. Bend your knees so they point up to the ceiling. Your feet should be flat on the floor. 3. Tighten your lower belly (abdomen) muscles to press your lower back against the floor. This will make your tailbone point up to the ceiling instead of pointing down to your feet or the floor. 4. Stay in this position for 5-10 seconds while you gently tighten your muscles and breathe evenly. Cat-Cow Do these steps until your lower back bends more easily: 1. Get on your hands and knees on a firm surface. Keep your hands under your shoulders, and keep your knees under your hips. You may put padding under your knees. 2. Let your head hang down, and make your tailbone point down to the floor so your lower back is round like the back of a cat. 3. Stay in this position for 5 seconds. 4. Slowly lift your head and make your tailbone point up to the ceiling so your back hangs low (sags) like the back of a  cow. 5. Stay in this position for 5 seconds. Press-Ups Do these steps 5-10 times in a row: 1. Lie on your belly (face-down) on the floor. 2. Place your hands near your head, about shoulder-width apart. 3. While you keep your back relaxed and keep your hips on the floor, slowly straighten your arms to raise the top half of your body and lift your shoulders. Do not use your back muscles. To make yourself more comfortable, you may change where you place your hands. 4. Stay in this position for 5 seconds. 5. Slowly return to lying flat on the floor. Bridges Do these steps 10 times in a row: 1. Lie on your back on a firm surface. 2. Bend your knees so they point up to the ceiling. Your feet should be flat on the floor. 3. Tighten your butt muscles and lift your butt off of the floor until your waist is almost as high as your knees. If you do not feel the muscles working in your butt and the back of your thighs, slide your feet 1-2 inches farther away from your butt. 4. Stay in this position for 3-5 seconds. 5. Slowly lower your butt to the floor, and let your butt muscles relax. If this exercise is too easy, try  doing it with your arms crossed over your chest. Belly Crunches Do these steps 5-10 times in a row: 1. Lie on your back on a firm bed or the floor with your legs stretched out. 2. Bend your knees so they point up to the ceiling. Your feet should be flat on the floor. 3. Cross your arms over your chest. 4. Tip your chin a little bit toward your chest but do not bend your neck. 5. Tighten your belly muscles and slowly raise your chest just enough to lift your shoulder blades a tiny bit off of the floor. 6. Slowly lower your chest and your head to the floor. Back Lifts Do these steps 5-10 times in a row: 1. Lie on your belly (face-down) with your arms at your sides, and rest your forehead on the floor. 2. Tighten the muscles in your legs and your butt. 3. Slowly lift your chest off of  the floor while you keep your hips on the floor. Keep the back of your head in line with the curve in your back. Look at the floor while you do this. 4. Stay in this position for 3-5 seconds. 5. Slowly lower your chest and your face to the floor. GET HELP IF:  Your back pain gets a lot worse when you do an exercise.  Your back pain does not lessen 2 hours after you exercise. If you have any of these problems, stop doing the exercises. Do not do them again unless your doctor says it is okay. GET HELP RIGHT AWAY IF:  You have sudden, very bad back pain. If this happens, stop doing the exercises. Do not do them again unless your doctor says it is okay.   This information is not intended to replace advice given to you by your health care provider. Make sure you discuss any questions you have with your health care provider.   Document Released: 03/04/2010 Document Revised: 10/21/2014 Document Reviewed: 03/26/2014 Elsevier Interactive Patient Education [employer].

## 2014-11-25 NOTE — ED Notes (Signed)
Bed: CV89 Expected date:  Expected time:  Means of arrival:  Comments: EMS- back and leg pain/back surgery x 8 days ago

## 2014-11-25 NOTE — ED Notes (Signed)
Pt ambulated to the restroom with walker. Pt was out of breathe and having some difficulty, but pt made it to restroom.

## 2014-11-25 NOTE — ED Notes (Signed)
UNABLE TO COLLECT LABS AT THIS TIME PATIENT IS GETTING A ULTRASOUND.

## 2014-11-25 NOTE — ED Notes (Signed)
Pt states that the nursing facility he was "nasty, so i left there last night".  Pt states that he is out of his pain medications.  Pt states that when he left facility he didn't take any of his meds with him.

## 2014-11-25 NOTE — Progress Notes (Signed)
CSW attempted to speak with patient at bedside. However, he was not present. CSW will check back in with patient.  Willette Brace [phone] ED CSW 11/25/2014 4:47 PM

## 2014-11-25 NOTE — ED Notes (Signed)
Per EMS pt comes from home c/o lower back pain and right leg pain. Per EMS pt right leg has some swelling noted.  Pt had back surgery earlier this month, pt was sent to [facility] for rehab but pt left there AMA to home.  Pt lives at home with family but arent able to take care for pt.  Pt had multiple falls since going home.  Also EMS added that pt's surgical site has some bleeding.

## 2014-11-25 NOTE — ED Notes (Signed)
Family member questioning when pt will be d/c'd.  Informed will speak to provider.  Spoke with [place], PA & was informed awaiting [place] results.  Family member informed & verbalized understanding.

## 2014-11-25 NOTE — Progress Notes (Signed)
*  PRELIMINARY RESULTS* Vascular Ultrasound Right lower extremity venous duplex has been completed.  Preliminary findings: Technically limited due to body habitus. No obvious DVT noted in visualized veins.   Landry Mellow, RDMS, RVT  11/25/2014, 3:58 PM

## 2014-11-25 NOTE — ED Notes (Addendum)
1st attempt @ drawing labs. Unsuccessful. Will try again when pt returns from X-ray

## 2014-11-25 NOTE — ED Notes (Signed)
Social work and Tourist information centre manager now at bedside, further delay in blood draw.
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
## 2014-11-26 ENCOUNTER — Other Ambulatory Visit: Payer: Self-pay | Admitting: Allergy and Immunology

## 2014-11-26 MED ORDER — MONTELUKAST SODIUM 10 MG PO TABS: 10.0000 mg | ORAL_TABLET | Freq: Every day | ORAL | Status: DC

## 2014-11-26 NOTE — Telephone Encounter (Signed)
Received phone call from [place], pharmacist at [facility] on [address] [employer] [phone] concerning refill request faxed on 11/21/14,11/23/14.  Per Estill Bamberg at [facility] it was also faxed today 11/26/14.  Refilled via Epic.

## 2014-11-30 ENCOUNTER — Other Ambulatory Visit: Payer: Self-pay

## 2014-11-30 NOTE — Patient Outreach (Signed)
[facility]) Care Management  11/30/2014  MARCIAL PLESS [birth year] [id number]   Notification from Quinn Plowman, RN to close case due to patient is not eligible for [facility] Care Management services.  Thanks, Ronnell Freshwater. [place], [place] Assistant Phone: [phone] Fax: [phone]

## 2014-11-30 NOTE — Patient Outreach (Signed)
[facility]) Care Management  11/30/2014  Phillip Rocha [birth year] [id number]   Subjective: Referral per Phillip Rocha ED.  HIPAA verified with patient.    RNCM called patient's home number and spoke with person answering the phone who identified themselves as patient's son Phillip Rocha.  RNCM left HIPAA compliant message with patient's son, whom advised RNCM that patient was in a rehab facility and to contact patient via patient's cell phone.    RNCM called patient's cell phone and left HIPAA compliant message.   RNCM called [facility] and spoke with patient.   Patient states is doing much better and getting everything he needs at the facility.   Patient states he saw his primary MD Phillip Rocha approximately 2 weeks ago prior to his surgery.   Patient feels that he will need home health services upon discharge from [facility].    RNCM advised patient to follow up with facility discharge planner regarding this home health services needs.   Patient states he currently has [employer] and will be changing to a different insurance provider in January but is not sure which one.  Assessment: Patient ineligibility for [facility] services due non-eligible payor.  Plan:  RNCM will notify [facility] Del to close patient's case due to not eligible for benefit.   RNCM will notify patient's primary MD of case closure.     Phillip Plowman RN,BSN,CCM Phillip Rocha [phone]

## 2014-12-12 ENCOUNTER — Encounter (HOSPITAL_COMMUNITY): Payer: Self-pay | Admitting: Emergency Medicine

## 2014-12-12 ENCOUNTER — Emergency Department (HOSPITAL_COMMUNITY)
Admission: EM | Admit: 2014-12-12 | Discharge: 2014-12-12 | Disposition: A | Discharge status: Home / Self Care | Payer: Medicare HMO | Attending: Emergency Medicine | Admitting: Emergency Medicine

## 2014-12-12 DIAGNOSIS — M1990 Unspecified osteoarthritis, unspecified site: Secondary | ICD-10-CM | POA: Diagnosis not present

## 2014-12-12 DIAGNOSIS — Z792 Long term (current) use of antibiotics: Secondary | ICD-10-CM | POA: Diagnosis not present

## 2014-12-12 DIAGNOSIS — K219 Gastro-esophageal reflux disease without esophagitis: Secondary | ICD-10-CM | POA: Diagnosis not present

## 2014-12-12 DIAGNOSIS — I10 Essential (primary) hypertension: Secondary | ICD-10-CM | POA: Insufficient documentation

## 2014-12-12 DIAGNOSIS — Z872 Personal history of diseases of the skin and subcutaneous tissue: Secondary | ICD-10-CM | POA: Insufficient documentation

## 2014-12-12 DIAGNOSIS — Z79899 Other long term (current) drug therapy: Secondary | ICD-10-CM | POA: Diagnosis not present

## 2014-12-12 DIAGNOSIS — F419 Anxiety disorder, unspecified: Secondary | ICD-10-CM | POA: Diagnosis not present

## 2014-12-12 DIAGNOSIS — R1013 Epigastric pain: Secondary | ICD-10-CM

## 2014-12-12 DIAGNOSIS — E119 Type 2 diabetes mellitus without complications: Secondary | ICD-10-CM | POA: Diagnosis not present

## 2014-12-12 DIAGNOSIS — Z862 Personal history of diseases of the blood and blood-forming organs and certain disorders involving the immune mechanism: Secondary | ICD-10-CM | POA: Insufficient documentation

## 2014-12-12 DIAGNOSIS — R51 Headache: Secondary | ICD-10-CM | POA: Insufficient documentation

## 2014-12-12 DIAGNOSIS — Z7951 Long term (current) use of inhaled steroids: Secondary | ICD-10-CM | POA: Diagnosis not present

## 2014-12-12 DIAGNOSIS — Z9049 Acquired absence of other specified parts of digestive tract: Secondary | ICD-10-CM | POA: Insufficient documentation

## 2014-12-12 LAB — CBC
HCT: 28.6 % — ABNORMAL LOW (ref 39.0–52.0)
HEMOGLOBIN: 8.4 g/dL — AB (ref 13.0–17.0)
MCH: 21.8 pg — ABNORMAL LOW (ref 26.0–34.0)
MCHC: 29.4 g/dL — ABNORMAL LOW (ref 30.0–36.0)
MCV: 74.1 fL — AB (ref 78.0–100.0)
Platelets: 388 10*3/uL (ref 150–400)
RBC: 3.86 MIL/uL — AB (ref 4.22–5.81)
RDW: 16.7 % — ABNORMAL HIGH (ref 11.5–15.5)
WBC: 9.3 10*3/uL (ref 4.0–10.5)

## 2014-12-12 LAB — URINALYSIS, ROUTINE W REFLEX MICROSCOPIC
Bilirubin Urine: NEGATIVE
Glucose, UA: NEGATIVE mg/dL
Hgb urine dipstick: NEGATIVE
Ketones, ur: NEGATIVE mg/dL
LEUKOCYTES UA: NEGATIVE
NITRITE: NEGATIVE
PH: 8.5 — AB (ref 5.0–8.0)
Protein, ur: NEGATIVE mg/dL
SPECIFIC GRAVITY, URINE: 1.015 (ref 1.005–1.030)
UROBILINOGEN UA: 0.2 mg/dL (ref 0.0–1.0)

## 2014-12-12 LAB — COMPREHENSIVE METABOLIC PANEL
ALT: 10 U/L — ABNORMAL LOW (ref 17–63)
AST: 19 U/L (ref 15–41)
Albumin: 3.3 g/dL — ABNORMAL LOW (ref 3.5–5.0)
Alkaline Phosphatase: 93 U/L (ref 38–126)
Anion gap: 8 (ref 5–15)
BUN: 7 mg/dL (ref 6–20)
CO2: 30 mmol/L (ref 22–32)
Calcium: 8.8 mg/dL — ABNORMAL LOW (ref 8.9–10.3)
Chloride: 100 mmol/L — ABNORMAL LOW (ref 101–111)
Creatinine, Ser: 0.97 mg/dL (ref 0.61–1.24)
GFR calc Af Amer: >60 mL/min (ref >60)
GFR calc non Af Amer: >60 mL/min (ref >60)
Glucose, Bld: 113 mg/dL — ABNORMAL HIGH (ref 65–99)
Potassium: 4 mmol/L (ref 3.5–5.1)
Sodium: 138 mmol/L (ref 135–145)
Total Bilirubin: 0.5 mg/dL (ref 0.3–1.2)
Total Protein: 7.5 g/dL (ref 6.5–8.1)

## 2014-12-12 LAB — LIPASE, BLOOD: LIPASE: 33 U/L (ref 11–51)

## 2014-12-12 MED ORDER — FENTANYL CITRATE (PF) 100 MCG/2ML IJ SOLN
100.0000 ug | Freq: Once | INTRAMUSCULAR | Status: AC
  Administered 2014-12-12: 100 ug via INTRAVENOUS
  Filled 2014-12-12: qty 2

## 2014-12-12 MED ORDER — SODIUM CHLORIDE 0.9 % IV BOLUS (SEPSIS)
500.0000 mL | Freq: Once | INTRAVENOUS | Status: AC
  Administered 2014-12-12: 500 mL via INTRAVENOUS

## 2014-12-12 MED ORDER — RANITIDINE HCL 150 MG/10ML PO SYRP
150.0000 mg | ORAL_SOLUTION | Freq: Once | ORAL | Status: AC
  Administered 2014-12-12: 150 mg via ORAL
  Filled 2014-12-12: qty 10

## 2014-12-12 MED ORDER — RANITIDINE HCL 150 MG PO CAPS
150.0000 mg | ORAL_CAPSULE | Freq: Two times a day (BID) | ORAL | Status: DC
Start: 2014-12-12 — End: 2015-07-09

## 2014-12-12 MED ORDER — GI COCKTAIL ~~LOC~~
30.0000 mL | Freq: Once | ORAL | Status: AC
  Administered 2014-12-12: 30 mL via ORAL
  Filled 2014-12-12: qty 30

## 2014-12-12 NOTE — ED Notes (Signed)
Per EMS: Pt c/o upper abd pain x 5 days with headache.  Pain and tenderness upon palpation.

## 2014-12-12 NOTE — Discharge Instructions (Signed)
Patient may take night time PRN and scheduled medications.
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
## 2014-12-12 NOTE — ED Notes (Signed)
Called placed to facility and spoke to [place].  Called placed to [facility]

## 2014-12-16 ENCOUNTER — Encounter: Payer: Self-pay | Admitting: Podiatry

## 2014-12-16 ENCOUNTER — Ambulatory Visit: Payer: Medicare HMO | Admitting: Physician Assistant

## 2014-12-16 ENCOUNTER — Ambulatory Visit: Payer: Medicare HMO | Admitting: Internal Medicine

## 2014-12-16 ENCOUNTER — Ambulatory Visit (INDEPENDENT_AMBULATORY_CARE_PROVIDER_SITE_OTHER): Payer: Medicare HMO | Admitting: Podiatry

## 2014-12-16 DIAGNOSIS — M79676 Pain in unspecified toe(s): Secondary | ICD-10-CM | POA: Diagnosis not present

## 2014-12-16 DIAGNOSIS — B351 Tinea unguium: Secondary | ICD-10-CM

## 2014-12-16 NOTE — Patient Instructions (Signed)
Slight pinpoint bleeding in the fourth left toe after toenail  Trimming. Remove antibiotic ointment and Band-Aid in 1-3 days and apply topical antibiotic ointment to area until a scab forms  Diabetes and Foot Care Diabetes may cause you to have problems because of poor blood supply (circulation) to your feet and legs. This may cause the skin on your feet to become thinner, break easier, and heal more slowly. Your skin may become dry, and the skin may peel and crack. You may also have nerve damage in your legs and feet causing decreased feeling in them. You may not notice minor injuries to your feet that could lead to infections or more serious problems. Taking care of your feet is one of the most important things you can do for yourself.  HOME CARE INSTRUCTIONS  Wear shoes at all times, even in the house. Do not go barefoot. Bare feet are easily injured.  Check your feet daily for blisters, cuts, and redness. If you cannot see the bottom of your feet, use a mirror or ask someone for help.  Wash your feet with warm water (do not use hot water) and mild soap. Then pat your feet and the areas between your toes until they are completely dry. Do not soak your feet as this can dry your skin.  Apply a moisturizing lotion or petroleum jelly (that does not contain alcohol and is unscented) to the skin on your feet and to dry, brittle toenails. Do not apply lotion between your toes.  Trim your toenails straight across. Do not dig under them or around the cuticle. File the edges of your nails with an emery board or nail file.  Do not cut corns or calluses or try to remove them with medicine.  Wear clean socks or stockings every day. Make sure they are not too tight. Do not wear knee-high stockings since they may decrease blood flow to your legs.  Wear shoes that fit properly and have enough cushioning. To break in new shoes, wear them for just a few hours a day. This prevents you from injuring your  feet. Always look in your shoes before you put them on to be sure there are no objects inside.  Do not cross your legs. This may decrease the blood flow to your feet.  If you find a minor scrape, cut, or break in the skin on your feet, keep it and the skin around it clean and dry. These areas may be cleansed with mild soap and water. Do not cleanse the area with peroxide, alcohol, or iodine.  When you remove an adhesive bandage, be sure not to damage the skin around it.  If you have a wound, look at it several times a day to make sure it is healing.  Do not use heating pads or hot water bottles. They may burn your skin. If you have lost feeling in your feet or legs, you may not know it is happening until it is too late.  Make sure your health care provider performs a complete foot exam at least annually or more often if you have foot problems. Report any cuts, sores, or bruises to your health care provider immediately. SEEK MEDICAL CARE IF:   You have an injury that is not healing.  You have cuts or breaks in the skin.  You have an ingrown nail.  You notice redness on your legs or feet.  You feel burning or tingling in your legs or feet.  You have pain or cramps in your legs and feet.  Your legs or feet are numb.  Your feet always feel cold. SEEK IMMEDIATE MEDICAL CARE IF:   There is increasing redness, swelling, or pain in or around a wound.  There is a red line that goes up your leg.  Pus is coming from a wound.  You develop a fever or as directed by your health care provider.  You notice a bad smell coming from an ulcer or wound.   This information is not intended to replace advice given to you by your health care provider. Make sure you discuss any questions you have with your health care provider.   Document Released: 01/28/2000 Document Revised: 10/02/2012 Document Reviewed: 07/09/2012 Elsevier Interactive Patient Education [employer].

## 2014-12-16 NOTE — Progress Notes (Signed)
Patient ID: Phillip Rocha, male   DOB: 1960/08/31, 54 y.o.   MRN: [id number]   subjective:   this patient presents for scheduled visit complaining of painful toenails walking wearing shoes and requests nail debridement. Patient has had recent back surgery   Objective:  patient transfers from wheelchair to treatment chair   no open skin lesions bilaterally  the toenails are elongated, hypertrophic, deformed , discolored and tender direct palpation 6-10   Assessment:  symptomatic onychomycoses 6-10   type II diabetic   Plan:  debridement toenails 10 mechanically and electrically without any bleeding  Reappoint 3 months

## 2014-12-21 ENCOUNTER — Ambulatory Visit: Payer: Medicare HMO | Admitting: Diagnostic Neuroimaging

## 2014-12-21 ENCOUNTER — Ambulatory Visit: Payer: Medicare HMO | Admitting: Physician Assistant

## 2014-12-29 ENCOUNTER — Encounter: Payer: Self-pay | Admitting: Diagnostic Neuroimaging

## 2014-12-29 ENCOUNTER — Ambulatory Visit (INDEPENDENT_AMBULATORY_CARE_PROVIDER_SITE_OTHER): Payer: Medicare HMO | Admitting: Diagnostic Neuroimaging

## 2014-12-29 VITALS — BP 125/76 | HR 88 | Ht 71.0 in | Wt 341.0 lb

## 2014-12-29 DIAGNOSIS — F0781 Postconcussional syndrome: Secondary | ICD-10-CM

## 2014-12-29 DIAGNOSIS — M542 Cervicalgia: Secondary | ICD-10-CM | POA: Diagnosis not present

## 2014-12-29 DIAGNOSIS — R292 Abnormal reflex: Secondary | ICD-10-CM

## 2014-12-29 DIAGNOSIS — G44301 Post-traumatic headache, unspecified, intractable: Secondary | ICD-10-CM
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
## 2014-12-29 NOTE — Patient Instructions (Signed)
Thank you for coming to see [place] at [facility]. I hope we have been able to provide you high quality care today.  You may receive a patient satisfaction survey over the next few weeks. We would appreciate your feedback and comments so that we may continue to improve ourselves and the health of our patients.  - check MRI brain and cervical spine - consider sleep study - consider topiramate for migraine prevention; hold off for now due to being on multiple other medications   ~~~~~~~~~~~~~~~~~~~~~~~~~~~~~~~~~~~~~~~~~~~~~~~~~~~~~~~~~~~~~~~~~  DR. Luisana Lutzke'S GUIDE TO HAPPY AND HEALTHY LIVING These are some of my general health and wellness recommendations. Some of them may apply to you better than others. Please use common sense as you try these suggestions and feel free to ask me any questions.   ACTIVITY/FITNESS Mental, social, emotional and physical stimulation are very important for brain and body health. Try learning a new activity (arts, music, language, sports, games).  Keep moving your body to the best of your abilities. You can do this at home, inside or outside, the park, community center, gym or anywhere you like. Consider a physical therapist or personal trainer to get started. Consider the app Sworkit. Fitness trackers such as smart-watches, smart-phones or Fitbits can help as well.   NUTRITION Eat more plants: colorful vegetables, nuts, seeds and berries.  Eat less sugar, salt, preservatives and processed foods.  Avoid toxins such as cigarettes and alcohol.  Drink water when you are thirsty. Warm water with a slice of lemon is an excellent morning drink to start the day.  Consider these websites for more information The Nutrition Source (https://www.henry-hernandez.biz/) Precision Nutrition (WindowBlog.ch)   RELAXATION Consider practicing mindfulness meditation or other relaxation techniques such as deep  breathing, prayer, yoga, tai chi, massage. See website mindful.org or the apps Headspace or Calm to help get started.   SLEEP Try to get at least 7-8+ hours sleep per day. Regular exercise and reduced caffeine will help you sleep better. Practice good sleep hygeine techniques. See website sleep.org for more information.   PLANNING Prepare estate planning, living will, healthcare POA documents. Sometimes this is best planned with the help of an attorney. Theconversationproject.org and agingwithdignity.org are excellent resources.

## 2014-12-31 ENCOUNTER — Ambulatory Visit: Payer: Medicare HMO | Admitting: Allergy and Immunology

## 2014-12-31 ENCOUNTER — Telehealth: Payer: Self-pay | Admitting: *Deleted

## 2015-01-01 ENCOUNTER — Telehealth: Payer: Self-pay | Admitting: Diagnostic Neuroimaging

## 2015-01-01 NOTE — Telephone Encounter (Signed)
Patient would like a call back regarding the medication

## 2015-01-01 NOTE — Telephone Encounter (Signed)
Patient called regarding that pharmacy has advised him that there is an error in his prescription that Dr. Leta Baptist prescribed on 12/29/14 at visit, Rx is written backwards. Patient doesn't know which medication it is except that it is for migraines.

## 2015-01-01 NOTE — Telephone Encounter (Signed)
It doesn't appear we prescribed anything for this patient in 11/15.  I called the pharmacy to clarify.  Spoke with Summer.  She thinks he is calling the wrong office.  Says there is an issue with one of his Rx's but it's from Dr Rico Junker.  I called back and spoke with the patient.  Relayed this info.  He expressed understanding and will call that office instead.

## 2015-01-11 ENCOUNTER — Telehealth: Payer: Self-pay | Admitting: Diagnostic Neuroimaging

## 2015-01-11 NOTE — Telephone Encounter (Signed)
I spoke with patient this morning and advised patient to call [facility] [phone]. I explained to the patient that due to his insurance being medicare I sent both orders to [facility] Has tried to call to schedule his appointment. I advised the patient that if he needed any further assistance from me to call me [phone]. Thanks!

## 2015-01-11 NOTE — Telephone Encounter (Signed)
Patient called back stating that he does not feel comfortable doing the MRI with all of the metal he has in his body from his previous surgeries. Patient would like to know if there is anything else that can be done. Please call and advise patient. Thanks!

## 2015-01-11 NOTE — Telephone Encounter (Signed)
I recommend MRI. Otherwise, continue current plan from note. -VRP

## 2015-01-11 NOTE — Telephone Encounter (Signed)
Patient is calling and states he cannot have the MRI for his spine and head as he recently had back surgery and  has a stainless steel rod in his back.  Please call.

## 2015-01-13 NOTE — Telephone Encounter (Signed)
LVM requesting call back to inform patient of Dr [facility] recommendations. Left this caller's name, number.

## 2015-01-13 NOTE — Telephone Encounter (Signed)
Spoke with patient who continues to feel uneasy about MRI. Informed him Dr Leta Baptist would then recommend he have a sleep study. Patient stated "I've been asked to do that a lot of times. What does that have to do with migraines?" Attempted to explain reason for sleep study, however patient interrupted several times. He requested phone number to [facility] to speak with them regarding MRI and metal in his body. He stated he would call back. Gave him number.

## 2015-01-13 NOTE — Telephone Encounter (Signed)
Spoke with patient and informed him he may pick up premed here or this RN will call to his pharmacy. He requested it be called to [facility]. Informed him this RN will call now. He stated he will call [facility] to schedule MRI. He verbalized understanding, appreciation for call. Script called in for Xanax 0.5 mg tabs, disp  #3, take 1-2 tabs 30 min prior to scan. May take additional tab just before scan if needed, 0 refills.

## 2015-01-13 NOTE — Telephone Encounter (Signed)
Patient called to advise he doesn't agree with MRI because of the metal in his body, stainless steel in back, screws, cage built in spine etc., was advised by [facility] to call Dr. Leta Baptist to see what other test he wants to do. Patient states migraines are getting worse.

## 2015-01-13 NOTE — Telephone Encounter (Signed)
Pt called sts if he could have medication for claustrophobia he would have MRI

## 2015-01-13 NOTE — Telephone Encounter (Signed)
Pickup xanax pack. Or send to pharmacy. -VRP

## 2015-01-22 ENCOUNTER — Ambulatory Visit
Admission: RE | Admit: 2015-01-22 | Discharge: 2015-01-22 | Disposition: A | Discharge status: Home / Self Care | Payer: Medicare HMO | Source: Ambulatory Visit | Attending: Diagnostic Neuroimaging | Admitting: Diagnostic Neuroimaging

## 2015-01-22 DIAGNOSIS — M542 Cervicalgia: Secondary | ICD-10-CM

## 2015-01-22 DIAGNOSIS — F0781 Postconcussional syndrome: Secondary | ICD-10-CM

## 2015-01-22 DIAGNOSIS — G44301 Post-traumatic headache, unspecified, intractable: Secondary | ICD-10-CM

## 2015-01-22 DIAGNOSIS — R292 Abnormal reflex: Secondary | ICD-10-CM

## 2015-02-02 ENCOUNTER — Telehealth: Payer: Self-pay | Admitting: Diagnostic Neuroimaging

## 2015-02-02 NOTE — Telephone Encounter (Signed)
Patient is calling to get the results of his MRI.  He states he is still having the headaches and pain in shoulder and neck. Please call.

## 2015-02-02 NOTE — Telephone Encounter (Signed)
Mild scar tissue in brain, but nothing severe. Mild arthritis in neck, but nothing severe. -VRP

## 2015-02-02 NOTE — Telephone Encounter (Signed)
Spoke with patient and informed him of Dr [employer] detailed message. He requests his FU be moved up; scheduled for 02/23/14, advised to arrive 15 min early. He verbalized understanding, appreciation.

## 2015-02-03 NOTE — Telephone Encounter (Signed)
error

## 2015-02-14 HISTORY — PX: DENTAL SURGERY: SHX609

## 2015-02-22 ENCOUNTER — Ambulatory Visit: Payer: Medicare HMO | Admitting: Diagnostic Neuroimaging

## 2015-02-24 ENCOUNTER — Ambulatory Visit: Payer: Medicare HMO | Admitting: Diagnostic Neuroimaging

## 2015-02-26 ENCOUNTER — Ambulatory Visit (INDEPENDENT_AMBULATORY_CARE_PROVIDER_SITE_OTHER): Payer: Medicare Other | Admitting: Diagnostic Neuroimaging

## 2015-02-26 ENCOUNTER — Encounter: Payer: Self-pay | Admitting: Diagnostic Neuroimaging

## 2015-02-26 VITALS — BP 125/76 | HR 92 | Ht 71.0 in | Wt 356.0 lb

## 2015-02-26 DIAGNOSIS — G44301 Post-traumatic headache, unspecified, intractable: Secondary | ICD-10-CM

## 2015-02-26 MED ORDER — TOPIRAMATE 50 MG PO TABS: 50.0000 mg | ORAL_TABLET | Freq: Two times a day (BID) | ORAL | Status: AC
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
## 2015-02-26 NOTE — Patient Instructions (Signed)
Thank you for coming to see [place] at [facility]. I hope we have been able to provide you high quality care today.  You may receive a patient satisfaction survey over the next few weeks. We would appreciate your feedback and comments so that we may continue to improve ourselves and the health of our patients.  - try topiramate 23m at bedtime; after 1 week increase to twice a day - drink plenty of water - consider treatment of anxiety and insomnia with psychiatry/psychology   ~~~~~~~~~~~~~~~~~~~~~~~~~~~~~~~~~~~~~~~~~~~~~~~~~~~~~~~~~~~~~~~~~  DR. PENUMALLI'S GUIDE TO HAPPY AND HEALTHY LIVING These are some of my general health and wellness recommendations. Some of them may apply to you better than others. Please use common sense as you try these suggestions and feel free to ask me any questions.   ACTIVITY/FITNESS Mental, social, emotional and physical stimulation are very important for brain and body health. Try learning a new activity (arts, music, language, sports, games).  Keep moving your body to the best of your abilities. You can do this at home, inside or outside, the park, community center, gym or anywhere you like. Consider a physical therapist or personal trainer to get started. Consider the app Sworkit. Fitness trackers such as smart-watches, smart-phones or Fitbits can help as well.   NUTRITION Eat more plants: colorful vegetables, nuts, seeds and berries.  Eat less sugar, salt, preservatives and processed foods.  Avoid toxins such as cigarettes and alcohol.  Drink water when you are thirsty. Warm water with a slice of lemon is an excellent morning drink to start the day.  Consider these websites for more information The Nutrition Source (hhttps://www.henry-hernandez.biz/ [employer] Nutrition (wWindowBlog.ch   RELAXATION Consider practicing mindfulness meditation or other relaxation techniques such as deep  breathing, prayer, yoga, tai chi, massage. See website mindful.org or the apps Headspace or Calm to help get started.   SLEEP Try to get at least 7-8+ hours sleep per day. Regular exercise and reduced caffeine will help you sleep better. Practice good sleep hygeine techniques. See website sleep.org for more information.   PLANNING Prepare estate planning, living will, healthcare POA documents. Sometimes this is best planned with the help of an attorney. Theconversationproject.org and agingwithdignity.org are excellent resources.

## 2015-03-01 ENCOUNTER — Other Ambulatory Visit: Payer: Self-pay | Admitting: Specialist

## 2015-03-01 DIAGNOSIS — R29898 Other symptoms and signs involving the musculoskeletal system: Secondary | ICD-10-CM

## 2015-03-01 DIAGNOSIS — M79604 Pain in right leg: Secondary | ICD-10-CM

## 2015-03-02 ENCOUNTER — Other Ambulatory Visit: Payer: Self-pay

## 2015-03-05 ENCOUNTER — Ambulatory Visit
Admission: RE | Admit: 2015-03-05 | Discharge: 2015-03-05 | Disposition: A | Discharge status: Home / Self Care | Payer: Medicare Other | Source: Ambulatory Visit | Attending: Specialist | Admitting: Specialist

## 2015-03-05 DIAGNOSIS — M79604 Pain in right leg: Secondary | ICD-10-CM

## 2015-03-05 DIAGNOSIS — R29898 Other symptoms and signs involving the musculoskeletal system: Secondary | ICD-10-CM

## 2015-03-10 ENCOUNTER — Ambulatory Visit: Payer: Medicare HMO | Admitting: Diagnostic Neuroimaging

## 2015-03-31 ENCOUNTER — Ambulatory Visit: Payer: Medicare HMO | Admitting: Diagnostic Neuroimaging

## 2015-03-31 ENCOUNTER — Ambulatory Visit: Payer: Medicare HMO | Admitting: Podiatry

## 2015-04-02 ENCOUNTER — Ambulatory Visit: Payer: Self-pay | Admitting: Allergy and Immunology

## 2015-04-09 ENCOUNTER — Ambulatory Visit: Payer: Self-pay | Admitting: Allergy and Immunology

## 2015-04-14 ENCOUNTER — Ambulatory Visit: Payer: Medicare Other | Admitting: Podiatry

## 2015-04-27 ENCOUNTER — Ambulatory Visit (INDEPENDENT_AMBULATORY_CARE_PROVIDER_SITE_OTHER): Payer: Medicare Other | Admitting: Podiatry

## 2015-04-27 ENCOUNTER — Encounter: Payer: Self-pay | Admitting: Podiatry

## 2015-04-27 DIAGNOSIS — B351 Tinea unguium: Secondary | ICD-10-CM

## 2015-04-27 DIAGNOSIS — M79676 Pain in unspecified toe(s): Secondary | ICD-10-CM | POA: Diagnosis not present

## 2015-04-27 NOTE — Patient Instructions (Signed)
There was slight bleeding at the end of the fourth left toe today after trimming her toenails. An antibiotic Band-Aid was applied to the area. Removed Band-Aid and 1-3 days and apply topical antibiotic ointment daily until a scab forms  Diabetes and Foot Care Diabetes may cause you to have problems because of poor blood supply (circulation) to your feet and legs. This may cause the skin on your feet to become thinner, break easier, and heal more slowly. Your skin may become dry, and the skin may peel and crack. You may also have nerve damage in your legs and feet causing decreased feeling in them. You may not notice minor injuries to your feet that could lead to infections or more serious problems. Taking care of your feet is one of the most important things you can do for yourself.  HOME CARE INSTRUCTIONS  Wear shoes at all times, even in the house. Do not go barefoot. Bare feet are easily injured.  Check your feet daily for blisters, cuts, and redness. If you cannot see the bottom of your feet, use a mirror or ask someone for help.  Wash your feet with warm water (do not use hot water) and mild soap. Then pat your feet and the areas between your toes until they are completely dry. Do not soak your feet as this can dry your skin.  Apply a moisturizing lotion or petroleum jelly (that does not contain alcohol and is unscented) to the skin on your feet and to dry, brittle toenails. Do not apply lotion between your toes.  Trim your toenails straight across. Do not dig under them or around the cuticle. File the edges of your nails with an emery board or nail file.  Do not cut corns or calluses or try to remove them with medicine.  Wear clean socks or stockings every day. Make sure they are not too tight. Do not wear knee-high stockings since they may decrease blood flow to your legs.  Wear shoes that fit properly and have enough cushioning. To break in new shoes, wear them for just a few hours a  day. This prevents you from injuring your feet. Always look in your shoes before you put them on to be sure there are no objects inside.  Do not cross your legs. This may decrease the blood flow to your feet.  If you find a minor scrape, cut, or break in the skin on your feet, keep it and the skin around it clean and dry. These areas may be cleansed with mild soap and water. Do not cleanse the area with peroxide, alcohol, or iodine.  When you remove an adhesive bandage, be sure not to damage the skin around it.  If you have a wound, look at it several times a day to make sure it is healing.  Do not use heating pads or hot water bottles. They may burn your skin. If you have lost feeling in your feet or legs, you may not know it is happening until it is too late.  Make sure your health care provider performs a complete foot exam at least annually or more often if you have foot problems. Report any cuts, sores, or bruises to your health care provider immediately. SEEK MEDICAL CARE IF:   You have an injury that is not healing.  You have cuts or breaks in the skin.  You have an ingrown nail.  You notice redness on your legs or feet.  You feel burning or  tingling in your legs or feet.  You have pain or cramps in your legs and feet.  Your legs or feet are numb.  Your feet always feel cold. SEEK IMMEDIATE MEDICAL CARE IF:   There is increasing redness, swelling, or pain in or around a wound.  There is a red line that goes up your leg.  Pus is coming from a wound.  You develop a fever or as directed by your health care provider.  You notice a bad smell coming from an ulcer or wound.   This information is not intended to replace advice given to you by your health care provider. Make sure you discuss any questions you have with your health care provider.   Document Released: 01/28/2000 Document Revised: 10/02/2012 Document Reviewed: 07/09/2012 Elsevier Interactive Patient Education  [employer].

## 2015-04-27 NOTE — Progress Notes (Signed)
Patient ID: Phillip Rocha, male   DOB: [birth year], 55 y.o.   MRN: [id number]  Subjective: This patient presents again for scheduled plain that his toenails are uncomfortable when wearing shoes and when he transfers from wheelchair and is requesting toenail debridement Patient is diabetic and recovering from recent back surgery  Objective: Patient is transfers from wheelchair to treatment chair Orientated 3 No skin lesions bilaterally The toenails are hypertrophic, elongated, deformed, discolored and tender direct palpation 6-10  Assessment: Symptomatic onychomycoses 6-10 Type II diabetic  Plan: Debridement toenails 6-10 mechanically electrically. Slight bleeding distal fourth left toe treated with antibiotic dressing. Patient advised to remove this dressing 1-3 days and to continue apply topical antibiotic ointment and Band-Aid until a scab forms

## 2015-05-05 ENCOUNTER — Encounter: Payer: Self-pay | Admitting: Allergy and Immunology

## 2015-05-05 ENCOUNTER — Ambulatory Visit (INDEPENDENT_AMBULATORY_CARE_PROVIDER_SITE_OTHER): Payer: Medicare Other | Admitting: Allergy and Immunology

## 2015-05-05 ENCOUNTER — Telehealth: Payer: Self-pay

## 2015-05-05 VITALS — BP 120/82 | HR 100 | Temp 97.5°F | Resp 18

## 2015-05-05 DIAGNOSIS — R05 Cough: Secondary | ICD-10-CM | POA: Diagnosis not present

## 2015-05-05 DIAGNOSIS — J019 Acute sinusitis, unspecified: Secondary | ICD-10-CM | POA: Insufficient documentation

## 2015-05-05 DIAGNOSIS — J3089 Other allergic rhinitis: Secondary | ICD-10-CM

## 2015-05-05 DIAGNOSIS — J0110 Acute frontal sinusitis, unspecified: Secondary | ICD-10-CM | POA: Diagnosis not present

## 2015-05-05 DIAGNOSIS — R059 Cough, unspecified: Secondary | ICD-10-CM | POA: Insufficient documentation

## 2015-05-05 DIAGNOSIS — E1100 Type 2 diabetes mellitus with hyperosmolarity without nonketotic hyperglycemic-hyperosmolar coma (NKHHC): Secondary | ICD-10-CM

## 2015-05-05 MED ORDER — PREDNISONE 1 MG PO TABS: 10.0000 mg | ORAL_TABLET | ORAL | Status: DC

## 2015-05-05 MED ORDER — AZELASTINE-FLUTICASONE 137-50 MCG/ACT NA SUSP: 1.0000 | Freq: Two times a day (BID) | NASAL | Status: DC

## 2015-05-05 NOTE — Assessment & Plan Note (Signed)
The patient has been asked to carefully monitor blood glucose levels while on prednisone.  He has verbalized understanding and has agreed to do so.

## 2015-05-05 NOTE — Assessment & Plan Note (Addendum)
Continue allergen avoidance measures.  Restart Dymista nasal spray.  Restart aeroallergen immunotherapy.  The vials will be remixed and the dose will be adjusted appropriately.

## 2015-05-05 NOTE — Assessment & Plan Note (Signed)
Prednisone has been provided, 40 mg x3 days, 20 mg x1 day, 10 mg x1 day, then stop.  Restart Dymista nasal spray.  A prescription and sample have been provided.  Nasal saline lavage (NeilMed) as needed has been recommended along with instructions for proper administration.  Guaifenesin 1200 mg twice daily as needed with adequate hydration as discussed.   The patient has been asked to contact me if his symptoms persist, progress, or if he becomes febrile. Otherwise, he may return for follow up in 6 months.

## 2015-05-05 NOTE — Assessment & Plan Note (Signed)
The patient's history and physical examination suggest upper airway cough syndrome.  Spirometry today reveals normal ventilatory function. We will aggressively treat postnasal drainage and evaluate results.  Treatment plan as outlined above.  If the coughing persists or progresses despite this plan, we will evaluate further.

## 2015-05-05 NOTE — Telephone Encounter (Signed)
Per Dr. Verlin Fester, call patient to advise we would need to order new vials for him to restart injections.  Get a verbal authorization and a have a second nurse get verbal authorization since patient is not in office to sign form.  Per Dr. Verlin Fester we would restart patient at [facility] since last injection was 10-30-14.

## 2015-05-05 NOTE — Patient Instructions (Addendum)
Acute sinusitis  Prednisone has been provided, 40 mg x3 days, 20 mg x1 day, 10 mg x1 day, then stop.  Restart Dymista nasal spray.  A prescription and sample have been provided.  Nasal saline lavage (NeilMed) as needed has been recommended along with instructions for proper administration.  Guaifenesin 1200 mg twice daily as needed with adequate hydration as discussed.   The patient has been asked to contact me if his symptoms persist, progress, or if he becomes febrile. Otherwise, he may return for follow up in 6 months.  Coughing The patient's history and physical examination suggest upper airway cough syndrome.  Spirometry today reveals normal ventilatory function. We will aggressively treat postnasal drainage and evaluate results.  Treatment plan as outlined above.  If the coughing persists or progresses despite this plan, we will evaluate further.  Diabetes mellitus, type 2 ([place])  The patient has been asked to carefully monitor blood glucose levels while on prednisone.  He has verbalized understanding and has agreed to do so.  Allergic rhinitis  Continue allergen avoidance measures.  Restart Dymista nasal spray.  Restart aeroallergen immunotherapy.  The vials will be remixed and the dose will be adjusted appropriately.    Return in about 6 months (around 11/05/2015), or if symptoms worsen or fail to improve.

## 2015-05-05 NOTE — Progress Notes (Signed)
Follow-up Note  RE: Phillip Rocha MRN: [id number] DOB: 1960/04/22 Date of Office Visit: 05/05/2015  Primary care provider: Velna Hatchet, MD Referring provider: Velna Hatchet, MD  History of present illness: HPI Comments: Phillip Rocha is a 55 y.o. male with allergic rhinitis and history of rhinitis medicamentosa who presents today for sick visit.  He reports that over the past 3 or 4 days he has experienced severe nasal congestion resulting and mouth breathing, sinus pressure, thick postnasal drainage, and coughing.  He denies fevers, chills, or purulent mucus production.  He has required Afrin nasal spray over the past 2 days.  He had started aeroallergen immunotherapy in September 2015 and had experienced significant symptom relief.  His September 2016 he discontinued immunotherapy because his symptoms were so well controlled that he thought he was "cured."  He is interested in restarting immunotherapy.   Assessment and plan: Acute sinusitis  Prednisone has been provided, 40 mg x3 days, 20 mg x1 day, 10 mg x1 day, then stop.  Restart Dymista nasal spray.  A prescription and sample have been provided.  Nasal saline lavage ([facility]) as needed has been recommended along with instructions for proper administration.  Guaifenesin 1200 mg twice daily as needed with adequate hydration as discussed.   The patient has been asked to contact me if his symptoms persist, progress, or if he becomes febrile. Otherwise, he may return for follow up in 6 months.  Coughing The patient's history and physical examination suggest upper airway cough syndrome.  Spirometry today reveals normal ventilatory function. We will aggressively treat postnasal drainage and evaluate results.  Treatment plan as outlined above.  If the coughing persists or progresses despite this plan, we will evaluate further.  Diabetes mellitus, type 2 ([place])  The patient has been asked to carefully monitor blood  glucose levels while on prednisone.  He has verbalized understanding and has agreed to do so.  Allergic rhinitis  Continue allergen avoidance measures.  Restart Dymista nasal spray.  Restart aeroallergen immunotherapy.  The vials will be remixed and the dose will be adjusted appropriately.    Meds ordered this encounter  Medications  . Azelastine-Fluticasone 137-50 MCG/ACT SUSP    Sig: Place 1 spray into the nose 2 (two) times daily.    Dispense:  23 g    Refill:  5  . predniSONE (DELTASONE) tablet 10 mg    Sig:     Diagnositics: Spirometry reveals an FVC of 3.02 L and an FEV1 of 2.73 L (84% predicted) without postbronchodilator improvement.  Mild restrictive pattern is most likely due to body habitus.    Physical examination: Blood pressure 120/82, pulse 100, temperature 97.5 F (36.4 C), temperature source Oral, resp. rate 18.  General: Alert, interactive, in no acute distress. HEENT: TMs pearly gray, turbinates markedly edematous with thick discharge, post-pharynx erythematous. Neck: Supple without lymphadenopathy. Lungs: Clear to auscultation without wheezing, rhonchi or rales. CV: Normal S1, S2 without murmurs. Skin: Warm and dry, without lesions or rashes.  The following portions of the patient's history were reviewed and updated as appropriate: allergies, current medications, past family history, past medical history, past social history, past surgical history and problem list.    Medication List       This list is accurate as of: 05/05/15 12:41 PM.  Always use your most recent med list.               allopurinol 100 MG tablet  Commonly known as:  ZYLOPRIM  Take  100 mg by mouth 2 (two) times daily.     AMITIZA 24 MCG capsule  Generic drug:  lubiprostone  Take 24 mcg by mouth as needed.     amLODipine 10 MG tablet  Commonly known as:  NORVASC  Take 10 mg by mouth every morning.     calcium-vitamin D 500-200 MG-UNIT tablet  Take by mouth.      clobetasol ointment 0.05 %  Commonly known as:  TEMOVATE  Apply 1 application topically daily as needed (for psoriasis= apply to whole body= mix with aquaphor ointment).     colchicine 0.6 MG tablet     COSENTYX SENSOREADY PEN 150 MG/ML Soaj  Generic drug:  Secukinumab  Inject 300 mg into the skin every 30 (thirty) days.     DEXILANT 60 MG capsule  Generic drug:  dexlansoprazole  Take 60 mg by mouth daily.     DYMISTA 137-50 MCG/ACT Susp  Generic drug:  Azelastine-Fluticasone  Place 1 spray into the nose daily.     Azelastine-Fluticasone 137-50 MCG/ACT Susp  Place 1 spray into the nose 2 (two) times daily.     furosemide 40 MG tablet  Commonly known as:  LASIX  Take 40 mg by mouth daily.     gabapentin 300 MG capsule  Commonly known as:  NEURONTIN  Take 300 mg by mouth 4 (four) times daily.     hydrOXYzine 50 MG tablet  Commonly known as:  ATARAX/VISTARIL  Take 2 tablets (100 mg total) by mouth 3 (three) times daily as needed (itching).     hydrOXYzine 50 MG tablet  Commonly known as:  ATARAX/VISTARIL  Take 2 tablets (100 mg total) by mouth 3 (three) times daily as needed for anxiety or itching (itching).     levofloxacin 750 MG tablet  Commonly known as:  LEVAQUIN  Take 1 tablet (750 mg total) by mouth daily.     losartan 50 MG tablet  Commonly known as:  COZAAR  Take 50 mg by mouth every morning.     metFORMIN 1000 MG tablet  Commonly known as:  GLUCOPHAGE  Take 1,000 mg by mouth 2 (two) times daily with a meal.     mineral oil-hydrophilic petrolatum ointment  Apply 1 application topically daily as needed for dry skin (for psoriasis= apply to whole body= mix with clobetasol 0.05% ointment).     montelukast 10 MG tablet  Commonly known as:  SINGULAIR  Take 1 tablet (10 mg total) by mouth at bedtime.     morphine 15 MG 12 hr tablet  Commonly known as:  MS CONTIN  Take 15 mg by mouth as needed.     mupirocin cream 2 %  Commonly known as:  BACTROBAN  Apply  1 application topically as needed.     omeprazole 40 MG capsule  Commonly known as:  PRILOSEC  Take 40 mg by mouth daily.     oxycodone 30 MG immediate release tablet  Commonly known as:  ROXICODONE  Take 1 tablet (30 mg total) by mouth every 4 (four) hours as needed for pain.     QNASL 80 MCG/ACT Aers  Generic drug:  Beclomethasone Dipropionate  Place 1 spray into the nose 2 (two) times daily.     ranitidine 150 MG capsule  Commonly known as:  ZANTAC  Take 1 capsule (150 mg total) by mouth 2 (two) times daily.     sertraline 25 MG tablet  Commonly known as:  ZOLOFT  Take 25 mg by  mouth 2 (two) times daily.     temazepam 30 MG capsule  Commonly known as:  RESTORIL  Take 1 capsule (30 mg total) by mouth at bedtime as needed for sleep.     tiZANidine 2 MG tablet  Commonly known as:  ZANAFLEX  Take 1 tablet (2 mg total) by mouth 3 (three) times daily.     topiramate 50 MG tablet  Commonly known as:  TOPAMAX  Take 1 tablet (50 mg total) by mouth 2 (two) times daily.     triamcinolone ointment 0.1 %  Commonly known as:  KENALOG  Apply 1 application topically as needed.     vitamin B-12 1000 MCG tablet  Commonly known as:  CYANOCOBALAMIN  Take 1,000 mcg by mouth daily.     vitamin C 500 MG tablet  Commonly known as:  ASCORBIC ACID  Take 500 mg by mouth daily.     zolpidem 10 MG tablet  Commonly known as:  AMBIEN  Take 10 mg by mouth at bedtime as needed for sleep.        No Known Allergies  Review of systems: Constitutional: Negative for fever, chills and weight loss.  HENT: Negative for nosebleeds.   Negative for pearly and mucus production.  Positive for nasal congestion, postnasal drainage, sinus pressure. Eyes: Negative for blurred vision.  Respiratory: Negative for hemoptysis.   Positive for coughing. Cardiovascular: Negative for chest pain.  Gastrointestinal: Negative for diarrhea and constipation.  Genitourinary: Negative for dysuria.  Musculoskeletal:  Negative for myalgias and joint pain.  Neurological: Negative for dizziness.  Endo/Heme/Allergies: Does not bruise/bleed easily.   Past Medical History  Diagnosis Date  . Hypertension   . Anxiety   . GERD (gastroesophageal reflux disease)   . Arthritis   . Psoriatic arthritis ([place])     takes Humira every 2 weeks  . Diabetes mellitus without complication ([facility])     NIDDM x 2 years  . Degenerative arthritis     knees and spine  . Psoriasis   . Morbid obesity ([place])   . Claustrophobia   . Anemia   . Lymphadenopathy 08/25/2014  . Shortness of breath dyspnea   . Cough with sputum     yellow color  . Headache     Family History  Problem Relation Age of Onset  . Hypertension Mother   . Cancer Neg Hx     Social History   Social History  . Marital Status: Single    Spouse Name: N/A  . Number of Children: 2  . Years of Education: 12   Occupational History  . unemployed    Social History Main Topics  . Smoking status: Never Smoker   . Smokeless tobacco: Never Used     Comment:    . Alcohol Use: No     Comment: no alcohol x 3 months  . Drug Use: No  . Sexual Activity: Not Currently   Other Topics Concern  . Not on file   Social History Narrative   Lives at home with son.   Caffeine use: rare    I appreciate the opportunity to take part in this Mohamad's care. Please do not hesitate to contact me with questions.  Sincerely,   R. Edgar Frisk, MD

## 2015-05-07 NOTE — Telephone Encounter (Signed)
Authorization given by patient to order new vials.  Verified by Nancee Liter, CMA and myself.  Appointment scheduled in 2 weeks for restart.

## 2015-05-13 DIAGNOSIS — J301 Allergic rhinitis due to pollen: Secondary | ICD-10-CM | POA: Diagnosis not present

## 2015-05-14 DIAGNOSIS — J3089 Other allergic rhinitis: Secondary | ICD-10-CM | POA: Diagnosis not present

## 2015-05-18 ENCOUNTER — Telehealth: Payer: Self-pay

## 2015-05-18 ENCOUNTER — Other Ambulatory Visit: Payer: Self-pay

## 2015-05-18 MED ORDER — FLUTICASONE PROPIONATE 50 MCG/ACT NA SUSP: NASAL | Status: DC

## 2015-05-18 MED ORDER — AZELASTINE HCL 0.15 % NA SOLN: NASAL | Status: DC

## 2015-05-18 NOTE — Telephone Encounter (Signed)
Patient informed. [place] sent.

## 2015-05-18 NOTE — Telephone Encounter (Signed)
Please advise.

## 2015-05-18 NOTE — Telephone Encounter (Signed)
Patient went to pick up Dymista from the pharmacy and it cost him $156.00. Pt is wondering can he have samples or can it be switched to something not so expensive.  Please Advise  Thanks   [place]

## 2015-05-18 NOTE — Telephone Encounter (Signed)
Please rx generic azelastine, 2 sprays per nostril twice a day, and fluticasone nasal spray, one spray per nostril twice a day.  Please inform the patient that these are the 2 constituents of Dymista and let him know when these have been called in.  Thank you.

## 2015-05-21 ENCOUNTER — Encounter (HOSPITAL_COMMUNITY): Payer: Self-pay | Admitting: *Deleted

## 2015-05-21 MED ORDER — DEXTROSE 5 % IV SOLN
3.0000 g | INTRAVENOUS | Status: AC
  Administered 2015-05-24: 3 g via INTRAVENOUS
  Filled 2015-05-21: qty 3000

## 2015-05-21 NOTE — Progress Notes (Signed)
05/21/15 1920  OBSTRUCTIVE SLEEP APNEA  Have you ever been diagnosed with sleep apnea through a sleep study? No  Do you snore loudly (loud enough to be heard through closed doors)?  0  Do you often feel tired, fatigued, or sleepy during the daytime (such as falling asleep during driving or talking to someone)? 0  Has anyone observed you stop breathing during your sleep? 0  Do you have, or are you being treated for high blood pressure? 1  BMI more than 35 kg/m2? 1  Age > 50 (1-yes) 1  Neck circumference greater than:Male 16 inches or larger, Male 17inches or larger? 1  Male Gender (Yes=1) 1  Obstructive Sleep Apnea Score 5

## 2015-05-21 NOTE — Progress Notes (Signed)
Pt denies any acute cardiopulmonary issues. Pt denies having a cardiac cath but stated that a styress test was performed at [facility] in [place]; need sign consent for release of info. Pt stated that he did not want to take any medications until after procedure; pt advised to take BP medication, Prilosec and PRN's if needed. Pt made aware to not take diabetes pill DOS ( Metformin). Pt stated that he does not chesk his BS when attempting to provide Diabetes education. Pt made aware to stop taking Aspirin, vitamins, fish oil and herbal medications. Do not take any NSAIDs ie: Ibuprofen, Advil, Naproxen, BC and Goody Powder or any medication containing Aspirin. Pt verbalized understanding of all pre-op instructions.

## 2015-05-22 ENCOUNTER — Emergency Department (HOSPITAL_COMMUNITY)
Admission: EM | Admit: 2015-05-22 | Discharge: 2015-05-22 | Disposition: A | Discharge status: Home / Self Care | Payer: Medicare Other | Attending: Emergency Medicine | Admitting: Emergency Medicine

## 2015-05-22 ENCOUNTER — Encounter (HOSPITAL_COMMUNITY): Payer: Self-pay | Admitting: Emergency Medicine

## 2015-05-22 ENCOUNTER — Emergency Department (HOSPITAL_COMMUNITY): Payer: Medicare Other

## 2015-05-22 DIAGNOSIS — M1990 Unspecified osteoarthritis, unspecified site: Secondary | ICD-10-CM | POA: Insufficient documentation

## 2015-05-22 DIAGNOSIS — F17200 Nicotine dependence, unspecified, uncomplicated: Secondary | ICD-10-CM | POA: Insufficient documentation

## 2015-05-22 DIAGNOSIS — K219 Gastro-esophageal reflux disease without esophagitis: Secondary | ICD-10-CM | POA: Insufficient documentation

## 2015-05-22 DIAGNOSIS — Y9389 Activity, other specified: Secondary | ICD-10-CM | POA: Insufficient documentation

## 2015-05-22 DIAGNOSIS — I10 Essential (primary) hypertension: Secondary | ICD-10-CM | POA: Diagnosis not present

## 2015-05-22 DIAGNOSIS — Z7952 Long term (current) use of systemic steroids: Secondary | ICD-10-CM | POA: Diagnosis not present

## 2015-05-22 DIAGNOSIS — Z872 Personal history of diseases of the skin and subcutaneous tissue: Secondary | ICD-10-CM | POA: Insufficient documentation

## 2015-05-22 DIAGNOSIS — Z7984 Long term (current) use of oral hypoglycemic drugs: Secondary | ICD-10-CM | POA: Insufficient documentation

## 2015-05-22 DIAGNOSIS — Z8669 Personal history of other diseases of the nervous system and sense organs: Secondary | ICD-10-CM | POA: Diagnosis not present

## 2015-05-22 DIAGNOSIS — Z79899 Other long term (current) drug therapy: Secondary | ICD-10-CM | POA: Insufficient documentation

## 2015-05-22 DIAGNOSIS — S4992XA Unspecified injury of left shoulder and upper arm, initial encounter: Secondary | ICD-10-CM | POA: Diagnosis present

## 2015-05-22 DIAGNOSIS — M170 Bilateral primary osteoarthritis of knee: Secondary | ICD-10-CM | POA: Diagnosis not present

## 2015-05-22 DIAGNOSIS — D649 Anemia, unspecified: Secondary | ICD-10-CM | POA: Diagnosis not present

## 2015-05-22 DIAGNOSIS — T148XXA Other injury of unspecified body region, initial encounter: Secondary | ICD-10-CM

## 2015-05-22 DIAGNOSIS — E119 Type 2 diabetes mellitus without complications: Secondary | ICD-10-CM | POA: Diagnosis not present

## 2015-05-22 DIAGNOSIS — Y998 Other external cause status: Secondary | ICD-10-CM | POA: Insufficient documentation

## 2015-05-22 DIAGNOSIS — Y9289 Other specified places as the place of occurrence of the external cause: Secondary | ICD-10-CM | POA: Diagnosis not present

## 2015-05-22 DIAGNOSIS — S50811A Abrasion of right forearm, initial encounter: Secondary | ICD-10-CM | POA: Insufficient documentation

## 2015-05-22 DIAGNOSIS — S50812A Abrasion of left forearm, initial encounter: Secondary | ICD-10-CM | POA: Diagnosis not present

## 2015-05-22 MED ORDER — BACITRACIN ZINC 500 UNIT/GM EX OINT
TOPICAL_OINTMENT | Freq: Two times a day (BID) | CUTANEOUS | Status: DC
  Administered 2015-05-22: 1 via TOPICAL
  Filled 2015-05-22: qty 1.8

## 2015-05-22 MED ORDER — OXYCODONE HCL 5 MG PO TABS
20.0000 mg | ORAL_TABLET | ORAL | Status: AC
  Administered 2015-05-22: 20 mg via ORAL
  Filled 2015-05-22: qty 4
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
## 2015-05-22 NOTE — ED Notes (Signed)
Pt has left his ID cards and credit cards in the emergency room.  This RN called and left a voice message on the mobile number on file notifying him that his personal effects have been locked away in the Security office and that he will have to come back here to claim them.  His belongings have been secured in a patient belongings envelope and placed in a locked safe by security with RN present as witness.

## 2015-05-22 NOTE — ED Notes (Signed)
Called [facility] for transport home.
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
## 2015-05-24 ENCOUNTER — Ambulatory Visit (HOSPITAL_COMMUNITY)
Admission: RE | Admit: 2015-05-24 | Discharge: 2015-05-24 | Disposition: A | Discharge status: Home / Self Care | Payer: Medicare Other | Source: Ambulatory Visit | Attending: Specialist | Admitting: Specialist

## 2015-05-24 ENCOUNTER — Ambulatory Visit (HOSPITAL_COMMUNITY): Payer: Medicare Other | Admitting: Anesthesiology

## 2015-05-24 ENCOUNTER — Encounter (HOSPITAL_COMMUNITY)
Admission: RE | Disposition: A | Discharge status: Home / Self Care | Payer: Self-pay | Source: Ambulatory Visit | Attending: Specialist

## 2015-05-24 ENCOUNTER — Ambulatory Visit (HOSPITAL_COMMUNITY): Payer: Medicare Other

## 2015-05-24 ENCOUNTER — Encounter (HOSPITAL_COMMUNITY): Payer: Self-pay | Admitting: *Deleted

## 2015-05-24 ENCOUNTER — Telehealth: Payer: Self-pay | Admitting: *Deleted

## 2015-05-24 DIAGNOSIS — Z6841 Body Mass Index (BMI) 40.0 and over, adult: Secondary | ICD-10-CM | POA: Diagnosis not present

## 2015-05-24 DIAGNOSIS — Z01818 Encounter for other preprocedural examination: Secondary | ICD-10-CM

## 2015-05-24 DIAGNOSIS — G5602 Carpal tunnel syndrome, left upper limb: Secondary | ICD-10-CM | POA: Diagnosis present

## 2015-05-24 DIAGNOSIS — Z96653 Presence of artificial knee joint, bilateral: Secondary | ICD-10-CM | POA: Diagnosis not present

## 2015-05-24 DIAGNOSIS — J0110 Acute frontal sinusitis, unspecified: Secondary | ICD-10-CM

## 2015-05-24 DIAGNOSIS — I10 Essential (primary) hypertension: Secondary | ICD-10-CM | POA: Diagnosis not present

## 2015-05-24 DIAGNOSIS — M65312 Trigger thumb, left thumb: Secondary | ICD-10-CM | POA: Diagnosis not present

## 2015-05-24 DIAGNOSIS — E119 Type 2 diabetes mellitus without complications: Secondary | ICD-10-CM | POA: Insufficient documentation

## 2015-05-24 DIAGNOSIS — F17290 Nicotine dependence, other tobacco product, uncomplicated: Secondary | ICD-10-CM | POA: Insufficient documentation

## 2015-05-24 HISTORY — PX: TRIGGER FINGER RELEASE: SHX641

## 2015-05-24 HISTORY — DX: Carpal tunnel syndrome, left upper limb: G56.02

## 2015-05-24 HISTORY — PX: CARPAL TUNNEL RELEASE: SHX101

## 2015-05-24 LAB — GLUCOSE, CAPILLARY
Glucose-Capillary: 130 mg/dL — ABNORMAL HIGH (ref 65–99)
Glucose-Capillary: 132 mg/dL — ABNORMAL HIGH (ref 65–99)

## 2015-05-24 LAB — COMPREHENSIVE METABOLIC PANEL
ALK PHOS: 98 U/L (ref 38–126)
ALT: 20 U/L (ref 17–63)
ANION GAP: 12 (ref 5–15)
AST: 28 U/L (ref 15–41)
Albumin: 3.3 g/dL — ABNORMAL LOW (ref 3.5–5.0)
BUN: 12 mg/dL (ref 6–20)
CALCIUM: 9.1 mg/dL (ref 8.9–10.3)
CHLORIDE: 102 mmol/L (ref 101–111)
CO2: 24 mmol/L (ref 22–32)
CREATININE: 0.86 mg/dL (ref 0.61–1.24)
Glucose, Bld: 153 mg/dL — ABNORMAL HIGH (ref 65–99)
Potassium: 4.2 mmol/L (ref 3.5–5.1)
Sodium: 138 mmol/L (ref 135–145)
Total Bilirubin: 0.4 mg/dL (ref 0.3–1.2)
Total Protein: 7.2 g/dL (ref 6.5–8.1)

## 2015-05-24 LAB — URINALYSIS, ROUTINE W REFLEX MICROSCOPIC
BILIRUBIN URINE: NEGATIVE
GLUCOSE, UA: NEGATIVE mg/dL
Hgb urine dipstick: NEGATIVE
KETONES UR: NEGATIVE mg/dL
Leukocytes, UA: NEGATIVE
Nitrite: NEGATIVE
PH: 7.5 (ref 5.0–8.0)
Protein, ur: NEGATIVE mg/dL
Specific Gravity, Urine: 1.018 (ref 1.005–1.030)

## 2015-05-24 LAB — CBC
HCT: 33.6 % — ABNORMAL LOW (ref 39.0–52.0)
Hemoglobin: 9.9 g/dL — ABNORMAL LOW (ref 13.0–17.0)
MCH: 20.1 pg — AB (ref 26.0–34.0)
MCHC: 29.5 g/dL — AB (ref 30.0–36.0)
MCV: 68.3 fL — AB (ref 78.0–100.0)
PLATELETS: 244 10*3/uL (ref 150–400)
RBC: 4.92 MIL/uL (ref 4.22–5.81)
RDW: 20.3 % — ABNORMAL HIGH (ref 11.5–15.5)
WBC: 9.5 10*3/uL (ref 4.0–10.5)

## 2015-05-24 LAB — APTT: APTT: 28 s (ref 24–37)

## 2015-05-24 LAB — PROTIME-INR
INR: 0.96 (ref 0.00–1.49)
PROTHROMBIN TIME: 13 s (ref 11.6–15.2)

## 2015-05-24 SURGERY — RELEASE, A1 PULLEY, FOR TRIGGER FINGER
Anesthesia: Choice | Laterality: Left

## 2015-05-24 MED ORDER — OXYCODONE HCL 5 MG/5ML PO SOLN: 5.0000 mg | Freq: Once | ORAL | Status: DC | PRN

## 2015-05-24 MED ORDER — METHOCARBAMOL 500 MG PO TABS: 500.0000 mg | ORAL_TABLET | Freq: Four times a day (QID) | ORAL | Status: DC | PRN

## 2015-05-24 MED ORDER — BUPIVACAINE HCL (PF) 0.25 % IJ SOLN
INTRAMUSCULAR | Status: DC | PRN
  Administered 2015-05-24: 14 mL

## 2015-05-24 MED ORDER — ONDANSETRON HCL 4 MG/2ML IJ SOLN: 4.0000 mg | Freq: Once | INTRAMUSCULAR | Status: DC | PRN

## 2015-05-24 MED ORDER — CHLORHEXIDINE GLUCONATE 4 % EX LIQD: 60.0000 mL | Freq: Once | CUTANEOUS | Status: DC

## 2015-05-24 MED ORDER — LIDOCAINE HCL (CARDIAC) 20 MG/ML IV SOLN
INTRAVENOUS | Status: AC
  Filled 2015-05-24: qty 5

## 2015-05-24 MED ORDER — MIDAZOLAM HCL 2 MG/2ML IJ SOLN
INTRAMUSCULAR | Status: DC | PRN
  Administered 2015-05-24: 0.5 mg via INTRAVENOUS
  Administered 2015-05-24: 1.5 mg via INTRAVENOUS

## 2015-05-24 MED ORDER — PROPOFOL 10 MG/ML IV BOLUS
INTRAVENOUS | Status: AC
  Filled 2015-05-24: qty 20

## 2015-05-24 MED ORDER — HYDROMORPHONE HCL 1 MG/ML IJ SOLN: 0.2500 mg | INTRAMUSCULAR | Status: DC | PRN

## 2015-05-24 MED ORDER — SODIUM CHLORIDE 0.9 % IJ SOLN
INTRAMUSCULAR | Status: AC
  Filled 2015-05-24: qty 10

## 2015-05-24 MED ORDER — FENTANYL CITRATE (PF) 250 MCG/5ML IJ SOLN
INTRAMUSCULAR | Status: AC
  Filled 2015-05-24: qty 5

## 2015-05-24 MED ORDER — ROCURONIUM BROMIDE 50 MG/5ML IV SOLN
INTRAVENOUS | Status: AC
  Filled 2015-05-24: qty 1

## 2015-05-24 MED ORDER — OXYCODONE HCL 30 MG PO TABS: 30.0000 mg | ORAL_TABLET | ORAL | Status: DC | PRN

## 2015-05-24 MED ORDER — ONDANSETRON HCL 4 MG/2ML IJ SOLN
INTRAMUSCULAR | Status: AC
  Filled 2015-05-24: qty 2

## 2015-05-24 MED ORDER — LACTATED RINGERS IV SOLN
INTRAVENOUS | Status: DC | PRN
  Administered 2015-05-24: 10:00:00 via INTRAVENOUS

## 2015-05-24 MED ORDER — PHENYLEPHRINE 40 MCG/ML (10ML) SYRINGE FOR IV PUSH (FOR BLOOD PRESSURE SUPPORT)
PREFILLED_SYRINGE | INTRAVENOUS | Status: AC
  Filled 2015-05-24: qty 10

## 2015-05-24 MED ORDER — MIDAZOLAM HCL 2 MG/2ML IJ SOLN
INTRAMUSCULAR | Status: AC
  Filled 2015-05-24: qty 2

## 2015-05-24 MED ORDER — OXYCODONE HCL 5 MG PO TABS: 30.0000 mg | ORAL_TABLET | ORAL | Status: DC | PRN

## 2015-05-24 MED ORDER — LIDOCAINE HCL (PF) 0.5 % IJ SOLN
INTRAMUSCULAR | Status: DC | PRN
  Administered 2015-05-24: 30 mL via INTRAVENOUS

## 2015-05-24 MED ORDER — EPHEDRINE SULFATE 50 MG/ML IJ SOLN
INTRAMUSCULAR | Status: AC
  Filled 2015-05-24: qty 1

## 2015-05-24 MED ORDER — BUPIVACAINE HCL (PF) 0.25 % IJ SOLN
INTRAMUSCULAR | Status: AC
  Filled 2015-05-24: qty 30

## 2015-05-24 MED ORDER — METHOCARBAMOL 1000 MG/10ML IJ SOLN: 500.0000 mg | Freq: Four times a day (QID) | INTRAVENOUS | Status: DC | PRN

## 2015-05-24 MED ORDER — PROPOFOL 500 MG/50ML IV EMUL
INTRAVENOUS | Status: DC | PRN
  Administered 2015-05-24: 50 ug/kg/min via INTRAVENOUS

## 2015-05-24 MED ORDER — 0.9 % SODIUM CHLORIDE (POUR BTL) OPTIME
TOPICAL | Status: DC | PRN
Start: 2015-05-24 — End: 2015-05-24
  Administered 2015-05-24: 1000 mL

## 2015-05-24 MED ORDER — OXYCODONE HCL 5 MG PO TABS: 5.0000 mg | ORAL_TABLET | Freq: Once | ORAL | Status: DC | PRN

## 2015-05-24 MED ORDER — FENTANYL CITRATE (PF) 250 MCG/5ML IJ SOLN
INTRAMUSCULAR | Status: DC | PRN
  Administered 2015-05-24: 100 ug via INTRAVENOUS

## 2015-05-24 SURGICAL SUPPLY — 47 items
BANDAGE ELASTIC 3 VELCRO ST LF (GAUZE/BANDAGES/DRESSINGS) ×3 IMPLANT
BNDG ESMARK 4X9 LF (GAUZE/BANDAGES/DRESSINGS) ×3 IMPLANT
BNDG GAUZE ELAST 4 BULKY (GAUZE/BANDAGES/DRESSINGS) ×3 IMPLANT
CLOSURE WOUND 1/2 X4 (GAUZE/BANDAGES/DRESSINGS) ×1
COVER SURGICAL LIGHT HANDLE (MISCELLANEOUS) ×3 IMPLANT
CUFF TOURNIQUET SINGLE 18IN (TOURNIQUET CUFF) IMPLANT
CUFF TOURNIQUET SINGLE 24IN (TOURNIQUET CUFF) IMPLANT
DERMABOND ADVANCED (GAUZE/BANDAGES/DRESSINGS) ×2
DERMABOND ADVANCED .7 DNX12 (GAUZE/BANDAGES/DRESSINGS) ×1 IMPLANT
DRAPE U-SHAPE 47X51 STRL (DRAPES) ×3 IMPLANT
DRSG EMULSION OIL 3X3 NADH (GAUZE/BANDAGES/DRESSINGS) ×3 IMPLANT
DURAPREP 26ML APPLICATOR (WOUND CARE) ×3 IMPLANT
ELECT REM PT RETURN 9FT ADLT (ELECTROSURGICAL) ×3
ELECTRODE REM PT RTRN 9FT ADLT (ELECTROSURGICAL) ×1 IMPLANT
GAUZE SPONGE 2X2 8PLY NS (GAUZE/BANDAGES/DRESSINGS) ×3 IMPLANT
GAUZE SPONGE 4X4 12PLY STRL (GAUZE/BANDAGES/DRESSINGS) ×3 IMPLANT
GAUZE XEROFORM 1X8 LF (GAUZE/BANDAGES/DRESSINGS) ×3 IMPLANT
GLOVE BIOGEL PI IND STRL 8 (GLOVE) ×1 IMPLANT
GLOVE BIOGEL PI INDICATOR 8 (GLOVE) ×2
GLOVE ECLIPSE 9.0 STRL (GLOVE) ×3 IMPLANT
GLOVE ORTHO TXT STRL SZ7.5 (GLOVE) ×3 IMPLANT
GLOVE SURG 8.5 LATEX PF (GLOVE) ×3 IMPLANT
GOWN STRL REUS W/ TWL LRG LVL3 (GOWN DISPOSABLE) ×1 IMPLANT
GOWN STRL REUS W/TWL 2XL LVL3 (GOWN DISPOSABLE) ×6 IMPLANT
GOWN STRL REUS W/TWL LRG LVL3 (GOWN DISPOSABLE) ×2
KIT BASIN OR (CUSTOM PROCEDURE TRAY) ×3 IMPLANT
KIT ROOM TURNOVER OR (KITS) ×3 IMPLANT
NEEDLE HYPO 25GX1X1/2 BEV (NEEDLE) ×3 IMPLANT
NS IRRIG 1000ML POUR BTL (IV SOLUTION) ×3 IMPLANT
PACK ORTHO EXTREMITY (CUSTOM PROCEDURE TRAY) ×3 IMPLANT
PAD ARMBOARD 7.5X6 YLW CONV (MISCELLANEOUS) ×6 IMPLANT
PAD CAST 4YDX4 CTTN HI CHSV (CAST SUPPLIES) ×1 IMPLANT
PADDING CAST COTTON 4X4 STRL (CAST SUPPLIES) ×2
SPONGE GAUZE 4X4 12PLY STER LF (GAUZE/BANDAGES/DRESSINGS) ×3 IMPLANT
SPONGE LAP 4X18 X RAY DECT (DISPOSABLE) IMPLANT
STRIP CLOSURE SKIN 1/2X4 (GAUZE/BANDAGES/DRESSINGS) ×2 IMPLANT
SUT ETHILON 4 0 PS 2 18 (SUTURE) IMPLANT
SUT PROLENE 3 0 PS 2 (SUTURE) IMPLANT
SUT VIC AB 3-0 PS2 18 (SUTURE)
SUT VIC AB 3-0 PS2 18XBRD (SUTURE) IMPLANT
SYR CONTROL 10ML LL (SYRINGE) ×3 IMPLANT
TOWEL OR 17X24 6PK STRL BLUE (TOWEL DISPOSABLE) ×3 IMPLANT
TOWEL OR 17X26 10 PK STRL BLUE (TOWEL DISPOSABLE) ×3 IMPLANT
TUBE CONNECTING 12'X1/4 (SUCTIONS) ×1
TUBE CONNECTING 12X1/4 (SUCTIONS) ×2 IMPLANT
UNDERPAD 30X30 INCONTINENT (UNDERPADS AND DIAPERS) ×3 IMPLANT
WATER STERILE IRR 1000ML POUR (IV SOLUTION) IMPLANT

## 2015-05-24 NOTE — Anesthesia Procedure Notes (Addendum)
Anesthesia Regional Block:  Bier block (IV Regional)  Pre-Anesthetic Checklist: ,, timeout performed, Correct Patient, Correct Site, Correct Laterality, Correct Procedure, Correct Position, site marked, Risks and benefits discussed, Surgical consent,  Pre-op evaluation,  At surgeon's request  Laterality: Left  Prep: chloraprep       Needles:       Needle Gauge: 20 and 20 G    Additional Needles: Bier block (IV Regional) Narrative:  Injection made incrementally with aspirations every 5 mL.  Performed by: Personally   Additional Notes: 20G PIV placed in left hand. 74m of 0.5% lidocaine PF injected without difficulty.     Procedure Name: MAC Date/Time: 05/24/2015 9:45 AM Performed by: SLayla MawPre-anesthesia Checklist: Patient identified, Patient being monitored, Timeout performed, Emergency Drugs available and Suction available Patient Re-evaluated:Patient Re-evaluated prior to inductionOxygen Delivery Method: Simple face mask Preoxygenation: Pre-oxygenation with 100% oxygen Number of attempts: 1 Placement Confirmation: positive ETCO2 Dental Injury: Teeth and Oropharynx as per pre-operative assessment

## 2015-05-24 NOTE — Discharge Instructions (Signed)
Keep dressing dry. Elevated wrist above heart. No ice to the area of the surgery. After three day remove the dressing and apply a bandaid. May wet after 3 days with bandaid in place.  Return to office in ten days for removal of sutures left thumb. Call if any drainage, redness or worsening swelling.    Keep dressing dry. Elevated wrist above heart. Apply ice to palm side of wrist two hours on and one half hour off for 48 hours. May Apply ice at night and go to sleep with out changing. Be sure to keep ice off fingers to prevent frost bite.  Return to office in ten days for removal of sutures left wrist.

## 2015-05-24 NOTE — Anesthesia Preprocedure Evaluation (Addendum)
Anesthesia Evaluation  Patient identified by MRN, date of birth, ID band Patient awake    Reviewed: Allergy & Precautions, NPO status , Patient's Chart, lab work & pertinent test results  Airway Mallampati: II       Dental   Pulmonary Current Smoker,    breath sounds clear to auscultation       Cardiovascular hypertension,  Rhythm:Regular Rate:Normal     Neuro/Psych    GI/Hepatic   Endo/Other  diabetes  Renal/GU      Musculoskeletal   Abdominal   Peds  Hematology   Anesthesia Other Findings   Reproductive/Obstetrics                            Anesthesia Physical Anesthesia Plan  ASA: III  Anesthesia Plan: MAC and Bier Block   Post-op Pain Management:    Induction: Intravenous  Airway Management Planned: Simple Face Mask and Natural Airway  Additional Equipment:   Intra-op Plan:   Post-operative Plan:   Informed Consent: I have reviewed the patients History and Physical, chart, labs and discussed the procedure including the risks, benefits and alternatives for the proposed anesthesia with the patient or authorized representative who has indicated his/her understanding and acceptance.     Plan Discussed with: CRNA and Anesthesiologist  Anesthesia Plan Comments:         Anesthesia Quick Evaluation

## 2015-05-24 NOTE — Telephone Encounter (Signed)
Attempted to reach patient on home phone; mailbox full. Will call patient later to inform of 06/04/15 FU appointment time with Dr Leta Baptist. Time has been changed from 8:30 am to 10:30 am on 06/04/15 due to provider being in meeting that morning.

## 2015-05-24 NOTE — Transfer of Care (Signed)
Immediate Anesthesia Transfer of Care Note  Patient: Phillip Rocha  Procedure(s) Performed: Procedure(s): RELEASE LEFT TRIGGER THUMB AND LEFT CARPAL TUNNEL RELEASE (Left) CARPAL TUNNEL RELEASE (Left)  Patient Location: PACU  Anesthesia Type:MAC combined with regional for post-op pain  Level of Consciousness: awake, alert , oriented and patient cooperative  Airway & Oxygen Therapy: Patient Spontanous Breathing and Patient connected to face mask oxygen  Post-op Assessment: Report given to RN, Post -op Vital signs reviewed and stable and Patient moving all extremities X 4  Post vital signs: Reviewed and stable  Last Vitals:  Filed Vitals:   05/24/15 0828 05/24/15 1101  BP:  93/71  Pulse: 98 89  Temp: 37.1 C 36.6 C  Resp: 18 19    Complications: No apparent anesthesia complications

## 2015-05-24 NOTE — Op Note (Signed)
05/24/2015  10:56 AM  PATIENT:  Phillip Rocha  55 y.o. male  MRN: [id number]  PRE-OPERATIVE DIAGNOSIS:  left trigger thumb, left carpal tunnel syndrome  POST-OPERATIVE DIAGNOSIS:  left trigger thumb, left carpal tunnel syndrome  PROCEDURE:  Procedure(s): RELEASE LEFT TRIGGER THUMB AND LEFT CARPAL TUNNEL RELEASE CARPAL TUNNEL RELEASE   OPERATIVE REPORT     SURGEON:  Jessy Oto, MD      ASSISTANT:  Benjiman Core, PA-C  (Present throughout the entire procedure and necessary for completion of procedure in a timely manner)      ANESTHESIA:  Regional Bier Block  Left forearm Level, Supplemented with local Marcaine 0.5 % 15cc, Dr. Linna Caprice     COMPLICATIONS:  None.      TOURNIQUET TIME: 35 minutes at 269mHg   PROCEDURE: The patient was met in the holding area, and the appropriate left wrist and left volar thumb MCP identified and marked.The patient was then transported to OR and was placed on the operative table in a supine position. The patient was then placed under left forearm Bier block anesthesia without difficulty. The patient received appropriate preoperative antibiotic prophylaxis.     The left upper extremity was then prepped using sterile conditions and draped using sterile technique.  Time-out procedure was called and correct.  The skin overlying the distal palmar crease at the level of the left thumb A-1 pulley (MCP joint) was infiltrated with 5 cc of marcaine 1/4% plain. A transverse incision was then made over the left thumb MCP joint at the level of the distal palmar crease. Incision through skin and dermis only and subcutaneous layers spread in the midline with a hemostat down to the flexor tendon sheath overlying the long MCP joint. A1 pulley identified. Patient had a very thick band representing the proximal margin of the A-1 pulley double ended retractors were used on both sides retracting the digital nerves. A Stevens scissors then used to incise the flexor tendon  sheath and the A1 pulley overlying the thumb MCP joint longitudinally incising through the thickened portion of the A1 pulley and dividing it until it was freed distally as well as proximally. Following inspection of the flexor tendons and determining that release was completed.The tendons and shows some symmetric swelling with no significant flexor tendon swelling that would represent tendinous injury or previous old injury. Following irrigation and then the incision was closed with 2 interrupted 4-0 nylon sutures in horizontal mattress fashion and Dermabond applied. Using loope magnification and head lamp a 1.5 inch incision curved at the left wrist crease with 15 blade scalpel.  Incision through skin and subcutaneous tissue to the volar forearm fascia and  transverse carpal ligament. Fascia then carefully lifed and incised with Stevens scissors inline with the fourth digit. The skin and subcutaneous tissue retracted and the volar fascia divided under direct vision from distal to proximal. A freer elevator then carefully placed between the median nerve and the transverse carpal ligament protecting the  median nerve as the transverse carpal ligament was divided with a 15 blade scalpel in line with the fourth digit. Retracting the distal skin and subcutaneous tissues distally under direct visualization the remaining portions of the transverse carpal ligament were divided with tenotomy scissors again in line with the fourth digit. The palmar fascia was then divided until the traversing superficial palmar arch was identified and preserved intact.  The motor branch of the median nerve was carefully examined and identified intact. Tourniquet was then released. Bleeding controlled with  bipolar electrocautery. The incision was then irrigated with copious amounts of irrigant solution, No active bleeding was present. The incision closed with a single layer skin closure of 4-0 nylon horizontal mattress  sutures.Dermabond then used to seal the skin.  Dry dressing of adaptic, 4x4s held in place with sterile webril. 4x4s then used to dress the volar left thumb MCP incision site.  A well padded volar left forearm splint applied with ace wrap.  The patient reactivated and returned to the PACU in good condition.  All instruments and sponge counts were correct.   Benjiman Core, PA-C perform the duties of assistant surgeon he performed careful retraction of soft tissues assisted in addition the patient had removal on the OR table is present beginning the case the case and performed retration of the incision and application of dressing.       NITKA,JAMES E  05/24/2015, 10:56 AM

## 2015-05-24 NOTE — Interval H&P Note (Signed)
The patient has been re-examined, and the chart reviewed, and there have been no interval changes to the documented history and physical.    The risks, benefits, and alternatives have been discussed at length, and the patient is willing to proceed.

## 2015-05-24 NOTE — Anesthesia Postprocedure Evaluation (Signed)
Anesthesia Post Note  Patient: Phillip Rocha  Procedure(s) Performed: Procedure(s) (LRB): RELEASE LEFT TRIGGER THUMB AND LEFT CARPAL TUNNEL RELEASE (Left) CARPAL TUNNEL RELEASE (Left)  Patient location during evaluation: PACU Anesthesia Type: MAC and Bier Block Level of consciousness: awake, awake and alert and oriented Pain management: pain level controlled Vital Signs Assessment: post-procedure vital signs reviewed and stable Respiratory status: spontaneous breathing, nonlabored ventilation and respiratory function stable Cardiovascular status: blood pressure returned to baseline Anesthetic complications: no    Last Vitals:  Filed Vitals:   05/24/15 1115 05/24/15 1120  BP: 124/78 124/68  Pulse: 84 85  Temp:  36.7 C  Resp: 19 21    Last Pain:  Filed Vitals:   05/24/15 1122  PainSc: 0-No pain                 Miking Usrey COKER
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
## 2015-05-24 NOTE — Brief Op Note (Signed)
PATIENT ID:      TREVONNE NYLAND  MRN:     [id number] DOB/AGE:    1960-12-05 / 55 y.o.       OPERATIVE REPORT   DATE OF PROCEDURE:  05/24/2015      PREOPERATIVE DIAGNOSIS:   left trigger thumb, left carpal tunnel syndrome                                                       Body mass index is 45.69 kg/(m^2).    POSTOPERATIVE DIAGNOSIS:   left trigger thumb, left carpal tunnel syndrome                                                                     Body mass index is 45.69 kg/(m^2).    PROCEDURE:  Procedure(s): RELEASE LEFT TRIGGER THUMB AND LEFT CARPAL TUNNEL RELEASE CARPAL TUNNEL RELEASE    SURGEON: NITKA,JAMES E   ASSISTANT: Esaw Grandchild          ANESTHESIA:  General, Bier block, MAC combined with regional for post-op pain and infiltrated with local marcaine 0.25% total 15cc, Dr. Linna Caprice  EBL:5cc  DRAINS:None  TOURNIQUET TIME:60mn @ 2938HWHg  COMPLICATIONS:  None   CONDITION:  stable    NITKA,JAMES E 05/24/2015, 10:52 AM

## 2015-05-24 NOTE — Anesthesia Postprocedure Evaluation (Signed)
Anesthesia Post Note  Patient: JOANATHAN AFFELDT  Procedure(s) Performed: Procedure(s) (LRB): RELEASE LEFT TRIGGER THUMB AND LEFT CARPAL TUNNEL RELEASE (Left) CARPAL TUNNEL RELEASE (Left)  Patient location during evaluation: PACU Anesthesia Type: Bier Block and MAC Level of consciousness: awake, awake and alert, oriented and patient cooperative Pain management: pain level controlled Respiratory status: spontaneous breathing, nonlabored ventilation and respiratory function stable Cardiovascular status: blood pressure returned to baseline Anesthetic complications: no    Last Vitals:  Filed Vitals:   05/24/15 1115 05/24/15 1120  BP: 124/78 124/68  Pulse: 84 85  Temp:  36.7 C  Resp: 19 21    Last Pain:  Filed Vitals:   05/24/15 1122  PainSc: 0-No pain                 Attikus Bartoszek COKER

## 2015-05-24 NOTE — Telephone Encounter (Signed)
Spoke with patient who requested follow up be cancelled. He stated, "Is it about the headaches? I am all right now."  Patient ended the call. FU cancelled.

## 2015-05-25 ENCOUNTER — Encounter (HOSPITAL_COMMUNITY): Payer: Self-pay | Admitting: Specialist

## 2015-06-04 ENCOUNTER — Ambulatory Visit: Payer: Medicare Other | Admitting: Diagnostic Neuroimaging

## 2015-06-04 ENCOUNTER — Ambulatory Visit (INDEPENDENT_AMBULATORY_CARE_PROVIDER_SITE_OTHER): Payer: Medicare Other

## 2015-06-04 ENCOUNTER — Ambulatory Visit: Payer: Self-pay | Admitting: Diagnostic Neuroimaging

## 2015-06-04 DIAGNOSIS — J309 Allergic rhinitis, unspecified: Secondary | ICD-10-CM | POA: Diagnosis not present

## 2015-06-08 ENCOUNTER — Ambulatory Visit (HOSPITAL_COMMUNITY)
Admission: RE | Admit: 2015-06-08 | Discharge: 2015-06-08 | Disposition: A | Discharge status: Home / Self Care | Payer: Medicare Other | Source: Ambulatory Visit | Attending: Rheumatology | Admitting: Rheumatology

## 2015-06-08 ENCOUNTER — Other Ambulatory Visit (HOSPITAL_COMMUNITY): Payer: Self-pay | Admitting: Rheumatology

## 2015-06-08 DIAGNOSIS — R918 Other nonspecific abnormal finding of lung field: Secondary | ICD-10-CM | POA: Insufficient documentation

## 2015-06-08 DIAGNOSIS — T451X5A Adverse effect of antineoplastic and immunosuppressive drugs, initial encounter: Secondary | ICD-10-CM

## 2015-06-08 DIAGNOSIS — J9811 Atelectasis: Secondary | ICD-10-CM | POA: Insufficient documentation

## 2015-06-08 DIAGNOSIS — Z139 Encounter for screening, unspecified: Secondary | ICD-10-CM | POA: Insufficient documentation

## 2015-06-08 DIAGNOSIS — I517 Cardiomegaly: Secondary | ICD-10-CM | POA: Diagnosis not present

## 2015-06-09 ENCOUNTER — Ambulatory Visit (INDEPENDENT_AMBULATORY_CARE_PROVIDER_SITE_OTHER): Payer: Medicare Other

## 2015-06-09 ENCOUNTER — Other Ambulatory Visit (HOSPITAL_COMMUNITY): Payer: Self-pay | Admitting: Specialist

## 2015-06-09 DIAGNOSIS — M25512 Pain in left shoulder: Secondary | ICD-10-CM

## 2015-06-09 DIAGNOSIS — J309 Allergic rhinitis, unspecified: Secondary | ICD-10-CM

## 2015-06-11 ENCOUNTER — Ambulatory Visit (INDEPENDENT_AMBULATORY_CARE_PROVIDER_SITE_OTHER): Payer: Medicare Other | Admitting: *Deleted

## 2015-06-11 DIAGNOSIS — J309 Allergic rhinitis, unspecified: Secondary | ICD-10-CM

## 2015-06-14 ENCOUNTER — Ambulatory Visit (INDEPENDENT_AMBULATORY_CARE_PROVIDER_SITE_OTHER): Payer: Medicare Other

## 2015-06-14 DIAGNOSIS — J309 Allergic rhinitis, unspecified: Secondary | ICD-10-CM

## 2015-06-18 ENCOUNTER — Ambulatory Visit (INDEPENDENT_AMBULATORY_CARE_PROVIDER_SITE_OTHER): Payer: Medicare Other | Admitting: *Deleted

## 2015-06-18 DIAGNOSIS — J309 Allergic rhinitis, unspecified: Secondary | ICD-10-CM | POA: Diagnosis not present

## 2015-06-21 ENCOUNTER — Ambulatory Visit (INDEPENDENT_AMBULATORY_CARE_PROVIDER_SITE_OTHER): Payer: Medicare Other | Admitting: *Deleted

## 2015-06-21 DIAGNOSIS — J309 Allergic rhinitis, unspecified: Secondary | ICD-10-CM

## 2015-06-22 ENCOUNTER — Other Ambulatory Visit (HOSPITAL_COMMUNITY): Payer: Self-pay | Admitting: Specialist

## 2015-06-22 ENCOUNTER — Ambulatory Visit (HOSPITAL_COMMUNITY)
Admission: RE | Admit: 2015-06-22 | Discharge: 2015-06-22 | Disposition: A | Discharge status: Home / Self Care | Payer: Medicare Other | Source: Ambulatory Visit | Attending: Specialist | Admitting: Specialist

## 2015-06-22 DIAGNOSIS — M25512 Pain in left shoulder: Secondary | ICD-10-CM

## 2015-06-23 ENCOUNTER — Ambulatory Visit (HOSPITAL_COMMUNITY): Payer: Medicare Other

## 2015-06-25 ENCOUNTER — Ambulatory Visit (INDEPENDENT_AMBULATORY_CARE_PROVIDER_SITE_OTHER): Payer: Medicare Other | Admitting: *Deleted

## 2015-06-25 DIAGNOSIS — J309 Allergic rhinitis, unspecified: Secondary | ICD-10-CM

## 2015-06-28 ENCOUNTER — Ambulatory Visit (INDEPENDENT_AMBULATORY_CARE_PROVIDER_SITE_OTHER): Payer: Medicare Other | Admitting: *Deleted

## 2015-06-28 DIAGNOSIS — J309 Allergic rhinitis, unspecified: Secondary | ICD-10-CM | POA: Diagnosis not present

## 2015-07-01 ENCOUNTER — Ambulatory Visit (INDEPENDENT_AMBULATORY_CARE_PROVIDER_SITE_OTHER): Payer: Medicare Other

## 2015-07-01 DIAGNOSIS — J309 Allergic rhinitis, unspecified: Secondary | ICD-10-CM

## 2015-07-02 ENCOUNTER — Ambulatory Visit (HOSPITAL_COMMUNITY)
Admission: RE | Admit: 2015-07-02 | Discharge: 2015-07-02 | Disposition: A | Discharge status: Home / Self Care | Payer: Medicare Other | Source: Ambulatory Visit | Attending: Internal Medicine | Admitting: Internal Medicine

## 2015-07-02 ENCOUNTER — Other Ambulatory Visit (HOSPITAL_COMMUNITY): Payer: Self-pay | Admitting: Internal Medicine

## 2015-07-02 ENCOUNTER — Ambulatory Visit (HOSPITAL_COMMUNITY): Admission: RE | Admit: 2015-07-02 | Payer: Medicare Other | Source: Ambulatory Visit

## 2015-07-02 DIAGNOSIS — M79641 Pain in right hand: Secondary | ICD-10-CM

## 2015-07-02 DIAGNOSIS — X58XXXA Exposure to other specified factors, initial encounter: Secondary | ICD-10-CM | POA: Insufficient documentation

## 2015-07-02 DIAGNOSIS — S62616A Displaced fracture of proximal phalanx of right little finger, initial encounter for closed fracture: Secondary | ICD-10-CM | POA: Insufficient documentation

## 2015-07-05 ENCOUNTER — Ambulatory Visit: Payer: Self-pay | Admitting: Surgery
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
## 2015-07-06 ENCOUNTER — Ambulatory Visit (INDEPENDENT_AMBULATORY_CARE_PROVIDER_SITE_OTHER): Payer: Medicare Other

## 2015-07-06 DIAGNOSIS — J309 Allergic rhinitis, unspecified: Secondary | ICD-10-CM

## 2015-07-08 ENCOUNTER — Ambulatory Visit (INDEPENDENT_AMBULATORY_CARE_PROVIDER_SITE_OTHER): Payer: Medicare Other

## 2015-07-08 ENCOUNTER — Other Ambulatory Visit: Payer: Self-pay | Admitting: Specialist

## 2015-07-08 DIAGNOSIS — J309 Allergic rhinitis, unspecified: Secondary | ICD-10-CM

## 2015-07-08 DIAGNOSIS — G8929 Other chronic pain: Secondary | ICD-10-CM

## 2015-07-08 DIAGNOSIS — M545 Low back pain, unspecified: Secondary | ICD-10-CM

## 2015-07-09 ENCOUNTER — Other Ambulatory Visit: Payer: Self-pay | Admitting: Specialist

## 2015-07-09 ENCOUNTER — Encounter (HOSPITAL_BASED_OUTPATIENT_CLINIC_OR_DEPARTMENT_OTHER): Payer: Self-pay | Admitting: *Deleted

## 2015-07-09 DIAGNOSIS — M545 Low back pain: Principal | ICD-10-CM

## 2015-07-09 DIAGNOSIS — G8929 Other chronic pain: Secondary | ICD-10-CM

## 2015-07-09 NOTE — Progress Notes (Signed)
07/09/15 1049  OBSTRUCTIVE SLEEP APNEA  Have you ever been diagnosed with sleep apnea through a sleep study? No  Do you snore loudly (loud enough to be heard through closed doors)?  0  Do you often feel tired, fatigued, or sleepy during the daytime (such as falling asleep during driving or talking to someone)? 0  Has anyone observed you stop breathing during your sleep? 0  Do you have, or are you being treated for high blood pressure? 1  BMI more than 35 kg/m2? 1  Age > 50 (1-yes) 1  Neck circumference greater than:Male 16 inches or larger, Male 17inches or larger? 1  Male Gender (Yes=1) 1  Obstructive Sleep Apnea Score 5

## 2015-07-13 ENCOUNTER — Encounter (HOSPITAL_BASED_OUTPATIENT_CLINIC_OR_DEPARTMENT_OTHER)
Admission: RE | Admit: 2015-07-13 | Discharge: 2015-07-13 | Disposition: A | Discharge status: Home / Self Care | Payer: Medicare Other | Source: Ambulatory Visit | Attending: Surgery | Admitting: Surgery

## 2015-07-13 DIAGNOSIS — Z01812 Encounter for preprocedural laboratory examination: Secondary | ICD-10-CM | POA: Insufficient documentation

## 2015-07-13 DIAGNOSIS — D170 Benign lipomatous neoplasm of skin and subcutaneous tissue of head, face and neck: Secondary | ICD-10-CM | POA: Diagnosis not present

## 2015-07-13 DIAGNOSIS — M1990 Unspecified osteoarthritis, unspecified site: Secondary | ICD-10-CM | POA: Diagnosis not present

## 2015-07-13 DIAGNOSIS — G4730 Sleep apnea, unspecified: Secondary | ICD-10-CM | POA: Diagnosis not present

## 2015-07-13 DIAGNOSIS — Z7984 Long term (current) use of oral hypoglycemic drugs: Secondary | ICD-10-CM | POA: Diagnosis not present

## 2015-07-13 DIAGNOSIS — E119 Type 2 diabetes mellitus without complications: Secondary | ICD-10-CM | POA: Diagnosis not present

## 2015-07-13 DIAGNOSIS — E7800 Pure hypercholesterolemia, unspecified: Secondary | ICD-10-CM | POA: Diagnosis not present

## 2015-07-13 DIAGNOSIS — I10 Essential (primary) hypertension: Secondary | ICD-10-CM | POA: Diagnosis not present

## 2015-07-13 DIAGNOSIS — Z6841 Body Mass Index (BMI) 40.0 and over, adult: Secondary | ICD-10-CM | POA: Diagnosis not present

## 2015-07-13 DIAGNOSIS — K219 Gastro-esophageal reflux disease without esophagitis: Secondary | ICD-10-CM | POA: Diagnosis not present

## 2015-07-13 DIAGNOSIS — Z79899 Other long term (current) drug therapy: Secondary | ICD-10-CM | POA: Diagnosis not present

## 2015-07-13 DIAGNOSIS — M549 Dorsalgia, unspecified: Secondary | ICD-10-CM | POA: Diagnosis not present

## 2015-07-13 LAB — BASIC METABOLIC PANEL
ANION GAP: 10 (ref 5–15)
BUN: 9 mg/dL (ref 6–20)
CALCIUM: 9.2 mg/dL (ref 8.9–10.3)
CO2: 26 mmol/L (ref 22–32)
Chloride: 102 mmol/L (ref 101–111)
Creatinine, Ser: 0.84 mg/dL (ref 0.61–1.24)
GLUCOSE: 117 mg/dL — AB (ref 65–99)
POTASSIUM: 4.8 mmol/L (ref 3.5–5.1)
Sodium: 138 mmol/L (ref 135–145)

## 2015-07-13 NOTE — Progress Notes (Signed)
In for PAT visit and anesthesia consult. Evaluated by Dr Kalman Shan and ok for surgery 07/15/15 @ [address]. BMET sent.

## 2015-07-14 ENCOUNTER — Ambulatory Visit (INDEPENDENT_AMBULATORY_CARE_PROVIDER_SITE_OTHER): Payer: Medicare Other

## 2015-07-14 DIAGNOSIS — J309 Allergic rhinitis, unspecified: Secondary | ICD-10-CM | POA: Diagnosis not present

## 2015-07-15 ENCOUNTER — Ambulatory Visit (HOSPITAL_BASED_OUTPATIENT_CLINIC_OR_DEPARTMENT_OTHER)
Admission: RE | Admit: 2015-07-15 | Discharge: 2015-07-15 | Disposition: A | Discharge status: Home / Self Care | Payer: Medicare Other | Source: Ambulatory Visit | Attending: Surgery | Admitting: Surgery

## 2015-07-15 ENCOUNTER — Encounter (HOSPITAL_BASED_OUTPATIENT_CLINIC_OR_DEPARTMENT_OTHER): Payer: Self-pay

## 2015-07-15 ENCOUNTER — Encounter (HOSPITAL_BASED_OUTPATIENT_CLINIC_OR_DEPARTMENT_OTHER)
Admission: RE | Disposition: A | Discharge status: Home / Self Care | Payer: Self-pay | Source: Ambulatory Visit | Attending: Surgery

## 2015-07-15 ENCOUNTER — Ambulatory Visit (HOSPITAL_BASED_OUTPATIENT_CLINIC_OR_DEPARTMENT_OTHER): Payer: Medicare Other | Admitting: Certified Registered"

## 2015-07-15 DIAGNOSIS — D170 Benign lipomatous neoplasm of skin and subcutaneous tissue of head, face and neck: Secondary | ICD-10-CM | POA: Insufficient documentation

## 2015-07-15 DIAGNOSIS — E7800 Pure hypercholesterolemia, unspecified: Secondary | ICD-10-CM | POA: Diagnosis not present

## 2015-07-15 DIAGNOSIS — E119 Type 2 diabetes mellitus without complications: Secondary | ICD-10-CM | POA: Insufficient documentation

## 2015-07-15 DIAGNOSIS — I10 Essential (primary) hypertension: Secondary | ICD-10-CM | POA: Insufficient documentation

## 2015-07-15 DIAGNOSIS — Z7984 Long term (current) use of oral hypoglycemic drugs: Secondary | ICD-10-CM | POA: Insufficient documentation

## 2015-07-15 DIAGNOSIS — Z79899 Other long term (current) drug therapy: Secondary | ICD-10-CM | POA: Insufficient documentation

## 2015-07-15 DIAGNOSIS — Z6841 Body Mass Index (BMI) 40.0 and over, adult: Secondary | ICD-10-CM | POA: Insufficient documentation

## 2015-07-15 DIAGNOSIS — M549 Dorsalgia, unspecified: Secondary | ICD-10-CM | POA: Insufficient documentation

## 2015-07-15 DIAGNOSIS — K219 Gastro-esophageal reflux disease without esophagitis: Secondary | ICD-10-CM | POA: Insufficient documentation

## 2015-07-15 DIAGNOSIS — G4730 Sleep apnea, unspecified: Secondary | ICD-10-CM | POA: Insufficient documentation

## 2015-07-15 DIAGNOSIS — M1990 Unspecified osteoarthritis, unspecified site: Secondary | ICD-10-CM | POA: Insufficient documentation

## 2015-07-15 HISTORY — DX: Opioid dependence, uncomplicated: F11.20

## 2015-07-15 HISTORY — DX: Other chronic pain: G89.29

## 2015-07-15 HISTORY — DX: Low back pain: M54.5

## 2015-07-15 HISTORY — DX: Low back pain, unspecified: M54.50

## 2015-07-15 HISTORY — PX: CYST EXCISION: SHX5701

## 2015-07-15 LAB — GLUCOSE, CAPILLARY
GLUCOSE-CAPILLARY: 98 mg/dL (ref 65–99)
GLUCOSE-CAPILLARY: 112 mg/dL — AB (ref 65–99)

## 2015-07-15 SURGERY — CYST REMOVAL
Anesthesia: General | Site: Face

## 2015-07-15 MED ORDER — FENTANYL CITRATE (PF) 100 MCG/2ML IJ SOLN
INTRAMUSCULAR | Status: AC
  Filled 2015-07-15: qty 2

## 2015-07-15 MED ORDER — ONDANSETRON HCL 4 MG/2ML IJ SOLN
INTRAMUSCULAR | Status: DC | PRN
  Administered 2015-07-15: 4 mg via INTRAVENOUS

## 2015-07-15 MED ORDER — BUPIVACAINE-EPINEPHRINE 0.25% -1:200000 IJ SOLN
INTRAMUSCULAR | Status: DC | PRN
  Administered 2015-07-15: 2.5 mL

## 2015-07-15 MED ORDER — ONDANSETRON HCL 4 MG/2ML IJ SOLN
INTRAMUSCULAR | Status: AC
  Filled 2015-07-15: qty 2

## 2015-07-15 MED ORDER — ATROPINE SULFATE 1 MG/ML IJ SOLN
INTRAMUSCULAR | Status: AC
  Filled 2015-07-15: qty 1

## 2015-07-15 MED ORDER — FENTANYL CITRATE (PF) 100 MCG/2ML IJ SOLN
50.0000 ug | INTRAMUSCULAR | Status: DC | PRN
  Administered 2015-07-15: 100 ug via INTRAVENOUS

## 2015-07-15 MED ORDER — FENTANYL CITRATE (PF) 100 MCG/2ML IJ SOLN
25.0000 ug | INTRAMUSCULAR | Status: DC | PRN
  Administered 2015-07-15: 50 ug via INTRAVENOUS

## 2015-07-15 MED ORDER — PHENYLEPHRINE 40 MCG/ML (10ML) SYRINGE FOR IV PUSH (FOR BLOOD PRESSURE SUPPORT)
PREFILLED_SYRINGE | INTRAVENOUS | Status: AC
  Filled 2015-07-15: qty 10

## 2015-07-15 MED ORDER — LIDOCAINE HCL (CARDIAC) 20 MG/ML IV SOLN
INTRAVENOUS | Status: DC | PRN
  Administered 2015-07-15: 50 mg via INTRAVENOUS

## 2015-07-15 MED ORDER — CHLORHEXIDINE GLUCONATE 4 % EX LIQD: 1.0000 "application " | Freq: Once | CUTANEOUS | Status: DC

## 2015-07-15 MED ORDER — LACTATED RINGERS IV SOLN
INTRAVENOUS | Status: DC
  Administered 2015-07-15: 14:00:00 via INTRAVENOUS

## 2015-07-15 MED ORDER — MIDAZOLAM HCL 2 MG/2ML IJ SOLN
INTRAMUSCULAR | Status: AC
  Filled 2015-07-15: qty 2

## 2015-07-15 MED ORDER — DEXTROSE 5 % IV SOLN
3.0000 g | INTRAVENOUS | Status: AC
  Administered 2015-07-15: 3 g via INTRAVENOUS

## 2015-07-15 MED ORDER — GLYCOPYRROLATE 0.2 MG/ML IJ SOLN: 0.2000 mg | Freq: Once | INTRAMUSCULAR | Status: DC | PRN

## 2015-07-15 MED ORDER — TRAMADOL HCL 50 MG PO TABS: 50.0000 mg | ORAL_TABLET | Freq: Four times a day (QID) | ORAL | Status: DC | PRN

## 2015-07-15 MED ORDER — MIDAZOLAM HCL 2 MG/2ML IJ SOLN
1.0000 mg | INTRAMUSCULAR | Status: DC | PRN
  Administered 2015-07-15: 2 mg via INTRAVENOUS

## 2015-07-15 MED ORDER — LIDOCAINE 2% (20 MG/ML) 5 ML SYRINGE
INTRAMUSCULAR | Status: AC
  Filled 2015-07-15: qty 5

## 2015-07-15 MED ORDER — EPHEDRINE 5 MG/ML INJ
INTRAVENOUS | Status: AC
  Filled 2015-07-15: qty 10

## 2015-07-15 MED ORDER — PROPOFOL 10 MG/ML IV BOLUS
INTRAVENOUS | Status: AC
  Filled 2015-07-15: qty 20

## 2015-07-15 MED ORDER — OXYCODONE HCL 5 MG/5ML PO SOLN: 5.0000 mg | Freq: Once | ORAL | Status: DC | PRN

## 2015-07-15 MED ORDER — BUPIVACAINE-EPINEPHRINE (PF) 0.25% -1:200000 IJ SOLN
INTRAMUSCULAR | Status: AC
  Filled 2015-07-15: qty 30

## 2015-07-15 MED ORDER — CEFAZOLIN SODIUM-DEXTROSE 2-4 GM/100ML-% IV SOLN
INTRAVENOUS | Status: AC
  Filled 2015-07-15: qty 100

## 2015-07-15 MED ORDER — ONDANSETRON HCL 4 MG/2ML IJ SOLN: 4.0000 mg | Freq: Four times a day (QID) | INTRAMUSCULAR | Status: DC | PRN

## 2015-07-15 MED ORDER — DEXAMETHASONE SODIUM PHOSPHATE 4 MG/ML IJ SOLN
INTRAMUSCULAR | Status: DC | PRN
  Administered 2015-07-15: 8 mg via INTRAVENOUS

## 2015-07-15 MED ORDER — DEXAMETHASONE SODIUM PHOSPHATE 10 MG/ML IJ SOLN
INTRAMUSCULAR | Status: AC
  Filled 2015-07-15: qty 1

## 2015-07-15 MED ORDER — PROPOFOL 10 MG/ML IV BOLUS
INTRAVENOUS | Status: DC | PRN
  Administered 2015-07-15: 300 mg via INTRAVENOUS

## 2015-07-15 MED ORDER — OXYCODONE HCL 5 MG PO TABS: 5.0000 mg | ORAL_TABLET | Freq: Once | ORAL | Status: DC | PRN

## 2015-07-15 MED ORDER — SUCCINYLCHOLINE CHLORIDE 200 MG/10ML IV SOSY
PREFILLED_SYRINGE | INTRAVENOUS | Status: AC
  Filled 2015-07-15: qty 10

## 2015-07-15 MED ORDER — SCOPOLAMINE 1 MG/3DAYS TD PT72: 1.0000 | MEDICATED_PATCH | Freq: Once | TRANSDERMAL | Status: DC | PRN

## 2015-07-15 MED ORDER — CEFAZOLIN SODIUM 1-5 GM-% IV SOLN
INTRAVENOUS | Status: AC
  Filled 2015-07-15: qty 50

## 2015-07-15 SURGICAL SUPPLY — 39 items
BENZOIN TINCTURE PRP APPL 2/3 (GAUZE/BANDAGES/DRESSINGS) IMPLANT
BLADE SURG 10 STRL SS (BLADE) IMPLANT
BLADE SURG 15 STRL LF DISP TIS (BLADE) ×1 IMPLANT
BLADE SURG 15 STRL SS (BLADE) ×2
CANISTER SUCT 1200ML W/VALVE (MISCELLANEOUS) IMPLANT
CHLORAPREP W/TINT 26ML (MISCELLANEOUS) IMPLANT
CLOSURE WOUND 1/2 X4 (GAUZE/BANDAGES/DRESSINGS)
COVER BACK TABLE 60X90IN (DRAPES) ×3 IMPLANT
COVER MAYO STAND STRL (DRAPES) ×3 IMPLANT
DECANTER SPIKE VIAL GLASS SM (MISCELLANEOUS) IMPLANT
DRAPE LAPAROTOMY 100X72 PEDS (DRAPES) ×3 IMPLANT
DRAPE UTILITY XL STRL (DRAPES) ×3 IMPLANT
ELECT COATED BLADE 2.86 ST (ELECTRODE) ×3 IMPLANT
ELECT REM PT RETURN 9FT ADLT (ELECTROSURGICAL) ×3
ELECTRODE REM PT RTRN 9FT ADLT (ELECTROSURGICAL) ×1 IMPLANT
GLOVE BIO SURGEON STRL SZ8 (GLOVE) ×3 IMPLANT
GLOVE BIOGEL PI IND STRL 8 (GLOVE) ×2 IMPLANT
GLOVE BIOGEL PI INDICATOR 8 (GLOVE) ×4
GLOVE ECLIPSE 8.0 STRL XLNG CF (GLOVE) ×3 IMPLANT
GLOVE SURG SS PI 7.5 STRL IVOR (GLOVE) ×3 IMPLANT
GOWN STRL REUS W/ TWL LRG LVL3 (GOWN DISPOSABLE) ×2 IMPLANT
GOWN STRL REUS W/TWL LRG LVL3 (GOWN DISPOSABLE) ×4
LIQUID BAND (GAUZE/BANDAGES/DRESSINGS) ×3 IMPLANT
NEEDLE HYPO 25X1 1.5 SAFETY (NEEDLE) ×3 IMPLANT
NS IRRIG 1000ML POUR BTL (IV SOLUTION) IMPLANT
PACK BASIN DAY SURGERY FS (CUSTOM PROCEDURE TRAY) ×3 IMPLANT
PENCIL BUTTON HOLSTER BLD 10FT (ELECTRODE) ×3 IMPLANT
SPONGE LAP 4X18 X RAY DECT (DISPOSABLE) ×3 IMPLANT
STRIP CLOSURE SKIN 1/2X4 (GAUZE/BANDAGES/DRESSINGS) IMPLANT
SUT MON AB 4-0 PC3 18 (SUTURE) ×3 IMPLANT
SUT VICRYL 3-0 CR8 SH (SUTURE) ×3 IMPLANT
SUT VICRYL AB 3 0 TIES (SUTURE) IMPLANT
SYR CONTROL 10ML LL (SYRINGE) ×3 IMPLANT
TOWEL OR 17X24 6PK STRL BLUE (TOWEL DISPOSABLE) ×6 IMPLANT
TOWEL OR NON WOVEN STRL DISP B (DISPOSABLE) ×3 IMPLANT
TRAY DSU PREP LF (CUSTOM PROCEDURE TRAY) ×3 IMPLANT
TUBE CONNECTING 20'X1/4 (TUBING)
TUBE CONNECTING 20X1/4 (TUBING) IMPLANT
YANKAUER SUCT BULB TIP NO VENT (SUCTIONS) IMPLANT

## 2015-07-15 NOTE — Op Note (Signed)
Preoperative diagnosis: 2 cm forehead mass  Postoperative diagnosis: Same  Procedure: Excision of 2 cm forehead mass  Surgeon: Erroll Luna M.D.  Anesthesia: Gen. with 0.25% Sensorcaine local with epinephrine  EBL: Minimal  Specimen: Fatty tissue mass to pathology  Indications for procedure: The patient presents for excision of mass from his forehead. It has gotten larger and he desires excision.The procedure has been discussed with the patient.  Alternative therapies have been discussed with the patient.  Operative risks include bleeding,  Infection,  Organ injury,  Nerve injury,  Blood vessel injury,  DVT,  Pulmonary embolism,  Death,  And possible reoperation.  Medical management risks include worsening of present situation.  The success of the procedure is 50 -90 % at treating patients symptoms.  The patient understands and agrees to proceed.  Description of procedure: Patient met in holding area and questions are answered. He was taken back to the operating room and placed upon the OR table. After induction of general anesthesia the forehead was prepped and draped in sterile fashion and timeout was done. 0.25% Sensorcaine with epinephrine was infiltrated around the mass. A 2 cm incision was made over the mass in a fatty tumor was removed consistent with lipoma. Wound closure 3-0 Vicryl, 4 Monocryl and liquid adhesive. Hemostasis was achieved. All final counts found to be correct. The patient was extubated taken to recovery in satisfactory condition.

## 2015-07-15 NOTE — Interval H&P Note (Signed)
History and Physical Interval Note:  07/15/2015 1:45 PM  Phillip Rocha  has presented today for surgery, with the diagnosis of Cyst forehead  The various methods of treatment have been discussed with the patient and family. After consideration of risks, benefits and other options for treatment, the patient has consented to  Procedure(s): EXCISION CYST FOREHEAD (N/A) as a surgical intervention .  The patient's history has been reviewed, patient examined, no change in status, stable for surgery.  I have reviewed the patient's chart and labs.  Questions were answered to the patient's satisfaction.     Amulya Quintin A.
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
## 2015-07-15 NOTE — Anesthesia Procedure Notes (Signed)
Procedure Name: LMA Insertion Date/Time: 07/15/2015 3:15 PM Performed by: Melynda Ripple D Pre-anesthesia Checklist: Patient identified, Emergency Drugs available, Suction available and Patient being monitored Patient Re-evaluated:Patient Re-evaluated prior to inductionOxygen Delivery Method: Circle system utilized Preoxygenation: Pre-oxygenation with 100% oxygen Intubation Type: IV induction Ventilation: Mask ventilation without difficulty LMA: LMA inserted LMA Size: 5.0 Number of attempts: 1 Airway Equipment and Method: Bite block Placement Confirmation: positive ETCO2 Tube secured with: Tape Dental Injury: Teeth and Oropharynx as per pre-operative assessment

## 2015-07-15 NOTE — Anesthesia Postprocedure Evaluation (Signed)
Anesthesia Post Note  Patient: Phillip Rocha  Procedure(s) Performed: Procedure(s) (LRB): EXCISION CYST FOREHEAD (N/A)  Patient location during evaluation: PACU Anesthesia Type: General Level of consciousness: awake and alert Pain management: pain level controlled Vital Signs Assessment: post-procedure vital signs reviewed and stable Respiratory status: spontaneous breathing, nonlabored ventilation and respiratory function stable Cardiovascular status: blood pressure returned to baseline and stable Postop Assessment: no signs of nausea or vomiting Anesthetic complications: no    Last Vitals:  Filed Vitals:   07/15/15 1600 07/15/15 1615  BP: 125/75 135/79  Pulse: 77 79  Temp:    Resp: 13 16    Last Pain:  Filed Vitals:   07/15/15 1620  PainSc: 4                  Siomara Burkel A.

## 2015-07-15 NOTE — Discharge Instructions (Signed)
GENERAL SURGERY: POST OP INSTRUCTIONS  1. DIET: Follow a light bland diet the first 24 hours after arrival home, such as soup, liquids, crackers, etc.  Be sure to include lots of fluids daily.  Avoid fast food or heavy meals as your are more likely to get nauseated.   2. Take your usually prescribed home medications unless otherwise directed. 3. PAIN CONTROL: a. Pain is best controlled by a usual combination of three different methods TOGETHER: i. Ice/Heat ii. Over the counter pain medication iii. Prescription pain medication b. Most patients will experience some swelling and bruising around the incisions.  Ice packs or heating pads (30-60 minutes up to 6 times a day) will help. Use ice for the first few days to help decrease swelling and bruising, then switch to heat to help relax tight/sore spots and speed recovery.  Some people prefer to use ice alone, heat alone, alternating between ice & heat.  Experiment to what works for you.  Swelling and bruising can take several weeks to resolve.   c. It is helpful to take an over-the-counter pain medication regularly for the first few weeks.  Choose one of the following that works best for you: i. Naproxen (Aleve, etc)  Two 288m tabs twice a day ii. Ibuprofen (Advil, etc) Three 2027mtabs four times a day (every meal & bedtime) iii. Acetaminophen (Tylenol, etc) 500-65049mour times a day (every meal & bedtime) d. A  prescription for pain medication (such as oxycodone, hydrocodone, etc) should be given to you upon discharge.  Take your pain medication as prescribed.  i. If you are having problems/concerns with the prescription medicine (does not control pain, nausea, vomiting, rash, itching, etc), please call us [phone] see if we need to switch you to a different pain medicine that will work better for you and/or control your side effect better. ii. If you need a refill on your pain medication, please contact your pharmacy.  They will contact our  office to request authorization. Prescriptions will not be filled after 5 pm or on week-ends. 4. Avoid getting constipated.  Between the surgery and the pain medications, it is common to experience some constipation.  Increasing fluid intake and taking a fiber supplement (such as Metamucil, Citrucel, FiberCon, MiraLax, etc) 1-2 times a day regularly will usually help prevent this problem from occurring.  A mild laxative (prune juice, Milk of Magnesia, MiraLax, etc) should be taken according to package directions if there are no bowel movements after 48 hours.   5. Wash / shower every day.  You may shower over the dressings as they are waterproof.  Continue to shower over incision(s) after the dressing is off. 6. Remove your waterproof bandages 5 days after surgery.  You may leave the incision open to air.  You may have skin tapes (Steri Strips) covering the incision(s).  Leave them on until one week, then remove.  You may replace a dressing/Band-Aid to cover the incision for comfort if you wish.      7. ACTIVITIES as tolerated:   a. You may resume regular (light) daily activities beginning the next day--such as daily self-care, walking, climbing stairs--gradually increasing activities as tolerated.  If you can walk 30 minutes without difficulty, it is safe to try more intense activity such as jogging, treadmill, bicycling, low-impact aerobics, swimming, etc. b. Save the most intensive and strenuous activity for last such as sit-ups, heavy lifting, contact sports, etc  Refrain from any heavy lifting or straining until you  are off narcotics for pain control.   c. DO NOT PUSH THROUGH PAIN.  Let pain be your guide: If it hurts to do something, don't do it.  Pain is your body warning you to avoid that activity for another week until the pain goes down. d. You may drive when you are no longer taking prescription pain medication, you can comfortably wear a seatbelt, and you can safely maneuver your car and  apply brakes. e. Dennis Bast may have sexual intercourse when it is comfortable.  8. FOLLOW UP in our office a. Please call [facility] at [phone] to set up an appointment to see your surgeon in the office for a follow-up appointment approximately 2-3 weeks after your surgery. b. Make sure that you call for this appointment the day you arrive home to insure a convenient appointment time. 9. IF YOU HAVE DISABILITY OR FAMILY LEAVE FORMS, BRING THEM TO THE OFFICE FOR PROCESSING.  DO NOT GIVE THEM TO YOUR DOCTOR.   WHEN TO CALL us [phone]: 1. Poor pain control 2. Reactions / problems with new medications (rash/itching, nausea, etc)  3. Fever over 101.5 F (38.5 C) 4. Worsening swelling or bruising 5. Continued bleeding from incision. 6. Increased pain, redness, or drainage from the incision 7. Difficulty breathing / swallowing   The clinic staff is available to answer your questions during regular business hours (8:30am-5pm).  Please dont hesitate to call and ask to speak to one of our nurses for clinical concerns.   If you have a medical emergency, go to the nearest emergency room or call 911.  A surgeon from [facility] is always on call at the [facility], [place], [place], [place], [place]  [address] ? MAIN: [phone] ? TOLL FREE: [phone] ?  FAX [phone] [email or web address]  Post Anesthesia Home Care Instructions  Activity: Get plenty of rest for the remainder of the day. A responsible adult should stay with you for 24 hours following the procedure.  For the next 24 hours, DO NOT: -Drive a car -Paediatric nurse -Drink alcoholic beverages -Take any medication unless instructed by your physician -Make any legal decisions or sign important papers.  Meals: Start with liquid foods such as gelatin or soup. Progress to regular foods as tolerated. Avoid greasy, spicy, heavy foods. If nausea and/or  vomiting occur, drink only clear liquids until the nausea and/or vomiting subsides. Call your physician if vomiting continues.  Special Instructions/Symptoms: Your throat may feel dry or sore from the anesthesia or the breathing tube placed in your throat during surgery. If this causes discomfort, gargle with warm salt water. The discomfort should disappear within 24 hours.  If you had a scopolamine patch placed behind your ear for the management of post- operative nausea and/or vomiting:  1. The medication in the patch is effective for 72 hours, after which it should be removed.  Wrap patch in a tissue and discard in the trash. Wash hands thoroughly with soap and water. 2. You may remove the patch earlier than 72 hours if you experience unpleasant side effects which may include dry mouth, dizziness or visual disturbances. 3. Avoid touching the patch. Wash your hands with soap and water after contact with the patch.

## 2015-07-15 NOTE — Transfer of Care (Signed)
Immediate Anesthesia Transfer of Care Note  Patient: Phillip Rocha  Procedure(s) Performed: Procedure(s) with comments: EXCISION CYST FOREHEAD (N/A) - Forehead  Patient Location: PACU  Anesthesia Type:General  Level of Consciousness: awake, alert  and oriented  Airway & Oxygen Therapy: Patient Spontanous Breathing and Patient connected to face mask oxygen  Post-op Assessment: Report given to RN and Post -op Vital signs reviewed and stable  Post vital signs: Reviewed and stable  Last Vitals:  Filed Vitals:   07/15/15 1311  BP: 116/53  Pulse: 84  Temp: 36.6 C  Resp: 20    Last Pain:  Filed Vitals:   07/15/15 1313  PainSc: 1          Complications: No apparent anesthesia complications

## 2015-07-15 NOTE — Anesthesia Preprocedure Evaluation (Signed)
Anesthesia Evaluation  Patient identified by MRN, date of birth, ID band Patient awake    Reviewed: Allergy & Precautions, NPO status , Patient's Chart, lab work & pertinent test results  Airway Mallampati: II   Neck ROM: full    Dental   Pulmonary shortness of breath, sleep apnea ,    breath sounds clear to auscultation       Cardiovascular hypertension,  Rhythm:regular Rate:Normal     Neuro/Psych  Headaches, Anxiety Depression  Neuromuscular disease    GI/Hepatic GERD  ,  Endo/Other  diabetes, Type 2Morbid obesity  Renal/GU      Musculoskeletal  (+) Arthritis ,   Abdominal   Peds  Hematology   Anesthesia Other Findings   Reproductive/Obstetrics                             Anesthesia Physical Anesthesia Plan  ASA: III  Anesthesia Plan: General   Post-op Pain Management:    Induction: Intravenous  Airway Management Planned: LMA  Additional Equipment:   Intra-op Plan:   Post-operative Plan:   Informed Consent: I have reviewed the patients History and Physical, chart, labs and discussed the procedure including the risks, benefits and alternatives for the proposed anesthesia with the patient or authorized representative who has indicated his/her understanding and acceptance.     Plan Discussed with: CRNA, Anesthesiologist and Surgeon  Anesthesia Plan Comments:         Anesthesia Quick Evaluation

## 2015-07-16 ENCOUNTER — Ambulatory Visit (INDEPENDENT_AMBULATORY_CARE_PROVIDER_SITE_OTHER): Payer: Medicare Other | Admitting: *Deleted

## 2015-07-16 ENCOUNTER — Encounter (HOSPITAL_BASED_OUTPATIENT_CLINIC_OR_DEPARTMENT_OTHER): Payer: Self-pay | Admitting: Surgery

## 2015-07-16 DIAGNOSIS — J309 Allergic rhinitis, unspecified: Secondary | ICD-10-CM

## 2015-07-16 NOTE — Addendum Note (Signed)
Addendum  created 07/16/15 1156 by Tawni Millers, CRNA   Modules edited: Charges VN

## 2015-07-19 ENCOUNTER — Ambulatory Visit
Admission: RE | Admit: 2015-07-19 | Discharge: 2015-07-19 | Disposition: A | Discharge status: Home / Self Care | Payer: Medicare Other | Source: Ambulatory Visit | Attending: Specialist | Admitting: Specialist

## 2015-07-19 VITALS — BP 144/72 | HR 74

## 2015-07-19 DIAGNOSIS — G8929 Other chronic pain: Secondary | ICD-10-CM

## 2015-07-19 DIAGNOSIS — M545 Low back pain: Secondary | ICD-10-CM

## 2015-07-19 DIAGNOSIS — M48062 Spinal stenosis, lumbar region with neurogenic claudication: Secondary | ICD-10-CM

## 2015-07-19 DIAGNOSIS — M4316 Spondylolisthesis, lumbar region: Secondary | ICD-10-CM

## 2015-07-19 DIAGNOSIS — M5416 Radiculopathy, lumbar region: Secondary | ICD-10-CM

## 2015-07-19 MED ORDER — DIAZEPAM 5 MG PO TABS
10.0000 mg | ORAL_TABLET | Freq: Once | ORAL | Status: AC
Start: 2015-07-19 — End: 2015-07-19
  Administered 2015-07-19: 10 mg via ORAL

## 2015-07-19 MED ORDER — ONDANSETRON HCL 4 MG/2ML IJ SOLN
4.0000 mg | Freq: Once | INTRAMUSCULAR | Status: AC
  Administered 2015-07-19: 4 mg via INTRAMUSCULAR

## 2015-07-19 MED ORDER — IOPAMIDOL (ISOVUE-M 200) INJECTION 41%
15.0000 mL | Freq: Once | INTRAMUSCULAR | Status: AC
  Administered 2015-07-19: 15 mL via INTRATHECAL

## 2015-07-19 MED ORDER — MEPERIDINE HCL 100 MG/ML IJ SOLN
100.0000 mg | Freq: Once | INTRAMUSCULAR | Status: AC
  Administered 2015-07-19: 100 mg via INTRAMUSCULAR

## 2015-07-19 NOTE — Discharge Instructions (Signed)
Myelogram Discharge Instructions  1. Go home and rest quietly for the next 24 hours.  It is important to lie flat for the next 24 hours.  Get up only to go to the restroom.  You may lie in the bed or on a couch on your back, your stomach, your left side or your right side.  You may have one pillow under your head.  You may have pillows between your knees while you are on your side or under your knees while you are on your back.  2. DO NOT drive today.  Recline the seat as far back as it will go, while still wearing your seat belt, on the way home.  3. You may get up to go to the bathroom as needed.  You may sit up for 10 minutes to eat.  You may resume your normal diet and medications unless otherwise indicated.  Drink lots of extra fluids today and tomorrow.  4. The incidence of headache, nausea, or vomiting is about 5% (one in 20 patients).  If you develop a headache, lie flat and drink plenty of fluids until the headache goes away.  Caffeinated beverages may be helpful.  If you develop severe nausea and vomiting or a headache that does not go away with flat bed rest, call [phone].  5. You may resume normal activities after your 24 hours of bed rest is over; however, do not exert yourself strongly or do any heavy lifting tomorrow. If when you get up you have a headache when standing, go back to bed and force fluids for another 24 hours.  6. Call your physician for a follow-up appointment.  The results of your myelogram will be sent directly to your physician by the following day.  7. If you have any questions or if complications develop after you arrive home, please call [phone].  Discharge instructions have been explained to the patient.  The patient, or the person responsible for the patient, fully understands these instructions.       May resume Zoloft on July 20, 2015, after 1:00 pm.

## 2015-07-21 ENCOUNTER — Ambulatory Visit
Admission: RE | Admit: 2015-07-21 | Discharge: 2015-07-21 | Disposition: A | Discharge status: Home / Self Care | Payer: Medicare Other | Source: Ambulatory Visit | Attending: Specialist | Admitting: Specialist

## 2015-07-21 ENCOUNTER — Ambulatory Visit (INDEPENDENT_AMBULATORY_CARE_PROVIDER_SITE_OTHER): Payer: Medicare Other | Admitting: *Deleted

## 2015-07-21 DIAGNOSIS — J309 Allergic rhinitis, unspecified: Secondary | ICD-10-CM

## 2015-07-23 ENCOUNTER — Ambulatory Visit (INDEPENDENT_AMBULATORY_CARE_PROVIDER_SITE_OTHER): Payer: Medicare Other | Admitting: *Deleted

## 2015-07-23 DIAGNOSIS — J309 Allergic rhinitis, unspecified: Secondary | ICD-10-CM

## 2015-07-26 ENCOUNTER — Ambulatory Visit (INDEPENDENT_AMBULATORY_CARE_PROVIDER_SITE_OTHER): Payer: Medicare Other

## 2015-07-26 DIAGNOSIS — J309 Allergic rhinitis, unspecified: Secondary | ICD-10-CM | POA: Diagnosis not present

## 2015-07-28 ENCOUNTER — Encounter: Payer: Self-pay | Admitting: Podiatry

## 2015-07-28 ENCOUNTER — Ambulatory Visit (INDEPENDENT_AMBULATORY_CARE_PROVIDER_SITE_OTHER): Payer: Medicare Other | Admitting: Podiatry

## 2015-07-28 DIAGNOSIS — B351 Tinea unguium: Secondary | ICD-10-CM | POA: Diagnosis not present

## 2015-07-28 DIAGNOSIS — M79676 Pain in unspecified toe(s): Secondary | ICD-10-CM | POA: Diagnosis not present

## 2015-07-28 NOTE — Patient Instructions (Signed)
Diabetes and Foot Care Diabetes may cause you to have problems because of poor blood supply (circulation) to your feet and legs. This may cause the skin on your feet to become thinner, break easier, and heal more slowly. Your skin may become dry, and the skin may peel and crack. You may also have nerve damage in your legs and feet causing decreased feeling in them. You may not notice minor injuries to your feet that could lead to infections or more serious problems. Taking care of your feet is one of the most important things you can do for yourself.  HOME CARE INSTRUCTIONS  Wear shoes at all times, even in the house. Do not go barefoot. Bare feet are easily injured.  Check your feet daily for blisters, cuts, and redness. If you cannot see the bottom of your feet, use a mirror or ask someone for help.  Wash your feet with warm water (do not use hot water) and mild soap. Then pat your feet and the areas between your toes until they are completely dry. Do not soak your feet as this can dry your skin.  Apply a moisturizing lotion or petroleum jelly (that does not contain alcohol and is unscented) to the skin on your feet and to dry, brittle toenails. Do not apply lotion between your toes.  Trim your toenails straight across. Do not dig under them or around the cuticle. File the edges of your nails with an emery board or nail file.  Do not cut corns or calluses or try to remove them with medicine.  Wear clean socks or stockings every day. Make sure they are not too tight. Do not wear knee-high stockings since they may decrease blood flow to your legs.  Wear shoes that fit properly and have enough cushioning. To break in new shoes, wear them for just a few hours a day. This prevents you from injuring your feet. Always look in your shoes before you put them on to be sure there are no objects inside.  Do not cross your legs. This may decrease the blood flow to your feet.  If you find a minor scrape,  cut, or break in the skin on your feet, keep it and the skin around it clean and dry. These areas may be cleansed with mild soap and water. Do not cleanse the area with peroxide, alcohol, or iodine.  When you remove an adhesive bandage, be sure not to damage the skin around it.  If you have a wound, look at it several times a day to make sure it is healing.  Do not use heating pads or hot water bottles. They may burn your skin. If you have lost feeling in your feet or legs, you may not know it is happening until it is too late.  Make sure your health care provider performs a complete foot exam at least annually or more often if you have foot problems. Report any cuts, sores, or bruises to your health care provider immediately. SEEK MEDICAL CARE IF:   You have an injury that is not healing.  You have cuts or breaks in the skin.  You have an ingrown nail.  You notice redness on your legs or feet.  You feel burning or tingling in your legs or feet.  You have pain or cramps in your legs and feet.  Your legs or feet are numb.  Your feet always feel cold. SEEK IMMEDIATE MEDICAL CARE IF:   There is increasing redness,  swelling, or pain in or around a wound.  There is a red line that goes up your leg.  Pus is coming from a wound.  You develop a fever or as directed by your health care provider.  You notice a bad smell coming from an ulcer or wound.   This information is not intended to replace advice given to you by your health care provider. Make sure you discuss any questions you have with your health care provider.   Document Released: 01/28/2000 Document Revised: 10/02/2012 Document Reviewed: 07/09/2012 Elsevier Interactive Patient Education [employer].

## 2015-07-28 NOTE — Progress Notes (Signed)
Patient ID: Phillip Rocha, male   DOB: [birth year], 55 y.o.   MRN: [id number]  Subjective: This patient presents again for scheduled plain that his toenails are uncomfortable when wearing shoes and when he transfers from wheelchair and is requesting toenail debridement Patient is diabetic and recovering from recent back surgery  Objective: Patient is transfers from wheelchair to treatment chair Orientated 3 No skin lesions bilaterally The toenails are hypertrophic, elongated, deformed, discolored and tender direct palpation 6-10  Assessment: Symptomatic onychomycoses 6-10 Type II diabetic  Plan: Debridement toenails 6-10 mechanically and electrically without any bleeding  Reappoint 3 months

## 2015-07-29 ENCOUNTER — Ambulatory Visit (INDEPENDENT_AMBULATORY_CARE_PROVIDER_SITE_OTHER): Payer: Medicare Other

## 2015-07-29 DIAGNOSIS — J309 Allergic rhinitis, unspecified: Secondary | ICD-10-CM

## 2015-08-03 ENCOUNTER — Ambulatory Visit (INDEPENDENT_AMBULATORY_CARE_PROVIDER_SITE_OTHER): Payer: Medicare Other

## 2015-08-03 DIAGNOSIS — J309 Allergic rhinitis, unspecified: Secondary | ICD-10-CM | POA: Diagnosis not present

## 2015-08-05 ENCOUNTER — Ambulatory Visit (INDEPENDENT_AMBULATORY_CARE_PROVIDER_SITE_OTHER): Payer: Medicare Other | Admitting: *Deleted

## 2015-08-05 DIAGNOSIS — J309 Allergic rhinitis, unspecified: Secondary | ICD-10-CM

## 2015-08-10 ENCOUNTER — Ambulatory Visit (INDEPENDENT_AMBULATORY_CARE_PROVIDER_SITE_OTHER): Payer: Medicare Other

## 2015-08-10 DIAGNOSIS — J309 Allergic rhinitis, unspecified: Secondary | ICD-10-CM | POA: Diagnosis not present

## 2015-08-12 ENCOUNTER — Encounter (INDEPENDENT_AMBULATORY_CARE_PROVIDER_SITE_OTHER): Payer: Self-pay | Admitting: Diagnostic Neuroimaging

## 2015-08-12 ENCOUNTER — Ambulatory Visit (INDEPENDENT_AMBULATORY_CARE_PROVIDER_SITE_OTHER): Payer: Medicare Other | Admitting: Diagnostic Neuroimaging

## 2015-08-12 DIAGNOSIS — R29898 Other symptoms and signs involving the musculoskeletal system: Secondary | ICD-10-CM | POA: Diagnosis not present

## 2015-08-12 DIAGNOSIS — Z0289 Encounter for other administrative examinations: Secondary | ICD-10-CM

## 2015-08-12 NOTE — Procedures (Signed)
[facility]  NCS (NERVE CONDUCTION STUDY) WITH EMG (ELECTROMYOGRAPHY) REPORT   STUDY DATE: 08/12/15 PATIENT NAME: Phillip Rocha DOB: February 18, 1960 MRN: [id number]  ORDERING CLINICIAN: Antionette Fairy  TECHNOLOGIST: Laretta Alstrom  ELECTROMYOGRAPHER: Earlean Polka. Penumalli, MD  CLINICAL INFORMATION: 55 year old male with lower extremity weakness. Patient has history of diabetes. Patient has history of lumbar spine surgery in Oct 2016. CT myelogram lumbar spine on 07/19/15 shows severe stenosis at L2-3, moderate-severe stenosis at L1-2, and bilateral L2 and L3 nerve root impingement.    FINDINGS: NERVE CONDUCTION STUDY: Technically difficult study due to patient body habitus.  Bilateral peroneal and right tibial motor responses could not be obtained even with increased stimulation. Left tibial motor response has prolonged distal latency, decreased amplitude and normal conduction velocity.  Bilateral peroneal sensory responses could not be obtained.   NEEDLE ELECTROMYOGRAPHY: Needle examination was deferred due to history of lumbar spine surgery, bilateral lower extremity peripheral edema, decreased patient mobility.   IMPRESSION:  Abnormal study demonstrating: 1. Severe axonal sensorimotor polyneuropathy. In addition, this was a technically difficult study due to patient body habitus. 2. 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     INTERPRETING PHYSICIAN:  Penni Bombard, MD Certified in Neurology, Neurophysiology and Neuroimaging  [facility] [address] [place], [place] [address] [phone]

## 2015-08-13 ENCOUNTER — Ambulatory Visit (INDEPENDENT_AMBULATORY_CARE_PROVIDER_SITE_OTHER): Payer: Medicare Other | Admitting: *Deleted

## 2015-08-13 DIAGNOSIS — J309 Allergic rhinitis, unspecified: Secondary | ICD-10-CM

## 2015-08-18 ENCOUNTER — Ambulatory Visit (INDEPENDENT_AMBULATORY_CARE_PROVIDER_SITE_OTHER): Payer: Medicare Other

## 2015-08-18 DIAGNOSIS — J309 Allergic rhinitis, unspecified: Secondary | ICD-10-CM

## 2015-08-24 ENCOUNTER — Ambulatory Visit (INDEPENDENT_AMBULATORY_CARE_PROVIDER_SITE_OTHER): Payer: Medicare Other | Admitting: *Deleted

## 2015-08-24 DIAGNOSIS — J309 Allergic rhinitis, unspecified: Secondary | ICD-10-CM

## 2015-08-27 ENCOUNTER — Ambulatory Visit (INDEPENDENT_AMBULATORY_CARE_PROVIDER_SITE_OTHER): Payer: Medicare Other | Admitting: *Deleted

## 2015-08-27 DIAGNOSIS — J309 Allergic rhinitis, unspecified: Secondary | ICD-10-CM | POA: Diagnosis not present

## 2015-08-31 ENCOUNTER — Encounter (HOSPITAL_COMMUNITY)
Admission: RE | Admit: 2015-08-31 | Discharge: 2015-08-31 | Disposition: A | Discharge status: Home / Self Care | Payer: Medicare Other | Source: Ambulatory Visit | Attending: Specialist | Admitting: Specialist

## 2015-08-31 ENCOUNTER — Ambulatory Visit (HOSPITAL_COMMUNITY)
Admission: RE | Admit: 2015-08-31 | Discharge: 2015-08-31 | Disposition: A | Discharge status: Home / Self Care | Payer: Medicare Other | Source: Ambulatory Visit | Attending: Surgery | Admitting: Surgery

## 2015-08-31 ENCOUNTER — Other Ambulatory Visit (HOSPITAL_COMMUNITY): Payer: Self-pay | Admitting: *Deleted

## 2015-08-31 ENCOUNTER — Encounter (HOSPITAL_COMMUNITY): Payer: Self-pay

## 2015-08-31 DIAGNOSIS — Z01818 Encounter for other preprocedural examination: Secondary | ICD-10-CM

## 2015-08-31 LAB — COMPREHENSIVE METABOLIC PANEL
ALK PHOS: 99 U/L (ref 38–126)
ALT: 17 U/L (ref 17–63)
AST: 19 U/L (ref 15–41)
Albumin: 4 g/dL (ref 3.5–5.0)
Anion gap: 9 (ref 5–15)
BILIRUBIN TOTAL: 0.6 mg/dL (ref 0.3–1.2)
BUN: 10 mg/dL (ref 6–20)
CO2: 27 mmol/L (ref 22–32)
CREATININE: 0.95 mg/dL (ref 0.61–1.24)
Calcium: 9.4 mg/dL (ref 8.9–10.3)
Chloride: 104 mmol/L (ref 101–111)
GFR calc Af Amer: >60 mL/min (ref >60)
Glucose, Bld: 149 mg/dL — ABNORMAL HIGH (ref 65–99)
Potassium: 3.8 mmol/L (ref 3.5–5.1)
Sodium: 140 mmol/L (ref 135–145)
TOTAL PROTEIN: 7.3 g/dL (ref 6.5–8.1)

## 2015-08-31 LAB — PROTIME-INR
INR: 0.99 (ref 0.00–1.49)
PROTHROMBIN TIME: 13.3 s (ref 11.6–15.2)

## 2015-08-31 LAB — URINALYSIS, ROUTINE W REFLEX MICROSCOPIC
BILIRUBIN URINE: NEGATIVE
Glucose, UA: NEGATIVE mg/dL
HGB URINE DIPSTICK: NEGATIVE
KETONES UR: NEGATIVE mg/dL
Leukocytes, UA: NEGATIVE
NITRITE: NEGATIVE
PROTEIN: NEGATIVE mg/dL
Specific Gravity, Urine: 1.014 (ref 1.005–1.030)
pH: 5 (ref 5.0–8.0)

## 2015-08-31 LAB — CBC
HCT: 41.5 % (ref 39.0–52.0)
Hemoglobin: 12.8 g/dL — ABNORMAL LOW (ref 13.0–17.0)
MCH: 23.3 pg — AB (ref 26.0–34.0)
MCHC: 30.8 g/dL (ref 30.0–36.0)
MCV: 75.5 fL — ABNORMAL LOW (ref 78.0–100.0)
PLATELETS: 229 10*3/uL (ref 150–400)
RBC: 5.5 MIL/uL (ref 4.22–5.81)
RDW: 19.5 % — ABNORMAL HIGH (ref 11.5–15.5)
WBC: 11.8 10*3/uL — AB (ref 4.0–10.5)

## 2015-08-31 LAB — APTT: APTT: 30 s (ref 24–37)

## 2015-08-31 LAB — GLUCOSE, CAPILLARY: Glucose-Capillary: 185 mg/dL — ABNORMAL HIGH (ref 65–99)

## 2015-08-31 LAB — SURGICAL PCR SCREEN
MRSA, PCR: NEGATIVE
STAPHYLOCOCCUS AUREUS: NEGATIVE

## 2015-08-31 NOTE — Progress Notes (Signed)
PCP - [place] [facility] Cardiologist - denies  Chest x-ray - 08/31/15 EKG - 08/31/15  Stress Test - denies ECHO - 11/20/14 Cardiac Cath - denies    Patient denies shortness of breath, fever, cough and chest pain at PAT appointment  EKG showed infarct so repeated EKG at PAT appointment   Will send to anesthesia for review   STOP Bang tool sent to PCP for positive sleep apnea score - patient stated that he would not go for further testing for sleep apnea but understood that his primary care physician would be notified.

## 2015-08-31 NOTE — Pre-Procedure Instructions (Signed)
ASIEL CHROSTOWSKI  08/31/2015      [facility] [place], [place] - [address] [place] [address] Phone: [phone] Fax: [place], [place] - [address] [place] [address] Phone: [phone] Fax: [phone]    Your procedure is scheduled on Wednesday July 21  Report to [facility] at [phone] A.M.  Call this number if you have problems the morning of surgery:  [phone]   Remember:  Do not eat food or drink liquids after midnight.   Take these medicines the morning of surgery with A SIP OF WATER allopurinol, amldipine (norvasc), nasal spray, gabapentin (neurontin), hydroxyzine, xyzal, ativan if needed, singular, morphine if needed, prilosec, zoloft,   7 days prior to surgery STOP taking any Aspirin, Aleve, Naproxen, Ibuprofen, Motrin, Advil, Goody's, BC's, all herbal medications, fish oil, and all vitamins  WHAT DO I DO ABOUT MY DIABETES MEDICATION?   Marland Kitchen Do not take oral diabetes medicines (pills) the morning of surgery. DO NOT TAKE METFORMIN    How to Manage Your Diabetes Before and After Surgery  Why is it important to control my blood sugar before and after surgery? . Improving blood sugar levels before and after surgery helps healing and can limit problems. . A way of improving blood sugar control is eating a healthy diet by: o  Eating less sugar and carbohydrates o  Increasing activity/exercise o  Talking with your doctor about reaching your blood sugar goals . High blood sugars (greater than 180 mg/dL) can raise your risk of infections and slow your recovery, so you will need to focus on controlling your diabetes during the weeks before surgery. . Make sure that the doctor who takes care of your diabetes knows about your planned surgery including the date and location.  How do I manage my blood sugar before  surgery? . Check your blood sugar at least 4 times a day, starting 2 days before surgery, to make sure that the level is not too high or low. o Check your blood sugar the morning of your surgery when you wake up and every 2 hours until you get to the Short Stay unit. . If your blood sugar is less than 70 mg/dL, you will need to treat for low blood sugar: o Do not take insulin. o Treat a low blood sugar (less than 70 mg/dL) with  cup of clear juice (cranberry or apple), 4 glucose tablets, OR glucose gel. o Recheck blood sugar in 15 minutes after treatment (to make sure it is greater than 70 mg/dL). If your blood sugar is not greater than 70 mg/dL on recheck, call [phone] for further instructions. . Report your blood sugar to the short stay nurse when you get to Short Stay.  . If you are admitted to the hospital after surgery: o Your blood sugar will be checked by the staff and you will probably be given insulin after surgery (instead of oral diabetes medicines) to make sure you have good blood sugar levels. o The goal for blood sugar control after surgery is 80-180 mg/dL.     Do not wear jewelry..  Do not wear lotions, powders, or cologne.  You may NOT wear deoderant.   Men may shave face and neck.  Do not bring valuables to the hospital.  [facility] is not responsible for any belongings or valuables.  Contacts, dentures or bridgework  may not be worn into surgery.  Leave your suitcase in the car.  After surgery it may be brought to your room.  For patients admitted to the hospital, discharge time will be determined by your treatment team.  Patients discharged the day of surgery will not be allowed to drive home.    Special instructions:   [place]- Preparing For Surgery  Before surgery, you can play an important role. Because skin is not sterile, your skin needs to be as free of germs as possible. You can reduce the number of germs on your skin by washing with CHG  (chlorahexidine gluconate) Soap before surgery.  CHG is an antiseptic cleaner which kills germs and bonds with the skin to continue killing germs even after washing.  Please do not use if you have an allergy to CHG or antibacterial soaps. If your skin becomes reddened/irritated stop using the CHG.  Do not shave (including legs and underarms) for at least 48 hours prior to first CHG shower. It is OK to shave your face.  Please follow these instructions carefully.   1. Shower the NIGHT BEFORE SURGERY and the MORNING OF SURGERY with CHG.   2. If you chose to wash your hair, wash your hair first as usual with your normal shampoo.  3. After you shampoo, rinse your hair and body thoroughly to remove the shampoo.  4. Use CHG as you would any other liquid soap. You can apply CHG directly to the skin and wash gently with a scrungie or a clean washcloth.   5. Apply the CHG Soap to your body ONLY FROM THE NECK DOWN.  Do not use on open wounds or open sores. Avoid contact with your eyes, ears, mouth and genitals (private parts). Wash genitals (private parts) with your normal soap.  6. Wash thoroughly, paying special attention to the area where your surgery will be performed.  7. Thoroughly rinse your body with warm water from the neck down.  8. DO NOT shower/wash with your normal soap after using and rinsing off the CHG Soap.  9. Pat yourself dry with a CLEAN TOWEL.   10. Wear CLEAN PAJAMAS   11. Place CLEAN SHEETS on your bed the night of your first shower and DO NOT SLEEP WITH PETS.    Day of Surgery: Do not apply any deodorants/lotions. Please wear clean clothes to the hospital/surgery center.      Please read over the following fact sheets that you were given. Pain Booklet, Coughing and Deep Breathing, MRSA Information and Surgical Site Infection Prevention

## 2015-09-01 LAB — HEMOGLOBIN A1C
HEMOGLOBIN A1C: 7.1 % — AB (ref 4.8–5.6)
MEAN PLASMA GLUCOSE: 157 mg/dL

## 2015-09-01 NOTE — Progress Notes (Signed)
Anesthesia Chart Review:  Pt is a 55 year old male scheduled for L1-2, L2-3 partial hemilaminectomies on 09/03/2015 with Basil Dess, MD.   PCP is Dr. Velna Hatchet at [facility]).   History includes DM2, HTN, GERD, opioid dependence, cholecystectomy, anemia. Never smoker. BMI 52.   - S/p redo lumbar interbody fusion 76/2/83 (complicated by significant blood loss; acute respiratory failure with hypoxia (was maintained on ventilator for 5 days), acute renal failure).  - S/p L TKA 08/21/13.  - S/p R TKA 10/01/12.  - S/p L3-S1 fusion 1/51/76 (complicated by acute kidney injury, anemia, and post-operative ileus with associated acute respiratory failure with hypoxia (felt related to mechanical obstruction from abdominal distention, pain medication, body habitus, possibly undiagnosed OSA)).   Note OSA screening score positive as it has been in the past; per PAT notes, pt refuses further testing; has needed Bi-Pap post-operatively in the past.   Medications include: amlodipine, iron, lasix, losartan, metformin, methotrexate, MS contin, prilosec  Preoperative labs reviewed. HgbA1c 7.1, glucose 149.   Chest x-ray 08/31/15 reviewed. Stable bibasilar fibrosis. No active lung disease.  EKG 08/31/15: NSR. Possible Inferior infarct, age undetermined. Appears stable dating back to 09/23/12.   Echo 11/20/14:  - Left ventricle: Poor acoustic windows With echocontrast LVEF appears grossly normal. Cannot fully evaluate regional wall motion as not all segments are visualized. The cavity size was normal. Wall thickness was normal. - Right ventricle: RV is not seen well enough to evaluate function.  Echo 04/13/14:  - Left ventricle: The cavity size was normal. Wall thickness was normal. Systolic function was normal. The estimated ejection fraction was in the range of 60% to 65%. Wall motion was normal; there were no regional wall motion abnormalities. Dopplerparameters are consistent with  abnormal left ventricular relaxation (grade 1 diastolic dysfunction).  Patient had a cardiology evaluation by Dr. Daneen Schick [facility]) on 09/25/12 for abnormal EKG. A nuclear stress test was ordered and done on 09/26/12. Results showed inferior defect consistent with diaphragmatic attenuation, small fixed mild anterior defect likely soft tissue attenuation, no ischemia, post stress EF 50%. Low risk scan. (Scanned under Media tab, Correspondence 10/01/12.)   If no changes, I anticipate pt can proceed with surgery as scheduled.   Willeen Cass, FNP-BC [facility] Short Stay Surgical Center/Anesthesiology Phone: [phone] 09/01/2015 1:38 PM

## 2015-09-02 MED ORDER — DEXTROSE 5 % IV SOLN
3.0000 g | INTRAVENOUS | Status: AC
  Administered 2015-09-03: 3 g via INTRAVENOUS
  Filled 2015-09-02: qty 3000

## 2015-09-03 ENCOUNTER — Encounter (HOSPITAL_COMMUNITY): Payer: Self-pay | Admitting: *Deleted

## 2015-09-03 ENCOUNTER — Inpatient Hospital Stay (HOSPITAL_COMMUNITY)
Admission: RE | Admit: 2015-09-03 | Discharge: 2015-09-06 | DRG: 516 | Disposition: A | Discharge status: Skilled Nursing Facility | Payer: Medicare Other | Source: Ambulatory Visit | Attending: Specialist | Admitting: Specialist

## 2015-09-03 ENCOUNTER — Inpatient Hospital Stay (HOSPITAL_COMMUNITY): Payer: Medicare Other

## 2015-09-03 ENCOUNTER — Encounter (HOSPITAL_COMMUNITY)
Admission: RE | Disposition: A | Discharge status: Skilled Nursing Facility | Payer: Self-pay | Source: Ambulatory Visit | Attending: Specialist

## 2015-09-03 ENCOUNTER — Inpatient Hospital Stay (HOSPITAL_COMMUNITY): Payer: Medicare Other | Admitting: Emergency Medicine

## 2015-09-03 ENCOUNTER — Inpatient Hospital Stay (HOSPITAL_COMMUNITY): Payer: Medicare Other | Admitting: Anesthesiology

## 2015-09-03 DIAGNOSIS — Z96653 Presence of artificial knee joint, bilateral: Secondary | ICD-10-CM | POA: Diagnosis present

## 2015-09-03 DIAGNOSIS — Z6841 Body Mass Index (BMI) 40.0 and over, adult: Secondary | ICD-10-CM | POA: Diagnosis not present

## 2015-09-03 DIAGNOSIS — G8929 Other chronic pain: Secondary | ICD-10-CM | POA: Diagnosis present

## 2015-09-03 DIAGNOSIS — D649 Anemia, unspecified: Secondary | ICD-10-CM | POA: Diagnosis present

## 2015-09-03 DIAGNOSIS — Z419 Encounter for procedure for purposes other than remedying health state, unspecified: Secondary | ICD-10-CM

## 2015-09-03 DIAGNOSIS — E119 Type 2 diabetes mellitus without complications: Secondary | ICD-10-CM | POA: Diagnosis present

## 2015-09-03 DIAGNOSIS — K219 Gastro-esophageal reflux disease without esophagitis: Secondary | ICD-10-CM | POA: Diagnosis present

## 2015-09-03 DIAGNOSIS — Z7984 Long term (current) use of oral hypoglycemic drugs: Secondary | ICD-10-CM | POA: Diagnosis not present

## 2015-09-03 DIAGNOSIS — L4050 Arthropathic psoriasis, unspecified: Secondary | ICD-10-CM | POA: Diagnosis present

## 2015-09-03 DIAGNOSIS — F1120 Opioid dependence, uncomplicated: Secondary | ICD-10-CM | POA: Diagnosis present

## 2015-09-03 DIAGNOSIS — M4806 Spinal stenosis, lumbar region: Principal | ICD-10-CM | POA: Diagnosis present

## 2015-09-03 DIAGNOSIS — I10 Essential (primary) hypertension: Secondary | ICD-10-CM | POA: Diagnosis present

## 2015-09-03 DIAGNOSIS — Z8249 Family history of ischemic heart disease and other diseases of the circulatory system: Secondary | ICD-10-CM | POA: Diagnosis not present

## 2015-09-03 DIAGNOSIS — G4733 Obstructive sleep apnea (adult) (pediatric): Secondary | ICD-10-CM | POA: Diagnosis present

## 2015-09-03 DIAGNOSIS — F419 Anxiety disorder, unspecified: Secondary | ICD-10-CM | POA: Diagnosis present

## 2015-09-03 DIAGNOSIS — M48062 Spinal stenosis, lumbar region with neurogenic claudication: Secondary | ICD-10-CM | POA: Diagnosis present

## 2015-09-03 HISTORY — PX: LUMBAR LAMINECTOMY: SHX95

## 2015-09-03 LAB — GLUCOSE, CAPILLARY
GLUCOSE-CAPILLARY: 122 mg/dL — AB (ref 65–99)
GLUCOSE-CAPILLARY: 115 mg/dL — AB (ref 65–99)

## 2015-09-03 SURGERY — MICRODISCECTOMY LUMBAR LAMINECTOMY
Anesthesia: General

## 2015-09-03 MED ORDER — BUPIVACAINE HCL 0.5 % IJ SOLN
INTRAMUSCULAR | Status: DC | PRN
  Administered 2015-09-03: 7 mL

## 2015-09-03 MED ORDER — TOPIRAMATE 25 MG PO TABS
50.0000 mg | ORAL_TABLET | Freq: Two times a day (BID) | ORAL | Status: DC
  Administered 2015-09-03 – 2015-09-06 (×6): 50 mg via ORAL
  Filled 2015-09-03 (×7): qty 2

## 2015-09-03 MED ORDER — CEFAZOLIN SODIUM-DEXTROSE 2-4 GM/100ML-% IV SOLN
2.0000 g | Freq: Three times a day (TID) | INTRAVENOUS | Status: AC
  Administered 2015-09-03 – 2015-09-04 (×2): 2 g via INTRAVENOUS
  Filled 2015-09-03 (×2): qty 100

## 2015-09-03 MED ORDER — 0.9 % SODIUM CHLORIDE (POUR BTL) OPTIME
TOPICAL | Status: DC | PRN
  Administered 2015-09-03: 1000 mL

## 2015-09-03 MED ORDER — BUPIVACAINE LIPOSOME 1.3 % IJ SUSP
INTRAMUSCULAR | Status: DC | PRN
  Administered 2015-09-03: 7 mL

## 2015-09-03 MED ORDER — TIZANIDINE HCL 4 MG PO TABS
4.0000 mg | ORAL_TABLET | Freq: Three times a day (TID) | ORAL | Status: DC
  Administered 2015-09-03 – 2015-09-04 (×3): 4 mg via ORAL
  Filled 2015-09-03 (×3): qty 1

## 2015-09-03 MED ORDER — TRAMADOL HCL 50 MG PO TABS: 50.0000 mg | ORAL_TABLET | Freq: Four times a day (QID) | ORAL | Status: AC | PRN

## 2015-09-03 MED ORDER — SUCCINYLCHOLINE CHLORIDE 200 MG/10ML IV SOSY
PREFILLED_SYRINGE | INTRAVENOUS | Status: AC
  Filled 2015-09-03: qty 20

## 2015-09-03 MED ORDER — GELATIN ABSORBABLE 100 EX MISC
CUTANEOUS | Status: DC | PRN
  Administered 2015-09-03: 20 mL via TOPICAL

## 2015-09-03 MED ORDER — LIDOCAINE 2% (20 MG/ML) 5 ML SYRINGE
INTRAMUSCULAR | Status: AC
  Filled 2015-09-03: qty 5

## 2015-09-03 MED ORDER — ONDANSETRON HCL 4 MG/2ML IJ SOLN: 4.0000 mg | Freq: Once | INTRAMUSCULAR | Status: DC | PRN

## 2015-09-03 MED ORDER — TEMAZEPAM 15 MG PO CAPS
15.0000 mg | ORAL_CAPSULE | Freq: Every day | ORAL | Status: DC
  Administered 2015-09-03 – 2015-09-05 (×3): 15 mg via ORAL
  Filled 2015-09-03 (×3): qty 1

## 2015-09-03 MED ORDER — SUGAMMADEX SODIUM 500 MG/5ML IV SOLN
INTRAVENOUS | Status: DC | PRN
  Administered 2015-09-03: 335.6 mg via INTRAVENOUS

## 2015-09-03 MED ORDER — BUPIVACAINE HCL (PF) 0.5 % IJ SOLN
INTRAMUSCULAR | Status: AC
  Filled 2015-09-03: qty 30

## 2015-09-03 MED ORDER — KETOROLAC TROMETHAMINE 30 MG/ML IJ SOLN
INTRAMUSCULAR | Status: AC
  Administered 2015-09-03: 30 mg via INTRAVENOUS
  Filled 2015-09-03: qty 1

## 2015-09-03 MED ORDER — MORPHINE SULFATE ER 30 MG PO TBCR
30.0000 mg | EXTENDED_RELEASE_TABLET | Freq: Two times a day (BID) | ORAL | Status: DC
  Administered 2015-09-03 – 2015-09-04 (×2): 30 mg via ORAL
  Filled 2015-09-03 (×2): qty 1

## 2015-09-03 MED ORDER — PANTOPRAZOLE SODIUM 40 MG PO TBEC
40.0000 mg | DELAYED_RELEASE_TABLET | Freq: Every day | ORAL | Status: DC
  Administered 2015-09-04 – 2015-09-06 (×3): 40 mg via ORAL
  Filled 2015-09-03 (×4): qty 1

## 2015-09-03 MED ORDER — OXYCODONE HCL 5 MG PO TABS
20.0000 mg | ORAL_TABLET | Freq: Four times a day (QID) | ORAL | Status: DC
  Administered 2015-09-03 – 2015-09-04 (×4): 20 mg via ORAL
  Filled 2015-09-03 (×4): qty 4

## 2015-09-03 MED ORDER — POLYETHYLENE GLYCOL 3350 17 G PO PACK
17.0000 g | PACK | Freq: Every day | ORAL | Status: DC | PRN
  Administered 2015-09-04 – 2015-09-05 (×2): 17 g via ORAL
  Filled 2015-09-03 (×2): qty 1

## 2015-09-03 MED ORDER — PHENOL 1.4 % MT LIQD: 1.0000 | OROMUCOSAL | Status: DC | PRN

## 2015-09-03 MED ORDER — INSULIN ASPART 100 UNIT/ML ~~LOC~~ SOLN
0.0000 [IU] | SUBCUTANEOUS | Status: DC
  Administered 2015-09-04 (×4): 4 [IU] via SUBCUTANEOUS
  Administered 2015-09-04: 7 [IU] via SUBCUTANEOUS
  Administered 2015-09-05: 4 [IU] via SUBCUTANEOUS
  Administered 2015-09-05: 7 [IU] via SUBCUTANEOUS
  Administered 2015-09-05 (×3): 3 [IU] via SUBCUTANEOUS
  Administered 2015-09-05 – 2015-09-06 (×4): 4 [IU] via SUBCUTANEOUS

## 2015-09-03 MED ORDER — GABAPENTIN 600 MG PO TABS
1200.0000 mg | ORAL_TABLET | Freq: Four times a day (QID) | ORAL | Status: DC
  Administered 2015-09-03 – 2015-09-06 (×10): 1200 mg via ORAL
  Filled 2015-09-03 (×11): qty 2

## 2015-09-03 MED ORDER — AZELASTINE-FLUTICASONE 137-50 MCG/ACT NA SUSP: 2.0000 | Freq: Two times a day (BID) | NASAL | Status: DC

## 2015-09-03 MED ORDER — FENTANYL CITRATE (PF) 100 MCG/2ML IJ SOLN
INTRAMUSCULAR | Status: DC | PRN
Start: 2015-09-03 — End: 2015-09-03
  Administered 2015-09-03 (×2): 50 ug via INTRAVENOUS
  Administered 2015-09-03: 150 ug via INTRAVENOUS
  Administered 2015-09-03 (×5): 50 ug via INTRAVENOUS

## 2015-09-03 MED ORDER — SODIUM CHLORIDE 0.9% FLUSH: 3.0000 mL | INTRAVENOUS | Status: DC | PRN

## 2015-09-03 MED ORDER — KETOROLAC TROMETHAMINE 30 MG/ML IJ SOLN
30.0000 mg | Freq: Once | INTRAMUSCULAR | Status: AC
  Administered 2015-09-03: 30 mg via INTRAVENOUS

## 2015-09-03 MED ORDER — DEXAMETHASONE SODIUM PHOSPHATE 10 MG/ML IJ SOLN
INTRAMUSCULAR | Status: AC
  Filled 2015-09-03: qty 1

## 2015-09-03 MED ORDER — ALUM & MAG HYDROXIDE-SIMETH 200-200-20 MG/5ML PO SUSP: 30.0000 mL | Freq: Four times a day (QID) | ORAL | Status: DC | PRN

## 2015-09-03 MED ORDER — OXYCODONE-ACETAMINOPHEN 5-325 MG PO TABS
1.0000 | ORAL_TABLET | ORAL | Status: DC | PRN
  Administered 2015-09-04: 2 via ORAL
  Filled 2015-09-03: qty 2

## 2015-09-03 MED ORDER — POTASSIUM CHLORIDE IN NACL 40-0.9 MEQ/L-% IV SOLN
INTRAVENOUS | Status: DC
  Administered 2015-09-03 – 2015-09-04 (×2): 100 mL/h via INTRAVENOUS
  Filled 2015-09-03 (×8): qty 1000

## 2015-09-03 MED ORDER — MIDAZOLAM HCL 5 MG/5ML IJ SOLN
INTRAMUSCULAR | Status: DC | PRN
  Administered 2015-09-03: 2 mg via INTRAVENOUS

## 2015-09-03 MED ORDER — BISACODYL 5 MG PO TBEC
5.0000 mg | DELAYED_RELEASE_TABLET | Freq: Every day | ORAL | Status: DC | PRN
  Administered 2015-09-05: 5 mg via ORAL
  Filled 2015-09-03: qty 1

## 2015-09-03 MED ORDER — METHOCARBAMOL 500 MG PO TABS
500.0000 mg | ORAL_TABLET | Freq: Four times a day (QID) | ORAL | Status: DC | PRN
  Administered 2015-09-05: 500 mg via ORAL
  Filled 2015-09-03: qty 1

## 2015-09-03 MED ORDER — FENTANYL CITRATE (PF) 250 MCG/5ML IJ SOLN
INTRAMUSCULAR | Status: AC
  Filled 2015-09-03: qty 5

## 2015-09-03 MED ORDER — FLUTICASONE PROPIONATE 50 MCG/ACT NA SUSP
2.0000 | Freq: Two times a day (BID) | NASAL | Status: DC
  Administered 2015-09-06: 2 via NASAL
  Filled 2015-09-03: qty 16

## 2015-09-03 MED ORDER — LACTATED RINGERS IV SOLN
INTRAVENOUS | Status: DC
  Administered 2015-09-03: 11:00:00 via INTRAVENOUS

## 2015-09-03 MED ORDER — MENTHOL 3 MG MT LOZG: 1.0000 | LOZENGE | OROMUCOSAL | Status: DC | PRN

## 2015-09-03 MED ORDER — ROCURONIUM BROMIDE 100 MG/10ML IV SOLN
INTRAVENOUS | Status: DC | PRN
  Administered 2015-09-03: 50 mg via INTRAVENOUS

## 2015-09-03 MED ORDER — SERTRALINE HCL 25 MG PO TABS
25.0000 mg | ORAL_TABLET | Freq: Two times a day (BID) | ORAL | Status: DC
  Administered 2015-09-03 – 2015-09-06 (×6): 25 mg via ORAL
  Filled 2015-09-03 (×7): qty 1

## 2015-09-03 MED ORDER — OXYCODONE HCL 20 MG PO TABS: 1.0000 | ORAL_TABLET | Freq: Four times a day (QID) | ORAL | Status: AC

## 2015-09-03 MED ORDER — ACETAMINOPHEN 650 MG RE SUPP: 650.0000 mg | RECTAL | Status: DC | PRN

## 2015-09-03 MED ORDER — CETIRIZINE HCL 10 MG PO TABS
10.0000 mg | ORAL_TABLET | Freq: Every evening | ORAL | Status: DC
  Administered 2015-09-04 – 2015-09-05 (×2): 10 mg via ORAL
  Filled 2015-09-03 (×4): qty 1

## 2015-09-03 MED ORDER — CHLORHEXIDINE GLUCONATE 4 % EX LIQD: 60.0000 mL | Freq: Once | CUTANEOUS | Status: DC

## 2015-09-03 MED ORDER — PROPOFOL 10 MG/ML IV BOLUS
INTRAVENOUS | Status: DC | PRN
  Administered 2015-09-03: 300 mg via INTRAVENOUS

## 2015-09-03 MED ORDER — TRAMADOL HCL 50 MG PO TABS: 100.0000 mg | ORAL_TABLET | Freq: Four times a day (QID) | ORAL | Status: DC | PRN

## 2015-09-03 MED ORDER — PHENYLEPHRINE HCL 10 MG/ML IJ SOLN
INTRAMUSCULAR | Status: DC | PRN
  Administered 2015-09-03 (×2): 80 ug via INTRAVENOUS

## 2015-09-03 MED ORDER — ONDANSETRON HCL 4 MG/2ML IJ SOLN
INTRAMUSCULAR | Status: AC
  Filled 2015-09-03: qty 4

## 2015-09-03 MED ORDER — METHOCARBAMOL 1000 MG/10ML IJ SOLN
500.0000 mg | Freq: Four times a day (QID) | INTRAVENOUS | Status: DC | PRN
  Filled 2015-09-03: qty 5

## 2015-09-03 MED ORDER — MORPHINE SULFATE ER 30 MG PO TBCR: 30.0000 mg | EXTENDED_RELEASE_TABLET | Freq: Two times a day (BID) | ORAL | Status: AC

## 2015-09-03 MED ORDER — SODIUM CHLORIDE 0.9% FLUSH
3.0000 mL | Freq: Two times a day (BID) | INTRAVENOUS | Status: DC
  Administered 2015-09-04 – 2015-09-05 (×2): 3 mL via INTRAVENOUS

## 2015-09-03 MED ORDER — PROPOFOL 10 MG/ML IV BOLUS
INTRAVENOUS | Status: AC
  Filled 2015-09-03: qty 20

## 2015-09-03 MED ORDER — HYDROCODONE-ACETAMINOPHEN 5-325 MG PO TABS: 1.0000 | ORAL_TABLET | ORAL | Status: DC | PRN

## 2015-09-03 MED ORDER — MONTELUKAST SODIUM 10 MG PO TABS
10.0000 mg | ORAL_TABLET | Freq: Every day | ORAL | Status: DC
  Administered 2015-09-03 – 2015-09-05 (×3): 10 mg via ORAL
  Filled 2015-09-03 (×3): qty 1

## 2015-09-03 MED ORDER — FENTANYL CITRATE (PF) 100 MCG/2ML IJ SOLN
INTRAMUSCULAR | Status: AC
  Administered 2015-09-03: 50 ug via INTRAVENOUS
  Filled 2015-09-03: qty 2

## 2015-09-03 MED ORDER — AZELASTINE HCL 0.1 % NA SOLN
2.0000 | Freq: Two times a day (BID) | NASAL | Status: DC
  Filled 2015-09-03: qty 30

## 2015-09-03 MED ORDER — FUROSEMIDE 40 MG PO TABS
40.0000 mg | ORAL_TABLET | Freq: Every day | ORAL | Status: DC
  Administered 2015-09-03 – 2015-09-06 (×4): 40 mg via ORAL
  Filled 2015-09-03 (×5): qty 1

## 2015-09-03 MED ORDER — AMLODIPINE BESYLATE 10 MG PO TABS
10.0000 mg | ORAL_TABLET | Freq: Every day | ORAL | Status: DC
  Administered 2015-09-04 – 2015-09-06 (×3): 10 mg via ORAL
  Filled 2015-09-03 (×4): qty 1

## 2015-09-03 MED ORDER — ONDANSETRON HCL 4 MG/2ML IJ SOLN
INTRAMUSCULAR | Status: DC | PRN
  Administered 2015-09-03: 4 mg via INTRAVENOUS

## 2015-09-03 MED ORDER — KETAMINE HCL 10 MG/ML IJ SOLN
INTRAMUSCULAR | Status: DC | PRN
  Administered 2015-09-03: 50 mg via INTRAVENOUS
  Administered 2015-09-03: 20 mg via INTRAVENOUS
  Administered 2015-09-03: 10 mg via INTRAVENOUS

## 2015-09-03 MED ORDER — TRIAMCINOLONE ACETONIDE 0.1 % EX OINT
1.0000 "application " | TOPICAL_OINTMENT | Freq: Two times a day (BID) | CUTANEOUS | Status: DC
  Administered 2015-09-05: 1 via TOPICAL
  Filled 2015-09-03: qty 15

## 2015-09-03 MED ORDER — FENTANYL CITRATE (PF) 100 MCG/2ML IJ SOLN
25.0000 ug | INTRAMUSCULAR | Status: DC | PRN
  Administered 2015-09-03 (×2): 50 ug via INTRAVENOUS

## 2015-09-03 MED ORDER — KETAMINE HCL 100 MG/ML IJ SOLN
INTRAMUSCULAR | Status: AC
  Filled 2015-09-03: qty 1

## 2015-09-03 MED ORDER — THROMBIN 20000 UNITS EX SOLR
CUTANEOUS | Status: AC
  Filled 2015-09-03: qty 20000

## 2015-09-03 MED ORDER — ONDANSETRON HCL 4 MG/2ML IJ SOLN: 4.0000 mg | INTRAMUSCULAR | Status: DC | PRN

## 2015-09-03 MED ORDER — SUCRALFATE 1 G PO TABS
1.0000 g | ORAL_TABLET | Freq: Three times a day (TID) | ORAL | Status: DC
  Administered 2015-09-03 – 2015-09-06 (×9): 1 g via ORAL
  Filled 2015-09-03 (×11): qty 1

## 2015-09-03 MED ORDER — LACTATED RINGERS IV SOLN
INTRAVENOUS | Status: DC | PRN
  Administered 2015-09-03: 13:00:00 via INTRAVENOUS

## 2015-09-03 MED ORDER — LOSARTAN POTASSIUM 50 MG PO TABS
50.0000 mg | ORAL_TABLET | Freq: Every day | ORAL | Status: DC
  Administered 2015-09-04 – 2015-09-06 (×3): 50 mg via ORAL
  Filled 2015-09-03 (×4): qty 1

## 2015-09-03 MED ORDER — LIDOCAINE HCL (CARDIAC) 20 MG/ML IV SOLN
INTRAVENOUS | Status: DC | PRN
  Administered 2015-09-03: 50 mg via INTRAVENOUS

## 2015-09-03 MED ORDER — ACETAMINOPHEN 325 MG PO TABS: 650.0000 mg | ORAL_TABLET | ORAL | Status: DC | PRN

## 2015-09-03 MED ORDER — MORPHINE SULFATE (PF) 2 MG/ML IV SOLN
1.0000 mg | INTRAVENOUS | Status: DC | PRN
  Administered 2015-09-03 (×2): 2 mg via INTRAVENOUS
  Administered 2015-09-04 (×2): 4 mg via INTRAVENOUS
  Filled 2015-09-03 (×2): qty 1
  Filled 2015-09-03 (×2): qty 2

## 2015-09-03 MED ORDER — LORAZEPAM 1 MG PO TABS: 1.0000 mg | ORAL_TABLET | Freq: Two times a day (BID) | ORAL | Status: DC | PRN

## 2015-09-03 MED ORDER — EPHEDRINE SULFATE 50 MG/ML IJ SOLN
INTRAMUSCULAR | Status: DC | PRN
  Administered 2015-09-03 (×2): 10 mg via INTRAVENOUS

## 2015-09-03 MED ORDER — FOLIC ACID 1 MG PO TABS
2.0000 mg | ORAL_TABLET | Freq: Every day | ORAL | Status: DC
  Administered 2015-09-04 – 2015-09-06 (×3): 2 mg via ORAL
  Filled 2015-09-03 (×4): qty 2

## 2015-09-03 MED ORDER — BUPIVACAINE LIPOSOME 1.3 % IJ SUSP
20.0000 mL | INTRAMUSCULAR | Status: DC
  Filled 2015-09-03: qty 20

## 2015-09-03 MED ORDER — FAMOTIDINE IN NACL 20-0.9 MG/50ML-% IV SOLN
20.0000 mg | Freq: Two times a day (BID) | INTRAVENOUS | Status: DC
  Filled 2015-09-03 (×3): qty 50

## 2015-09-03 MED ORDER — DOCUSATE SODIUM 100 MG PO CAPS
100.0000 mg | ORAL_CAPSULE | Freq: Two times a day (BID) | ORAL | Status: DC
  Administered 2015-09-03 – 2015-09-06 (×6): 100 mg via ORAL
  Filled 2015-09-03 (×7): qty 1

## 2015-09-03 MED ORDER — LACTATED RINGERS IV SOLN
INTRAVENOUS | Status: DC | PRN
  Administered 2015-09-03 (×2): via INTRAVENOUS

## 2015-09-03 MED ORDER — ARTIFICIAL TEARS OP OINT
TOPICAL_OINTMENT | OPHTHALMIC | Status: DC | PRN
  Administered 2015-09-03: 1 via OPHTHALMIC

## 2015-09-03 MED ORDER — MORPHINE SULFATE ER 30 MG PO TBCR
30.0000 mg | EXTENDED_RELEASE_TABLET | Freq: Once | ORAL | Status: AC
  Administered 2015-09-03: 30 mg via ORAL

## 2015-09-03 MED ORDER — ALLOPURINOL 100 MG PO TABS
100.0000 mg | ORAL_TABLET | Freq: Two times a day (BID) | ORAL | Status: DC
  Administered 2015-09-03 – 2015-09-06 (×6): 100 mg via ORAL
  Filled 2015-09-03 (×7): qty 1

## 2015-09-03 MED ORDER — ROCURONIUM BROMIDE 50 MG/5ML IV SOLN
INTRAVENOUS | Status: AC
  Filled 2015-09-03: qty 1

## 2015-09-03 MED ORDER — SODIUM CHLORIDE 0.9 % IV SOLN: 250.0000 mL | INTRAVENOUS | Status: DC

## 2015-09-03 MED ORDER — SUCCINYLCHOLINE CHLORIDE 20 MG/ML IJ SOLN
INTRAMUSCULAR | Status: DC | PRN
  Administered 2015-09-03: 200 mg via INTRAVENOUS

## 2015-09-03 MED ORDER — OXYCODONE HCL 5 MG PO TABS
20.0000 mg | ORAL_TABLET | Freq: Once | ORAL | Status: AC
  Administered 2015-09-03: 20 mg via ORAL
  Filled 2015-09-03: qty 4

## 2015-09-03 MED ORDER — HYDROXYZINE HCL 50 MG PO TABS
50.0000 mg | ORAL_TABLET | Freq: Three times a day (TID) | ORAL | Status: DC | PRN
  Administered 2015-09-03: 50 mg via ORAL
  Filled 2015-09-03 (×2): qty 1

## 2015-09-03 MED ORDER — MIDAZOLAM HCL 2 MG/2ML IJ SOLN
INTRAMUSCULAR | Status: AC
  Filled 2015-09-03: qty 2

## 2015-09-03 MED ORDER — ARTIFICIAL TEARS OP OINT
TOPICAL_OINTMENT | OPHTHALMIC | Status: AC
  Filled 2015-09-03: qty 3.5

## 2015-09-03 SURGICAL SUPPLY — 57 items
BLADE SURG CLIPPER 3M 9600 (MISCELLANEOUS) ×3 IMPLANT
BUR SABER RD CUTTING 3.0 (BURR) ×2 IMPLANT
BUR SABER RD CUTTING 3.0MM (BURR) ×1
CANISTER SUCTION 2500CC (MISCELLANEOUS) ×3 IMPLANT
CATH ROBINSON RED A/P 14FR (CATHETERS) ×3 IMPLANT
COVER SURGICAL LIGHT HANDLE (MISCELLANEOUS) ×3 IMPLANT
DERMABOND ADVANCED (GAUZE/BANDAGES/DRESSINGS) ×2
DERMABOND ADVANCED .7 DNX12 (GAUZE/BANDAGES/DRESSINGS) ×1 IMPLANT
DRAPE MICROSCOPE LEICA (MISCELLANEOUS) ×3 IMPLANT
DRAPE PROXIMA HALF (DRAPES) IMPLANT
DRAPE SURG 17X23 STRL (DRAPES) ×12 IMPLANT
DRSG MEPILEX BORDER 4X4 (GAUZE/BANDAGES/DRESSINGS) IMPLANT
DRSG MEPILEX BORDER 4X8 (GAUZE/BANDAGES/DRESSINGS) ×3 IMPLANT
DRSG PAD ABDOMINAL 8X10 ST (GAUZE/BANDAGES/DRESSINGS) ×3 IMPLANT
DURAPREP 26ML APPLICATOR (WOUND CARE) ×3 IMPLANT
ELECT BLADE 4.0 EZ CLEAN MEGAD (MISCELLANEOUS) ×3
ELECT CAUTERY BLADE 6.4 (BLADE) ×3 IMPLANT
ELECT REM PT RETURN 9FT ADLT (ELECTROSURGICAL) ×3
ELECTRODE BLDE 4.0 EZ CLN MEGD (MISCELLANEOUS) ×1 IMPLANT
ELECTRODE REM PT RTRN 9FT ADLT (ELECTROSURGICAL) ×1 IMPLANT
GLOVE BIOGEL PI IND STRL 6.5 (GLOVE) ×1 IMPLANT
GLOVE BIOGEL PI IND STRL 8 (GLOVE) ×1 IMPLANT
GLOVE BIOGEL PI INDICATOR 6.5 (GLOVE) ×2
GLOVE BIOGEL PI INDICATOR 8 (GLOVE) ×2
GLOVE ECLIPSE 8.5 STRL (GLOVE) ×3 IMPLANT
GLOVE ORTHO TXT STRL SZ7.5 (GLOVE) ×3 IMPLANT
GLOVE SURG 8.5 LATEX PF (GLOVE) ×3 IMPLANT
GLOVE SURG SS PI 6.5 STRL IVOR (GLOVE) ×6 IMPLANT
GOWN STRL REUS W/ TWL LRG LVL3 (GOWN DISPOSABLE) ×2 IMPLANT
GOWN STRL REUS W/TWL 2XL LVL3 (GOWN DISPOSABLE) ×6 IMPLANT
GOWN STRL REUS W/TWL LRG LVL3 (GOWN DISPOSABLE) ×4
KIT BASIN OR (CUSTOM PROCEDURE TRAY) ×3 IMPLANT
KIT ROOM TURNOVER OR (KITS) ×3 IMPLANT
NEEDLE SPNL 18GX3.5 QUINCKE PK (NEEDLE) ×6 IMPLANT
NS IRRIG 1000ML POUR BTL (IV SOLUTION) ×3 IMPLANT
PACK LAMINECTOMY ORTHO (CUSTOM PROCEDURE TRAY) ×3 IMPLANT
PAD ARMBOARD 7.5X6 YLW CONV (MISCELLANEOUS) ×9 IMPLANT
PATTIES SURGICAL .5 X.5 (GAUZE/BANDAGES/DRESSINGS) IMPLANT
PATTIES SURGICAL .75X.75 (GAUZE/BANDAGES/DRESSINGS) IMPLANT
SPONGE GAUZE 4X4 12PLY STER LF (GAUZE/BANDAGES/DRESSINGS) ×3 IMPLANT
SPONGE LAP 4X18 X RAY DECT (DISPOSABLE) ×3 IMPLANT
SPONGE SURGIFOAM ABS GEL 100 (HEMOSTASIS) ×3 IMPLANT
STAPLER VISISTAT 35W (STAPLE) ×3 IMPLANT
SUT PROLENE 1 CT (SUTURE) ×6 IMPLANT
SUT VIC AB 1 CTX 36 (SUTURE) ×10
SUT VIC AB 1 CTX36XBRD ANBCTR (SUTURE) ×5 IMPLANT
SUT VIC AB 2-0 CT1 27 (SUTURE) ×2
SUT VIC AB 2-0 CT1 TAPERPNT 27 (SUTURE) ×1 IMPLANT
SUT VIC AB 3-0 X1 27 (SUTURE) ×3 IMPLANT
SUT VICRYL 0 UR6 27IN ABS (SUTURE) IMPLANT
SYR 20CC LL (SYRINGE) ×3 IMPLANT
SYR CONTROL 10ML LL (SYRINGE) ×3 IMPLANT
TAPE CLOTH SURG 4X10 WHT LF (GAUZE/BANDAGES/DRESSINGS) ×3 IMPLANT
TOWEL OR 17X24 6PK STRL BLUE (TOWEL DISPOSABLE) ×3 IMPLANT
TOWEL OR 17X26 10 PK STRL BLUE (TOWEL DISPOSABLE) ×3 IMPLANT
TRAY FOLEY CATH 16FRSI W/METER (SET/KITS/TRAYS/PACK) ×3 IMPLANT
WATER STERILE IRR 1000ML POUR (IV SOLUTION) ×3 IMPLANT

## 2015-09-03 NOTE — Op Note (Signed)
09/03/2015  4:00 PM  PATIENT:  Phillip Rocha  55 y.o. male  MRN: [id number]  OPERATIVE REPORT  PRE-OPERATIVE DIAGNOSIS:  L1-2, L2-3 severe spinal stenosis  POST-OPERATIVE DIAGNOSIS:  L1-2, L2-3 severe spinal stenosis  PROCEDURE:  Procedure(s): BILATERAL PARTIAL HEMILAMINECTOMIES L1-2 AND L2-3    SURGEON:  Jessy Oto, MD     ASSISTANT:  Benjiman Core, PA-C  (Present throughout the entire procedure and necessary for completion of procedure in a timely manner)     ANESTHESIA:  General,supplemented with local anesthesia marcaine 0.5% 1:1 exparel 1.3% total 30cc. Dr. Gifford Shave.    COMPLICATIONS:  None.     DRAINS: Foley to SD.   PROCEDURE: The patient was met in the holding area, and the appropriate L2-3 to L4-5 levels identified and not marked with an "X" and my initials since this was a bilateral procedure. The patient was then transported to OR and was placed under general anesthesia without difficulty. Then placed on the operative table in a prone position. [facility] spine OR table was used. The patient received appropriate preoperative antibiotic prophylaxis. Nursing staff inserted a Foley catheter under sterile conditions prior to turning. Standard prep with DuraPrep solution from the mid dorsal spine to the mid sacral level.Time-out procedure was called and correct .  Patient was draped in the usual manner. Iodine Vi-Drape was used.Initial incision was made at L1-2 and at the lowest aspect of the incision at the expected level of L4 approximately 2 1/2inches in length. The incision were infiltrated with marcaine 1/2% plain:exparel 1.3% 1:1. Total of 20 cc were used. Skin subcutaneous layers divide down to the lumbodorsal fascia this was incised in both L2, L3 and Superior L4 spinous processes, clamps placed at the interspinous process at T12-L1 and L1-2. A lateral radiograph demonstrated the upper clamp at theT12-L1 interspinous space. Lower clamp was at the L1-2 interspinous space.  The L2-3 to L1-2 levels were then exposed using Cobb elevators and electrocautery. These areas were then packed. Boss McCollough retractor was inserted at the incision site. Leksell rongeur used to remove a portion of the inferior aspect is process of L1 40% and the entire spinous processes of L2 and superior 30% of L3. Leksell rongeur was then used to further remove bone down to the ligamentum flavum and the central portions of the lamina and base of the spinous process were thinned. 4 mm burr was also used to thin the lamina of L2 centrally. The facets were then exposed out laterally and were hypertrophic.Osteotomes then used medial aspect of the L2-3 facet and L1-2 facet bilaterally resecting approximately 10-15% of the facet bilaterally. Kerrisons were then used to resect central portions of the superior lamina of L3 and the entire L2 lamina. Ligamentum flavum at the L1-2 and L2-3 levels were then carefully resected centrally preserving at least 7-8 mm with at the pars level L1 and L3. The central laminectomy was further widened performing more resection of the medial aspect of facet at both L2-3 and L1-2. Note that loupe magnification and headlight was used for this portion procedure.the central portions of the ligamentum flavum at the L2-3 level were resected and the medial aspect of the L2-3 facet resected over about 5-10%. Hypertrophic flava was found to be present. The operating room microscope sterilely draped brought into the field and then carefully the right side decompressed at each level beginning at the right L1 neuroforamen resect bone over the superior lamina of L2 and decompressing the right L1-2 foramen and reflected  portion ligamentum flavum off the medial aspect of the right L1-2 facet. Hockey-stick nerve probe could be passed out both the L1 and L2 neuroforamen. The medial aspect of facet at the L2-3 level was then carefully evaluated and hypertrophic ligamentum flavum resected using Kerrisons  decompressing the lateral recess on this right side. This was done such that hockey-stick nerve probe could be used to pass the outer recess demonstrating patency and decompression of the L2 nerve root and L3 nerve roots. Attention then turned to the L2-3 were further debridement of the lateral recess and hypertrophic ligamentum flavum and reflected portions of ligamentum flavum were carried out. Changing sides of the OR table in decompression was carried out in similar fashion on the left side from the left L3 nerve root to the L2-3 level. At the left L2-3 level severe lateral recess stenosis was noted lateral recess was decompressed and the L3 nerve root freed up.  Further decompression was then carried out the left side of the spinal canal decompressing the L1, the L2 and the L3 bilateral nerve roots. Following this then a hockey-stick nerve probe could be passed out easily bilateral L1,L2 and L3  neuroforamen. Following decompression bilaterally thrombin-soaked Gelfoam was applied to the laminotomy defects and these areas packed with small sponges. Adequate hemostasis was obtained at all levels. The thrombin-soaked Gelfoam was then removed. Boss McCollough retractor was then removed. The end of the case, an approximately 15 mm central laminotomy was present with normal pulsation of the thecal sac present. Following additional irrigation and with no active bleeding present at any level level, the incision was then closed by first approximating the lumbar muscles the midline with interrupted #1 Vicryl sutures loosely the lumbodorsal fascia then approximated with interrupted #1 Vicryl sutures. Deep subcutaneous layers approximated with interrupted #0 Vicryl suture more superficial layers with interrupted 2-0 Vicryl suture the subcutaneus layers were closed with 3 x #1 Prolene stay sutures far near near far.A mepilex bandage was applied to the midline incision site and tegaderm to the hemovac drain exit site. All  instrument and sponge counts were correct. Patient was then reactivated returned to supine position and extubated. He was then returned to recovery room in satisfactory condition.   Benjiman Core, PA-C performed the duties of assistant surgeon throughout this case she assisted using loupe magnification and OR microscope retracting delicate neural structures suctioning over the neural structures throughout the case bilaterally . He was present from the beginning of the case to the end of the case. Assisted in positioning the patient and  the arm and legs. He also participated in removal the patient from the operating table.    Phillip Rocha  09/03/2015, 4:00 PM

## 2015-09-03 NOTE — Brief Op Note (Signed)
PATIENT ID:      Phillip Rocha  MRN:     [id number] DOB/AGE:    1960/11/09 / 55 y.o.       OPERATIVE REPORT   DATE OF PROCEDURE:  09/03/2015      PREOPERATIVE DIAGNOSIS:   L1-2, L2-3 severe spinal stenosis                                                       Body mass index is 51.63 kg/(m^2).    POSTOPERATIVE DIAGNOSIS:   L1-2, L2-3 severe spinal stenosis                                                                     Body mass index is 51.63 kg/(m^2).    PROCEDURE:  Procedure(s): Central laminectomies L1-2 AND L2-3    SURGEON: NITKA,JAMES E   ASSISTANT: Esaw Grandchild          ANESTHESIA:  General  Supplemented with local marcaine 0.5% 1:1 exparel 1.3% total 30cc, Dr. Hoy Morn.  EBL:200CC  DRAINS:Foley to SD  COMPONENTS:  * No implants in log *  COMPLICATIONS:  None   CONDITION:  stable    NITKA,JAMES E 09/03/2015, 3:56 PM

## 2015-09-03 NOTE — Transfer of Care (Signed)
Immediate Anesthesia Transfer of Care Note  Patient: Phillip Rocha  Procedure(s) Performed: Procedure(s): BILATERAL PARTIAL HEMILAMINECTOMIES L1-2 AND L2-3 (N/A)  Patient Location: PACU  Anesthesia Type:General  Level of Consciousness: awake, alert , oriented and patient cooperative  Airway & Oxygen Therapy: Patient Spontanous Breathing and Patient connected to face mask oxygen  Post-op Assessment: Report given to RN and Post -op Vital signs reviewed and stable  Post vital signs: Reviewed and stable  Last Vitals:  Filed Vitals:   09/03/15 1030 09/03/15 1613  BP: 143/59 151/79  Pulse: 75   Temp: 36.8 C 36.4 C  Resp: 20 18    Last Pain:  Filed Vitals:   09/03/15 1619  PainSc: 9       Patients Stated Pain Goal: 5 (62/69/48 5462)  Complications: No apparent anesthesia complications

## 2015-09-03 NOTE — Anesthesia Postprocedure Evaluation (Signed)
Anesthesia Post Note  Patient: Phillip Rocha  Procedure(s) Performed: Procedure(s) (LRB): BILATERAL PARTIAL HEMILAMINECTOMIES L1-2 AND L2-3 (N/A)  Patient location during evaluation: PACU Anesthesia Type: General Level of consciousness: awake and sedated Pain control: Still c pain complanits. Vital Signs Assessment: post-procedure vital signs reviewed and stable Respiratory status: spontaneous breathing, nonlabored ventilation, respiratory function stable and patient connected to face mask oxygen Cardiovascular status: blood pressure returned to baseline and stable Postop Assessment: no signs of nausea or vomiting Anesthetic complications: no    Last Vitals:  Filed Vitals:   09/03/15 1730 09/03/15 1745  BP: 136/77 151/81  Pulse: 76 79  Temp:  36.4 C  Resp: 10 7    Last Pain:  Filed Vitals:   09/03/15 1753  PainSc: 5                  Beretta Ginsberg,JAMES TERRILL

## 2015-09-03 NOTE — Anesthesia Procedure Notes (Signed)
Procedure Name: Intubation Date/Time: 09/03/2015 12:47 PM Performed by: Neldon Newport Pre-anesthesia Checklist: Timeout performed, Patient being monitored, Suction available, Emergency Drugs available and Patient identified Patient Re-evaluated:Patient Re-evaluated prior to inductionOxygen Delivery Method: Circle system utilized Preoxygenation: Pre-oxygenation with 100% oxygen Intubation Type: IV induction Ventilation: Mask ventilation without difficulty Laryngoscope Size: Mac and 4 Grade View: Grade II Tube type: Oral Tube size: 7.5 mm Number of attempts: 1 Placement Confirmation: breath sounds checked- equal and bilateral,  positive ETCO2 and ETT inserted through vocal cords under direct vision Secured at: 24 cm Tube secured with: Tape Dental Injury: Teeth and Oropharynx as per pre-operative assessment

## 2015-09-03 NOTE — Progress Notes (Signed)
Phillip Rocha is a 55 y.o. male patient admitted from PACU awake, alert - oriented  X 4 - no acute distress noted.  VSS - Blood pressure 152/70, pulse 79, temperature 97.7 F (36.5 C), temperature source Oral, resp. rate 15, weight 167.831 kg (370 lb), SpO2 100 %.    IV in place, occlusive dsg intact without redness.  Orientation to room, and floor completed with information packet given to patient/family.  Patient declined safety video at this time.  Admission INP armband ID verified with patient/family, and in place.   SR up x 2, fall assessment complete, with patient and family able to verbalize understanding of risk associated with falls, and verbalized understanding to call nsg before up out of bed.  Call light within reach, patient able to voice, and demonstrate understanding.     Dressing clean dry and intact.    Will cont to eval and treat per MD orders.  Janalyn Shy, RN 09/03/2015 6:29 PM
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
## 2015-09-03 NOTE — Discharge Instructions (Signed)
No lifting greater than 10 lbs. Avoid bending, stooping and twisting. Walk in house for first week them may start to get out slowly increasing distance up to one quarter mile by 3 weeks post op. Keep incision dry for 3 days, may use tegaderm or similar water impervious dressing.

## 2015-09-03 NOTE — Interval H&P Note (Signed)
The patient has been re-examined, and the chart reviewed, and there have been no interval changes to the documented history and physical.    The risks, benefits, and alternatives have been discussed at length, and the patient is willing to proceed.

## 2015-09-03 NOTE — Anesthesia Preprocedure Evaluation (Addendum)
Anesthesia Evaluation  Patient identified by MRN, date of birth, ID band Patient awake  General Assessment Comment:History includes DM2, HTN, GERD, opioid dependence, cholecystectomy, anemia. Never smoker. BMI 52.   - S/p redo lumbar interbody fusion 81/1/91 (complicated by significant blood loss; acute respiratory failure with hypoxia (was maintained on ventilator for 5 days), acute renal failure).  - S/p L TKA 08/21/13.  - S/p R TKA 10/01/12.  - S/p L3-S1 fusion 4/78/29 (complicated by acute kidney injury, anemia, and post-operative ileus with associated acute respiratory failure with hypoxia (felt related to mechanical obstruction from abdominal distention, pain medication, body habitus, possibly undiagnosed OSA)).   Reviewed: Allergy & Precautions, NPO status , Patient's Chart, lab work & pertinent test results  Airway Mallampati: II  TM Distance: >3 FB Neck ROM: Full    Dental  (+) Teeth Intact, Dental Advisory Given, Missing   Pulmonary sleep apnea ,  OSA screening score positive as it has been in the past; per PAT notes, pt refuses further testing; has needed Bi-Pap post-operatively in the past.    Pulmonary exam normal breath sounds clear to auscultation       Cardiovascular hypertension, Pt. on medications (-) angina(-) Past MI Normal cardiovascular exam Rhythm:Regular Rate:Normal  Echo 11/20/14:  - Left ventricle: Poor acoustic windows With echocontrast LVEF appears grossly normal. Cannot fully evaluate regional wall motion as not all segments are visualized. The cavity size was normal. Wall thickness was normal. - Right ventricle: RV is not seen well enough to evaluate function.   Neuro/Psych  Headaches, PSYCHIATRIC DISORDERS Anxiety Depression  Neuromuscular disease    GI/Hepatic GERD  Medicated,(+)     substance abuse (opioid dependence)  ,   Endo/Other  diabetes, Type 2, Oral Hypoglycemic AgentsMorbid obesity   Renal/GU negative Renal ROS     Musculoskeletal  (+) Arthritis  (psoriatic arthritis), Osteoarthritis,    Abdominal   Peds  Hematology  (+) Blood dyscrasia, anemia ,   Anesthesia Other Findings Day of surgery medications reviewed with the patient.  Reproductive/Obstetrics                      Anesthesia Physical Anesthesia Plan  ASA: IV  Anesthesia Plan: General   Post-op Pain Management:    Induction: Intravenous  Airway Management Planned: Oral ETT  Additional Equipment:   Intra-op Plan:   Post-operative Plan: Extubation in OR  Informed Consent: I have reviewed the patients History and Physical, chart, labs and discussed the procedure including the risks, benefits and alternatives for the proposed anesthesia with the patient or authorized representative who has indicated his/her understanding and acceptance.   Dental advisory given  Plan Discussed with: CRNA  Anesthesia Plan Comments: (Risks/benefits of general anesthesia discussed with patient including risk of damage to teeth, lips, gum, and tongue, nausea/vomiting, allergic reactions to medications, and the possibility of heart attack, stroke and death.  All patient questions answered.  Patient wishes to proceed.)        Anesthesia Quick Evaluation

## 2015-09-04 LAB — CBC
HEMATOCRIT: 38 % — AB (ref 39.0–52.0)
HEMOGLOBIN: 11.6 g/dL — AB (ref 13.0–17.0)
MCH: 23.5 pg — ABNORMAL LOW (ref 26.0–34.0)
MCHC: 30.5 g/dL (ref 30.0–36.0)
MCV: 77.1 fL — ABNORMAL LOW (ref 78.0–100.0)
Platelets: 221 10*3/uL (ref 150–400)
RBC: 4.93 MIL/uL (ref 4.22–5.81)
RDW: 20.3 % — ABNORMAL HIGH (ref 11.5–15.5)
WBC: 14.6 10*3/uL — AB (ref 4.0–10.5)

## 2015-09-04 LAB — GLUCOSE, CAPILLARY
GLUCOSE-CAPILLARY: 214 mg/dL — AB (ref 65–99)
GLUCOSE-CAPILLARY: 185 mg/dL — AB (ref 65–99)
GLUCOSE-CAPILLARY: 151 mg/dL — AB (ref 65–99)
GLUCOSE-CAPILLARY: 160 mg/dL — AB (ref 65–99)
Glucose-Capillary: 154 mg/dL — ABNORMAL HIGH (ref 65–99)
Glucose-Capillary: 159 mg/dL — ABNORMAL HIGH (ref 65–99)

## 2015-09-04 LAB — COMPREHENSIVE METABOLIC PANEL
ALBUMIN: 3.7 g/dL (ref 3.5–5.0)
ALK PHOS: 97 U/L (ref 38–126)
ALT: 15 U/L — AB (ref 17–63)
ANION GAP: 13 (ref 5–15)
AST: 24 U/L (ref 15–41)
BILIRUBIN TOTAL: 0.4 mg/dL (ref 0.3–1.2)
BUN: 14 mg/dL (ref 6–20)
CALCIUM: 8.2 mg/dL — AB (ref 8.9–10.3)
CO2: 19 mmol/L — AB (ref 22–32)
Chloride: 103 mmol/L (ref 101–111)
Creatinine, Ser: 1.33 mg/dL — ABNORMAL HIGH (ref 0.61–1.24)
GFR calc Af Amer: >60 mL/min (ref >60)
GFR calc non Af Amer: 58 mL/min — ABNORMAL LOW (ref >60)
GLUCOSE: 195 mg/dL — AB (ref 65–99)
Potassium: 4.7 mmol/L (ref 3.5–5.1)
SODIUM: 135 mmol/L (ref 135–145)
Total Protein: 7.2 g/dL (ref 6.5–8.1)

## 2015-09-04 LAB — HEMOGLOBIN A1C
Hgb A1c MFr Bld: 7.1 % — ABNORMAL HIGH (ref 4.8–5.6)
Mean Plasma Glucose: 157 mg/dL

## 2015-09-04 MED ORDER — OXYCODONE HCL 5 MG PO TABS
10.0000 mg | ORAL_TABLET | Freq: Four times a day (QID) | ORAL | Status: DC
  Administered 2015-09-04: 10 mg via ORAL
  Filled 2015-09-04: qty 2

## 2015-09-04 MED ORDER — MORPHINE SULFATE ER 15 MG PO TBCR
15.0000 mg | EXTENDED_RELEASE_TABLET | Freq: Two times a day (BID) | ORAL | Status: DC
  Administered 2015-09-04 – 2015-09-06 (×4): 15 mg via ORAL
  Filled 2015-09-04 (×4): qty 1

## 2015-09-04 MED ORDER — NALOXONE HCL 0.4 MG/ML IJ SOLN
0.4000 mg | Freq: Once | INTRAMUSCULAR | Status: AC
  Administered 2015-09-04: 0.4 mg via INTRAVENOUS

## 2015-09-04 MED ORDER — NALOXONE HCL 0.4 MG/ML IJ SOLN
INTRAMUSCULAR | Status: AC
  Filled 2015-09-04: qty 1

## 2015-09-04 MED ORDER — FAMOTIDINE 20 MG PO TABS
20.0000 mg | ORAL_TABLET | Freq: Two times a day (BID) | ORAL | Status: DC
  Administered 2015-09-04 – 2015-09-06 (×5): 20 mg via ORAL
  Filled 2015-09-04 (×6): qty 1

## 2015-09-04 NOTE — Evaluation (Signed)
Physical Therapy Evaluation Patient Details Name: Phillip Rocha MRN: [id number] DOB: [birth year] Today's Date: 09/04/2015   History of Present Illness  55 y.o. male s/p bilateral partial L1-2 and L2-3 hemilaminectomies. PMH significant for HTN, anemia, DM type II, psoriatic arthritis, dyspnea, L TKA (2015), back surgery (x2), and R rotator cuff surgery.  Clinical Impression  Pt presented sitting upright EOB when PT entered room. During gait pt with anteriorly positioned trunk secondary to hip flexion which he sustains due to pain from his spinal stenosis. Pt required verbal reminders to adhere to the back precautions. However, he was able to perform functional mobility without any physical assistance, just min guard for safety. Pt would continue to benefit from skilled physical therapy services at this time while admitted and after d/c to address his below listed limitations in order to improve his overall safety and independence with functional mobility.       Follow Up Recommendations SNF;Supervision/Assistance - 24 hour;Other (comment) (If does not qualify for SNF or refuses, pt will need HH PT, OT, aide and SW)    Equipment Recommendations  Other (comment) (bariatric rolling walker)    Recommendations for Other Services       Precautions / Restrictions Precautions Precautions: Back;Fall Precaution Booklet Issued: Yes (comment) Precaution Comments: Educated pt on all back precautions and reviewed handout Restrictions Weight Bearing Restrictions: No      Mobility  Bed Mobility               General bed mobility comments: Pt was sitting upright at EOB upon PT entering room  Transfers Overall transfer level: Needs assistance Equipment used: Rolling walker (2 wheeled) Transfers: Sit to/from Stand Sit to Stand: Min guard         General transfer comment: close min guard assist for safety and to stabilize RW during transfer. VCs for safe hand placement and to adhere  to back precautions.  Ambulation/Gait Ambulation/Gait assistance: Min guard Ambulation Distance (Feet): 150 Feet (190f x2 with sitting rest break in between) Assistive device: Rolling walker (2 wheeled) Gait Pattern/deviations: Step-through pattern;Decreased stride length;Trunk flexed (flexed more at hips) Gait velocity: decreased Gait velocity interpretation: Below normal speed for age/gender    Stairs            Wheelchair Mobility    Modified Rankin (Stroke Patients Only)       Balance Overall balance assessment: Needs assistance Sitting-balance support: No upper extremity supported;Feet supported Sitting balance-Leahy Scale: Good     Standing balance support: Bilateral upper extremity supported;During functional activity Standing balance-Leahy Scale: Poor                               Pertinent Vitals/Pain Pain Assessment: 0-10 Pain Score: 6  Pain Location: back Pain Descriptors / Indicators: Aching;Sore Pain Intervention(s): Limited activity within patient's tolerance;Monitored during session;Repositioned;RN gave pain meds during session    HMurdoexpects to be discharged to:: Private residence Living Arrangements: Alone   Type of Home: House Home Access: Stairs to enter Entrance Stairs-Rails: None (holds onto pole that supports awning) ETechnical brewerof Steps: 2 Home Layout: [facility];Wheelchair - mInsurance risk surveyorComments: Has no other furniture in the house except 1 bariatric hospital bed and 2 bariatric wheelchairs    Prior Function Level of Independence: Needs assistance   Gait / Transfers Assistance Needed: Either pushes w/c or sits in it and propels it backwards  ADL's / Homemaking Assistance Needed: Independent with all ADLs, uses SCAT for all transportation        Hand Dominance   Dominant Hand: Right    Extremity/Trunk Assessment   Upper  Extremity Assessment: Defer to OT evaluation           Lower Extremity Assessment: Generalized weakness      Cervical / Trunk Assessment: Kyphotic;Other exceptions  Communication   Communication: No difficulties  Cognition Arousal/Alertness: Awake/alert Behavior During Therapy: WFL for tasks assessed/performed Overall Cognitive Status: Within Functional Limits for tasks assessed                      General Comments      Exercises        Assessment/Plan    PT Assessment Patient needs continued PT services  PT Diagnosis Difficulty walking   PT Problem List Decreased strength;Decreased range of motion;Decreased activity tolerance;Decreased balance;Decreased mobility;Decreased coordination;Pain  PT Treatment Interventions DME instruction;Gait training;Stair training;Functional mobility training;Therapeutic activities;Therapeutic exercise;Balance training;Patient/family education   PT Goals (Current goals can be found in the Care Plan section) Acute Rehab PT Goals Patient Stated Goal: to go home PT Goal Formulation: With patient Time For Goal Achievement: 09/11/15 Potential to Achieve Goals: Good    Frequency Min 5X/week   Barriers to discharge Decreased caregiver support pt lives alone    Co-evaluation PT/OT/SLP Co-Evaluation/Treatment: Yes Reason for Co-Treatment: For patient/therapist safety PT goals addressed during session: Mobility/safety with mobility;Balance;Proper use of DME;Strengthening/ROM         End of Session Equipment Utilized During Treatment: Gait belt Activity Tolerance: Patient limited by fatigue;Patient limited by pain Patient left: in bed;with call bell/phone within reach;Other (comment) (sitting upright at EOB) Nurse Communication: Mobility status         Time: 1856-3149 PT Time Calculation (min) (ACUTE ONLY): 18 min   Charges:   PT Evaluation $PT Eval Moderate Complexity: 1 Procedure     PT G CodesClearnce Sorrel Esgar Barnick 09/04/2015, 2:09 PM Sherie Don, [place], DPT [phone]

## 2015-09-04 NOTE — Progress Notes (Signed)
CM called RN to confirm discharge date and RN requests CSW to arrange for SNF. Consult to CSW placed.  If disposition changes please call CM.

## 2015-09-04 NOTE — Progress Notes (Signed)
This RN  noticed pt being in sleep and not able to arouse. Upon assessment, his O2 sat was on 40s on room air. O2 provided to pt and called rapid response RN. Put pt on NRB mask per rapid response RN. Called on call  MD and charge nurse. Pt received 0.4 mg of narcan and started to response with deep sternal rub. Followed command and answer questions little by little.  BP 126/66,HR 93 RR 14,Sats 99 on NRM. He was able to sit on the edge of the bed and stabilized. Cut off his schedule pain meds dose per Dr. Marlou Sa 's verbal order. Will continue monitor pt closely.

## 2015-09-04 NOTE — Progress Notes (Addendum)
Occupational Therapy Evaluation Patient Details Name: Phillip Rocha MRN: [id number] DOB: 26-Jan-1961 Today's Date: 09/04/2015    History of Present Illness 55 y.o. male s/p bilateral partial L1-2 and L2-3 hemilaminectomies. PMH significant for HTN, anemia, DM type II, psoriatic arthritis, dyspnea, L TKA (2015), back surgery (x2), and R rotator cuff surgery.   Clinical Impression   PTA, pt was independent with ADLs, used SCAT for all transportation, and used w/c for mobility. Pt currently requires mod assist for LB ADLs and min  Guard assist for all basic transfers. Educated pt on back precautions, compensatory strategies for ADLs, and use of AE. Pt plans to d/c home with intermittent assistance from [facility] RN that he plans to set up. Pt will benefit from continued acute OT to increase independence and safety with ADLs and mobility to allow for safe discharge to the venue listed below. Due to pt's lack of 24/7 assistance, highly recommend SNF for post-acute rehab - if pt refuses he will absolutely require maximum HH services (OT, PT, aide, and SW).     Follow Up Recommendations  SNF;Supervision/Assistance - 24 hour (if pt refuses he will need HHOT, PT, aide and SW)    Equipment Recommendations  Other (comment) (Adaptive equipment)    Recommendations for Other Services       Precautions / Restrictions Precautions Precautions: Back;Fall Precaution Booklet Issued: Yes (comment) Precaution Comments: Educated pt on all back precautions and reviewed handout Restrictions Weight Bearing Restrictions: No      Mobility Bed Mobility               General bed mobility comments: Pt sitting EOB on OT arrival  Transfers Overall transfer level: Needs assistance Equipment used: Rolling walker (2 wheeled) Transfers: Sit to/from Stand Sit to Stand: Min guard         General transfer comment: close min guard assist for safety and to stabilize RW during transfer. VCs for safe hand  placement and to adhere to back precautions.    Balance Overall balance assessment: Needs assistance Sitting-balance support: No upper extremity supported;Feet supported Sitting balance-Leahy Scale: Good     Standing balance support: Bilateral upper extremity supported;During functional activity Standing balance-Leahy Scale: Poor Standing balance comment: reliant on BUE support to maintain balance                            ADL Overall ADL's : Needs assistance/impaired     Grooming: Wash/dry hands;Min guard;Standing   Upper Body Bathing: Min guard;Sitting   Lower Body Bathing: Moderate assistance;Sit to/from stand   Upper Body Dressing : Min guard;Sitting   Lower Body Dressing: Moderate assistance;Sit to/from stand   Toilet Transfer: Min guard;Cueing for safety;Ambulation;Regular Toilet;RW   Toileting- Clothing Manipulation and Hygiene: Minimal assistance;Sit to/from stand;Cueing for back precautions       Functional mobility during ADLs: Min guard;Rolling walker General ADL Comments: Educated on back precautions, however pt has difficulty adhering to them due to body habitus     Vision Vision Assessment?: No apparent visual deficits   Perception     Praxis      Pertinent Vitals/Pain Pain Assessment: 0-10 Pain Score: 6  Pain Location: back Pain Descriptors / Indicators: Aching;Sore Pain Intervention(s): Limited activity within patient's tolerance;Monitored during session;Repositioned;RN gave pain meds during session     Hand Dominance Right   Extremity/Trunk Assessment Upper Extremity Assessment Upper Extremity Assessment: Overall WFL for tasks assessed   Lower Extremity Assessment Lower Extremity  Assessment: Generalized weakness   Cervical / Trunk Assessment Cervical / Trunk Assessment: Kyphotic;Other exceptions Cervical / Trunk Exceptions: s/p lumbar surgery; pt maintains near 90 degree hip flexion in standing   Communication  Communication Communication: No difficulties   Cognition Arousal/Alertness: Awake/alert Behavior During Therapy: WFL for tasks assessed/performed Overall Cognitive Status: Within Functional Limits for tasks assessed                     General Comments       Exercises       Shoulder Instructions      Home Living Family/patient expects to be discharged to:: Private residence Living Arrangements: Alone   Type of Home: House Home Access: Stairs to enter [employer] of Steps: 2 Entrance Stairs-Rails: None (holds onto pole that supports awning) Home Layout: One level     Bathroom Shower/Tub: Other (comment);Walk-in shower (uses shower at the gym only)   Biochemist, clinical: [place] [facility] bed;Wheelchair - Transport planner Comments: Has no other furniture in the house except 1 bariatric hospital bed and 2 bariatric wheelchairs      Prior Functioning/Environment Level of Independence: Needs assistance  Gait / Transfers Assistance Needed: Either pushes w/c or sits in it and propels it backwards ADL's / Homemaking Assistance Needed: Independent with all ADLs, uses SCAT for all transportation        OT Diagnosis: Acute pain;Generalized weakness   OT Problem List: Decreased strength;Impaired balance (sitting and/or standing);Decreased activity tolerance;Decreased range of motion;Decreased safety awareness;Decreased knowledge of use of DME or AE;Decreased knowledge of precautions;Obesity;Pain   OT Treatment/Interventions: Self-care/ADL training;Therapeutic exercise;DME and/or AE instruction;Therapeutic activities;Patient/family education;Balance training;Energy conservation    OT Goals(Current goals can be found in the care plan section) Acute Rehab OT Goals Patient Stated Goal: to go home OT Goal Formulation: With patient Time For Goal Achievement: 09/18/15 Potential to Achieve Goals:  Good ADL Goals Pt Will Perform Lower Body Bathing: with modified independence;with adaptive equipment;sit to/from stand Pt Will Perform Lower Body Dressing: with modified independence;with adaptive equipment;sit to/from stand Pt Will Transfer to Toilet: with modified independence;ambulating;regular height toilet Pt Will Perform Toileting - Clothing Manipulation and hygiene: with modified independence;sit to/from stand Additional ADL Goal #1: Pt will demonstrate adherence to 3/3 back precautions with min verbal cues.  OT Frequency: Min 2X/week   Barriers to D/C: Decreased caregiver support  Lives alone       Co-evaluation PT/OT/SLP Co-Evaluation/Treatment: Yes Reason for Co-Treatment: For patient/therapist safety   OT goals addressed during session: ADL's and self-care;Proper use of Adaptive equipment and DME      End of Session Equipment Utilized During Treatment: Rolling walker;Gait belt Nurse Communication: Mobility status  Activity Tolerance: Patient tolerated treatment well Patient left: in bed;with call bell/phone within reach;with nursing/sitter in room   Time: 7169-6789 OT Time Calculation (min): 19 min Charges:  OT General Charges $OT Visit: 1 Procedure OT Evaluation $OT Eval Moderate Complexity: 1 Procedure G-Codes:    Redmond Baseman, OTR/L Pager: [phone] 09/04/2015, 11:20 AM

## 2015-09-04 NOTE — Significant Event (Signed)
Rapid Response Event Note  Overview:  Called by Rn for patient very lethargic, hard to arouse and o2 sats found to be in the 40's on RA Time Called: 1728 Arrival Time: 1733 Event Type: Neurologic, Respiratory  Initial Focused Assessment:  Called by Rn for patient noted to be very lethargic, hard to arouse and o2 sats noted to be in the 40's.  RN place on 5 lpM via nasal cannula sats increased to 60's.  Recommended to increase o2 sats to NRB.  On my arrival patient was lying in bed on NRB mask sats in the low 90's, RR 8.  Patient opened eyes to deep sternal rub, would not answer questions or follow commands.  Skin is warm and dry.  BP 118/56, Sats 98% RR 16.    Interventions:  Patient given 0.4 mg IV as per protocol.  Patient responded well to narcan, now awake, oriented, moving all extremities c/o pain and feeling woozy.  Oxygen changed to nasal cannula 2 lpm, sats 99%.  Requested to sit on the side of bed to eat dinner.  Recommend RN to call MD to update on patient status.   Plan of Care (if not transferred):  RN to monitor patient  Event Summary:  Patient to remain on unit and RN to call if assistance needed   at      at          [facility], Harlin Rain

## 2015-09-04 NOTE — Progress Notes (Signed)
Subjective: Pt stable - sitting in bed - would prefer this be his last back surgery   Objective: Vital signs in last 24 hours: Temp:  [97.5 F (36.4 C)-98.3 F (36.8 C)] 97.6 F (36.4 C) (07/22 0423) Pulse Rate:  [75-91] 88 (07/22 0423) Resp:  [7-21] 16 (07/22 0423) BP: (114-160)/(59-97) 124/72 mmHg (07/22 0423) SpO2:  [92 %-100 %] 92 % (07/22 0423) Weight:  [167.831 kg (370 lb)] 167.831 kg (370 lb) (07/21 1030)  Intake/Output from previous day: 07/21 0701 - 07/22 0700 In: 2050 [I.V.:2000; IV Piggyback:50] Out: 1075 [Urine:825; Blood:250] Intake/Output this shift:    Exam:  Dorsiflexion/Plantar flexion intact  Labs:  Recent Labs  09/04/15 0414  HGB 11.6*    Recent Labs  09/04/15 0414  WBC 14.6*  RBC 4.93  HCT 38.0*  PLT 221    Recent Labs  09/04/15 0414  NA 135  K 4.7  CL 103  CO2 19*  BUN 14  CREATININE 1.33*  GLUCOSE 195*  CALCIUM 8.2*   No results for input(s): LABPT, INR in the last 72 hours.  Assessment/Plan: Mobilize with pt   DEAN,GREGORY SCOTT 09/04/2015, 10:09 AM

## 2015-09-05 ENCOUNTER — Encounter (HOSPITAL_COMMUNITY): Payer: Self-pay | Admitting: Specialist

## 2015-09-05 LAB — GLUCOSE, CAPILLARY
GLUCOSE-CAPILLARY: 140 mg/dL — AB (ref 65–99)
GLUCOSE-CAPILLARY: 131 mg/dL — AB (ref 65–99)
Glucose-Capillary: 153 mg/dL — ABNORMAL HIGH (ref 65–99)
Glucose-Capillary: 167 mg/dL — ABNORMAL HIGH (ref 65–99)
Glucose-Capillary: 148 mg/dL — ABNORMAL HIGH (ref 65–99)
Glucose-Capillary: 204 mg/dL — ABNORMAL HIGH (ref 65–99)

## 2015-09-05 MED ORDER — MORPHINE SULFATE (PF) 2 MG/ML IV SOLN: 1.0000 mg | INTRAVENOUS | Status: DC | PRN

## 2015-09-05 MED ORDER — OXYCODONE-ACETAMINOPHEN 5-325 MG PO TABS: 1.0000 | ORAL_TABLET | Freq: Four times a day (QID) | ORAL | Status: DC | PRN

## 2015-09-05 MED ORDER — HYDROXYZINE HCL 25 MG PO TABS
25.0000 mg | ORAL_TABLET | Freq: Three times a day (TID) | ORAL | Status: DC | PRN
  Administered 2015-09-06: 25 mg via ORAL

## 2015-09-05 MED ORDER — OXYCODONE HCL 5 MG PO TABS
10.0000 mg | ORAL_TABLET | Freq: Four times a day (QID) | ORAL | Status: DC | PRN
  Administered 2015-09-05 – 2015-09-06 (×2): 10 mg via ORAL
  Filled 2015-09-05 (×2): qty 2

## 2015-09-05 MED ORDER — TIZANIDINE HCL 4 MG PO TABS
4.0000 mg | ORAL_TABLET | Freq: Three times a day (TID) | ORAL | Status: DC | PRN
  Administered 2015-09-06: 4 mg via ORAL
  Filled 2015-09-05: qty 1

## 2015-09-05 NOTE — Clinical Social Work Placement (Signed)
CLINICAL SOCIAL WORK PLACEMENT  NOTE  Date:  09/05/2015  Patient Details  Name: Phillip Rocha MRN: [id number] Date of Birth: Mar 06, 1960  Clinical Social Work is seeking post-discharge placement for this patient at the [place] level of care (*CSW will initial, date and re-position this form in  chart as items are completed):  Yes   Patient/family provided with [place] Work Department's list of facilities offering this level of care within the geographic area requested by the patient (or if unable, by the patient's family).  Yes   Patient/family informed of their freedom to choose among providers that offer the needed level of care, that participate in Medicare, Medicaid or managed care program needed by the patient, have an available bed and are willing to accept the patient.  Yes   Patient/family informed of [place]'s ownership interest in [facility] and [facility], as well as of the fact that they are under no obligation to receive care at these facilities.  PASRR submitted to EDS on       PASRR number received on       Existing PASRR number confirmed on 09/05/15     FL2 transmitted to all facilities in geographic area requested by pt/family on 09/05/15     FL2 transmitted to all facilities within larger geographic area on       Patient informed that his/her managed care company has contracts with or will negotiate with certain facilities, including the following:            Patient/family informed of bed offers received.  Patient chooses bed at       Physician recommends and patient chooses bed at      Patient to be transferred to   on  .  Patient to be transferred to facility by       Patient family notified on   of transfer.  Name of family member notified:        PHYSICIAN Please prepare priority discharge summary, including medications, Please prepare prescriptions, Please sign FL2     Additional Comment:     _______________________________________________ Junie Spencer, LCSW 09/05/2015, 11:49 AM

## 2015-09-05 NOTE — Clinical Social Work Note (Signed)
Clinical Social Work Assessment  Patient Details  Name: Phillip Rocha MRN: [id number] Date of Birth: 12-Nov-1960  Date of referral:  09/05/15               Reason for consult:  Discharge Planning                Permission sought to share information with:  Case Manager, Facility Sport and exercise psychologist, Family Supports Permission granted to share information::  No  Name::        Agency::   (SNF)  Relationship::     Contact Information:     Housing/Transportation Living arrangements for the past 2 months:  Single Family Home Source of Information:  Patient, Medical Team Patient Interpreter Needed:  None Criminal Activity/Legal Involvement Pertinent to Current Situation/Hospitalization:  No - Comment as needed Significant Relationships:  Adult Children, Siblings Lives with:  Self Do you feel safe going back to the place where you live?  No Need for family participation in patient care:  Yes (Comment)  Care giving concerns:Pt expressed that he wanted to go home instead of SNF. Pt inquired about length of time he will have to spend at [facility]. CSW suggested pt speak with MD and PT regarding length of stay.  However, pt agreeable to SNF for higher level of care.    Social Worker assessment / plan: Holiday representative met with pt and discussed CSW role with discharge planning. Pt expressed that he wants to discharge home instead of SNF. CSW explained PT recommendation for short term rehab at [facility]. Pt agreeable to SNF for higher level of care.   CSW will follow up with bed offers once available.   Employment status:  Disabled (Comment on whether or not currently receiving Disability) Insurance information:  Other (Comment Required) ([place]) PT Recommendations:  [place] / Referral to community resources:  [place]  Patient/Family's Response to care:Pt lying in bed when CSW entered into the room. Pt understands SNF recommendation  and pt appears happy with care he is receiving at [facility].   Patient/Family's Understanding of and Emotional Response to Diagnosis, Current Treatment, and Prognosis: Pt appears to have a good understanding of reason for admission into the hospital and pt care plan.   Emotional Assessment Appearance:  Appears younger than stated age, Well-Groomed Attitude/Demeanor/Rapport:   (Pleasant) Affect (typically observed):  Restless, Accepting, Pleasant, Quiet Orientation:  Oriented to Self, Oriented to Place, Oriented to  Time, Oriented to Situation Alcohol / Substance use:  Not Applicable Psych involvement (Current and /or in the community):  No (Comment)  Discharge Needs  Concerns to be addressed:  Discharge Planning Concerns Readmission within the last 30 days:  No Current discharge risk:  Lives alone Barriers to Discharge:  Continued Medical Work up   [employer], LCSW 09/05/2015, 11:30 AM

## 2015-09-05 NOTE — Progress Notes (Signed)
Subjective: Pt stable - had desat episode yesterday which responded to narcan - currently asymptomatic   Objective: Vital signs in last 24 hours: Temp:  [97.7 F (36.5 C)-99.9 F (37.7 C)] 98 F (36.7 C) (07/23 0421) Pulse Rate:  [86-94] 94 (07/23 0421) Resp:  [16] 16 (07/23 0421) BP: (118-122)/(59-74) 118/69 (07/23 0421) SpO2:  [91 %-99 %] 99 % (07/23 0421)  Intake/Output from previous day: 07/22 0701 - 07/23 0700 In: 1200 [P.O.:1200] Out: -  Intake/Output this shift: No intake/output data recorded.  Exam:  Dorsiflexion/Plantar flexion intact No cellulitis present  Labs:  Recent Labs  09/04/15 0414  HGB 11.6*    Recent Labs  09/04/15 0414  WBC 14.6*  RBC 4.93  HCT 38.0*  PLT 221    Recent Labs  09/04/15 0414  NA 135  K 4.7  CL 103  CO2 19*  BUN 14  CREATININE 1.33*  GLUCOSE 195*  CALCIUM 8.2*   No results for input(s): LABPT, INR in the last 72 hours.  Assessment/Plan: Plan to halve all pain meds and decrease oxy frequency Mobilize with PT - possible dc mon tues   Makael Stein SCOTT 09/05/2015, 9:17 AM

## 2015-09-05 NOTE — NC FL2 (Signed)
[place] LEVEL OF CARE SCREENING TOOL     IDENTIFICATION  Patient Name: Phillip Rocha Birthdate: 1960/12/15 Sex: male Admission Date (Current Location): 09/03/2015  [facility] and [place] Number:  Herbalist and Address:  [facility], [place] [address] [place], [place] [address]      Provider Number: [id number]  Attending Physician Name and Address:  Jessy Oto, MD  Relative Name and Phone Number:       Current Level of Care: Hospital Recommended Level of Care: [place] Prior Approval Number:    Date Approved/Denied:   PASRR Number:   [id number]  Discharge Plan: SNF    Current Diagnoses: Patient Active Problem List   Diagnosis Date Noted  . Neurogenic claudication due to lumbar spinal stenosis 09/03/2015  . Carpal tunnel syndrome, left 05/24/2015    Class: Chronic  . Trigger thumb of left hand 05/24/2015    Class: Chronic  . Acute sinusitis 05/05/2015  . Coughing 05/05/2015  . Allergic rhinitis 05/05/2015  . E. coli pneumonia ([place]) 11/23/2014  . Acute respiratory failure ([place])   . Loosening of hardware in spine ([place]) 11/17/2014  . Pseudarthrosis after fusion or arthrodesis 11/17/2014    Class: Chronic  . Status post lumbar surgery 11/17/2014  . Lumbar back pain with radiculopathy affecting left lower extremity 11/17/2014  . Allergic conjunctivitis and rhinitis 10/24/2014  . Sleep apnea 10/24/2014  . Lymphadenopathy 08/25/2014  . Arthritis of knee 08/21/2013  . Postoperative anemia due to acute blood loss 03/17/2013    Class: Acute  . Acute blood loss anemia 03/17/2013  . Ileus ([place]) 03/13/2013  . Dehydration 03/13/2013  . ATN (acute tubular necrosis) ([place]) 03/13/2013  . Acute respiratory failure with hypoxia ([place]) 03/13/2013  . Leukocytosis 03/13/2013  . Acute renal failure ([place]) 03/12/2013  . Spinal stenosis, lumbar region, with neurogenic claudication 03/11/2013    Class: Chronic  .  Spondylolisthesis at L3-L4 level 03/11/2013    Class: Chronic  . Spondylolisthesis at L4-L5 level 03/11/2013    Class: Chronic  . Knee pain, acute 11/11/2012  . GERD (gastroesophageal reflux disease) 10/20/2012  . Gout 10/20/2012  . Depression 10/20/2012  . Hypertension 10/20/2012  . Muscle spasm 10/20/2012  . Hyperlipidemia 10/20/2012  . Diabetes mellitus, type 2 ([place]) 10/20/2012  . Osteoarthritis of right knee S/P Total knee arthroplasty 10/20/2012    Orientation RESPIRATION BLADDER Height & Weight     Self, Time, Situation, Place  O2 (2l/min) Continent Weight: (!) 370 lb (167.8 kg) Height:     BEHAVIORAL SYMPTOMS/MOOD NEUROLOGICAL BOWEL NUTRITION STATUS   (None)  (None) Continent Diet (Carb modified)  AMBULATORY STATUS COMMUNICATION OF NEEDS Skin   Extensive Assist Verbally Surgical wounds                       Personal Care Assistance Level of Assistance  Bathing, Feeding, Dressing Bathing Assistance: Limited assistance Feeding assistance: Independent Dressing Assistance: Limited assistance     Functional Limitations Info  Sight, Hearing, Speech Sight Info: Adequate Hearing Info: Adequate Speech Info: Adequate    SPECIAL CARE FACTORS FREQUENCY  PT (By licensed PT)     PT Frequency:  (5x/week)              Contractures Contractures Info: Not present    Additional Factors Info  Code Status, Allergies Code Status Info:  (full) Allergies Info:  (NKDA)  Current Medications (09/05/2015):  This is the current hospital active medication list Current Facility-Administered Medications  Medication Dose Route Frequency Provider Last Rate Last Dose  . 0.9 %  sodium chloride infusion  250 mL Intravenous Continuous Jessy Oto, MD      . 0.9 % NaCl with KCl 40 mEq / L  infusion   Intravenous Continuous Jessy Oto, MD 100 mL/hr at 09/04/15 0444 100 mL/hr at 09/04/15 0444  . acetaminophen (TYLENOL) tablet 650 mg  650 mg Oral Q4H PRN Jessy Oto, MD       Or  . acetaminophen (TYLENOL) suppository 650 mg  650 mg Rectal Q4H PRN Jessy Oto, MD      . allopurinol (ZYLOPRIM) tablet 100 mg  100 mg Oral BID Jessy Oto, MD   100 mg at 09/05/15 1010  . alum & mag hydroxide-simeth (MAALOX/MYLANTA) 200-200-20 MG/5ML suspension 30 mL  30 mL Oral Q6H PRN Jessy Oto, MD      . amLODipine (NORVASC) tablet 10 mg  10 mg Oral Daily Jessy Oto, MD   10 mg at 09/05/15 1010  . fluticasone (FLONASE) 50 MCG/ACT nasal spray 2 spray  2 spray Each Nare BID Jessy Oto, MD       And  . azelastine (ASTELIN) 0.1 % nasal spray 2 spray  2 spray Each Nare BID Jessy Oto, MD      . bisacodyl (DULCOLAX) EC tablet 5 mg  5 mg Oral Daily PRN Jessy Oto, MD   5 mg at 09/05/15 1010  . cetirizine (ZYRTEC) tablet 10 mg  10 mg Oral QPM Jessy Oto, MD   10 mg at 09/04/15 1811  . docusate sodium (COLACE) capsule 100 mg  100 mg Oral BID Jessy Oto, MD   100 mg at 09/05/15 1010  . famotidine (PEPCID) tablet 20 mg  20 mg Oral BID Jessy Oto, MD   20 mg at 09/05/15 1009  . folic acid (FOLVITE) tablet 2 mg  2 mg Oral Daily Jessy Oto, MD   2 mg at 09/05/15 1009  . furosemide (LASIX) tablet 40 mg  40 mg Oral Daily Jessy Oto, MD   40 mg at 09/05/15 1010  . gabapentin (NEURONTIN) tablet 1,200 mg  1,200 mg Oral QID Jessy Oto, MD   1,200 mg at 09/05/15 1010  . hydrOXYzine (ATARAX/VISTARIL) tablet 25 mg  25 mg Oral TID PRN Meredith Pel, MD      . insulin aspart (novoLOG) injection 0-20 Units  0-20 Units Subcutaneous Q4H Jessy Oto, MD   3 Units at 09/05/15 [phone]  . LORazepam (ATIVAN) tablet 1 mg  1 mg Oral BID PRN Jessy Oto, MD      . losartan (COZAAR) tablet 50 mg  50 mg Oral Daily Jessy Oto, MD   50 mg at 09/05/15 1010  . menthol-cetylpyridinium (CEPACOL) lozenge 3 mg  1 lozenge Oral PRN Jessy Oto, MD       Or  . phenol (CHLORASEPTIC) mouth spray 1 spray  1 spray Mouth/Throat PRN Jessy Oto, MD      . methocarbamol  (ROBAXIN) tablet 500 mg  500 mg Oral Q6H PRN Jessy Oto, MD      . montelukast (SINGULAIR) tablet 10 mg  10 mg Oral QHS Jessy Oto, MD   10 mg at 09/04/15 2239  . morphine (MS CONTIN) 12 hr tablet 15 mg  15 mg Oral Q12H Meredith Pel, MD   15 mg at 09/05/15 1010  . morphine 2 MG/ML injection 1 mg  1 mg Intravenous Q3H PRN Meredith Pel, MD      . ondansetron [facility]) injection 4 mg  4 mg Intravenous Q4H PRN Jessy Oto, MD      . oxyCODONE (Oxy IR/ROXICODONE) immediate release tablet 10 mg  10 mg Oral Q6H PRN Meredith Pel, MD      . oxyCODONE-acetaminophen (PERCOCET/ROXICET) 5-325 MG per tablet 1-2 tablet  1-2 tablet Oral Q6H PRN Meredith Pel, MD      . pantoprazole (PROTONIX) EC tablet 40 mg  40 mg Oral Daily Jessy Oto, MD   40 mg at 09/05/15 1010  . polyethylene glycol (MIRALAX / GLYCOLAX) packet 17 g  17 g Oral Daily PRN Jessy Oto, MD   17 g at 09/05/15 1010  . sertraline (ZOLOFT) tablet 25 mg  25 mg Oral BID Jessy Oto, MD   25 mg at 09/05/15 1009  . sodium chloride flush (NS) 0.9 % injection 3 mL  3 mL Intravenous Q12H Jessy Oto, MD   3 mL at 09/04/15 2238  . sodium chloride flush (NS) 0.9 % injection 3 mL  3 mL Intravenous PRN Jessy Oto, MD      . sucralfate (CARAFATE) tablet 1 g  1 g Oral TID WC & HS Jessy Oto, MD   1 g at 09/05/15 6295  . temazepam (RESTORIL) capsule 15 mg  15 mg Oral QHS Jessy Oto, MD   15 mg at 09/04/15 2239  . tiZANidine (ZANAFLEX) tablet 4 mg  4 mg Oral Q8H PRN Meredith Pel, MD      . topiramate (TOPAMAX) tablet 50 mg  50 mg Oral BID Jessy Oto, MD   50 mg at 09/05/15 1010  . traMADol (ULTRAM) tablet 100 mg  100 mg Oral Q6H PRN Jessy Oto, MD      . triamcinolone ointment (KENALOG) 0.1 % 1 application  1 application Topical BID Jessy Oto, MD   1 application at 28/41/32 1011     Discharge Medications: Please see discharge summary for a list of discharge medications.  Relevant Imaging  Results:  Relevant Lab Results:   Additional Information SS#: [id number]  Junie Spencer, LCSW

## 2015-09-06 ENCOUNTER — Encounter (HOSPITAL_COMMUNITY): Payer: Self-pay | Admitting: General Practice

## 2015-09-06 LAB — GLUCOSE, CAPILLARY
GLUCOSE-CAPILLARY: 173 mg/dL — AB (ref 65–99)
GLUCOSE-CAPILLARY: 183 mg/dL — AB (ref 65–99)
Glucose-Capillary: 123 mg/dL — ABNORMAL HIGH (ref 65–99)
Glucose-Capillary: 153 mg/dL — ABNORMAL HIGH (ref 65–99)

## 2015-09-06 NOTE — Clinical Social Work Note (Signed)
Patient to be discharged to [facility]. Patient to be transported via EMS. RN report number: [place], [facility]: [phone] Surgical: [phone]

## 2015-09-06 NOTE — Discharge Summary (Signed)
Physician Discharge Summary      Patient ID: ADOLF ORMISTON MRN: [id number] DOB/AGE: 06-17-1960 55 y.o.  Admit date: 09/03/2015 Discharge date:   Admission Diagnoses:  Active Problems:   Neurogenic claudication due to lumbar spinal stenosis   Discharge Diagnoses:  Same  Past Medical History:  Diagnosis Date  . Anemia   . Anxiety   . Arthritis   . Carpal tunnel syndrome of left wrist    left trigger thumb  . Chronic low back pain   . Claustrophobia   . Cough with sputum    yellow color  . Degenerative arthritis    knees and spine  . Diabetes mellitus without complication (HCC)    NIDDM x 2 years  . GERD (gastroesophageal reflux disease)   . Headache   . Hypertension   . Lymphadenopathy 08/25/2014  . Morbid obesity ([place])   . Opioid dependence in controlled environment ([place])   . Psoriasis   . Psoriatic arthritis ([place])    takes Humira every 2 weeks  . Shortness of breath dyspnea     Surgeries: Procedure(s): BILATERAL PARTIAL HEMILAMINECTOMIES L1-2 AND L2-3 on 09/03/2015   Consultants:   Discharged Condition: Improved  Hospital Course: PAUL TRETTIN is an 55 y.o. male who was admitted 09/03/2015 with a chief complaint of No chief complaint on file. , and found to have a diagnosis of <principal problem not specified>.  He was brought to the operating room on 09/03/2015 and underwent the above named procedures.    He was given perioperative antibiotics:  Anti-infectives    Start     Dose/Rate Route Frequency Ordered Stop   09/03/15 2100  ceFAZolin (ANCEF) IVPB 2g/100 mL premix     2 g 200 mL/hr over 30 Minutes Intravenous Every 8 hours 09/03/15 1639 09/04/15 0514   09/03/15 0600  ceFAZolin (ANCEF) 3 g in dextrose 5 % 50 mL IVPB     3 g 130 mL/hr over 30 Minutes Intravenous On call to O.R. 09/02/15 1350 09/03/15 1302    Due to OSA he was placed on CPAP and catheter discontinued on POD#1. He was able to void and PT and OT intitiated. POD#2 awake alert and  oriented. PT and OT noted significant dependencies and inability to perform ADLS. Recommended for SNF placement vs maximum HHN services including 24 hour assistance. The patient lives alone. He is planning to move to [place] in October. POD#3 dressing changed incision is dry with no fluctuance. Discussed with this patient SNF and he would prefer [facility]. A social service consult requested. Will need PT and OT for a  Period of 2 weeks to improve independence and abiltily to perforn ADLs including food preparation, ambulation and self care and hygiene.  He was given sequential compression devices and early ambulation for DVT prophylaxis.  He benefited maximally from their hospital stay and there were no complications.    Recent vital signs:  Vitals:   09/05/15 2017 09/06/15 0500  BP: 108/68 114/77  Pulse: 83 71  Resp: 16 16  Temp: 98.5 F (36.9 C) 98.3 F (36.8 C)    Recent laboratory studies:  Results for orders placed or performed during the hospital encounter of 09/03/15  Glucose, capillary  Result Value Ref Range   Glucose-Capillary 122 (H) 65 - 99 mg/dL   Comment 1 Notify RN    Comment 2 Document in Chart   CBC  Result Value Ref Range   WBC 14.6 (H) 4.0 - 10.5 K/uL   RBC  4.93 4.22 - 5.81 MIL/uL   Hemoglobin 11.6 (L) 13.0 - 17.0 g/dL   HCT 38.0 (L) 39.0 - 52.0 %   MCV 77.1 (L) 78.0 - 100.0 fL   MCH 23.5 (L) 26.0 - 34.0 pg   MCHC 30.5 30.0 - 36.0 g/dL   RDW 20.3 (H) 11.5 - 15.5 %   Platelets 221 150 - 400 K/uL  Comprehensive metabolic panel  Result Value Ref Range   Sodium 135 135 - 145 mmol/L   Potassium 4.7 3.5 - 5.1 mmol/L   Chloride 103 101 - 111 mmol/L   CO2 19 (L) 22 - 32 mmol/L   Glucose, Bld 195 (H) 65 - 99 mg/dL   BUN 14 6 - 20 mg/dL   Creatinine, Ser 1.33 (H) 0.61 - 1.24 mg/dL   Calcium 8.2 (L) 8.9 - 10.3 mg/dL   Total Protein 7.2 6.5 - 8.1 g/dL   Albumin 3.7 3.5 - 5.0 g/dL   AST 24 15 - 41 U/L   ALT 15 (L) 17 - 63 U/L   Alkaline Phosphatase 97 38  - 126 U/L   Total Bilirubin 0.4 0.3 - 1.2 mg/dL   GFR calc non Af Amer 58 (L) >60 mL/min   GFR calc Af Amer >60 >60 mL/min   Anion gap 13 5 - 15  Hemoglobin A1c  Result Value Ref Range   Hgb A1c MFr Bld 7.1 (H) 4.8 - 5.6 %   Mean Plasma Glucose 157 mg/dL  Glucose, capillary  Result Value Ref Range   Glucose-Capillary 115 (H) 65 - 99 mg/dL  Glucose, capillary  Result Value Ref Range   Glucose-Capillary 154 (H) 65 - 99 mg/dL  Glucose, capillary  Result Value Ref Range   Glucose-Capillary 214 (H) 65 - 99 mg/dL  Glucose, capillary  Result Value Ref Range   Glucose-Capillary 185 (H) 65 - 99 mg/dL   Comment 1 Notify RN   Glucose, capillary  Result Value Ref Range   Glucose-Capillary 151 (H) 65 - 99 mg/dL   Comment 1 Notify RN   Glucose, capillary  Result Value Ref Range   Glucose-Capillary 160 (H) 65 - 99 mg/dL   Comment 1 Notify RN   Glucose, capillary  Result Value Ref Range   Glucose-Capillary 159 (H) 65 - 99 mg/dL  Glucose, capillary  Result Value Ref Range   Glucose-Capillary 153 (H) 65 - 99 mg/dL  Glucose, capillary  Result Value Ref Range   Glucose-Capillary 167 (H) 65 - 99 mg/dL  Glucose, capillary  Result Value Ref Range   Glucose-Capillary 140 (H) 65 - 99 mg/dL   Comment 1 Notify RN   Glucose, capillary  Result Value Ref Range   Glucose-Capillary 148 (H) 65 - 99 mg/dL   Comment 1 Notify RN   Glucose, capillary  Result Value Ref Range   Glucose-Capillary 131 (H) 65 - 99 mg/dL   Comment 1 Notify RN   Glucose, capillary  Result Value Ref Range   Glucose-Capillary 204 (H) 65 - 99 mg/dL  Glucose, capillary  Result Value Ref Range   Glucose-Capillary 153 (H) 65 - 99 mg/dL  Glucose, capillary  Result Value Ref Range   Glucose-Capillary 173 (H) 65 - 99 mg/dL    Discharge Medications:     Medication List    TAKE these medications   allopurinol 100 MG tablet Commonly known as:  ZYLOPRIM Take 100 mg by mouth 2 (two) times daily.   amLODipine 10 MG  tablet Commonly known as:  NORVASC Take  10 mg by mouth daily.   COSENTYX SENSOREADY PEN 150 MG/ML [place] Generic drug:  Secukinumab Inject 300 mg into the skin every 30 (thirty) days. On or about the 20th of each month   DYMISTA 137-50 MCG/ACT Susp Generic drug:  Azelastine-Fluticasone Place 2 sprays into the nose 2 (two) times daily.   ferrous sulfate 325 (65 FE) MG tablet Take 325 mg by mouth daily with breakfast.   folic acid 1 MG tablet Commonly known as:  FOLVITE Take 2 mg by mouth daily. Pt must take daily while using  Methotrexate   furosemide 40 MG tablet Commonly known as:  LASIX Take 40 mg by mouth daily.   gabapentin 600 MG tablet Commonly known as:  NEURONTIN Take 1,200 mg by mouth 4 (four) times daily.   hydrOXYzine 50 MG tablet Commonly known as:  ATARAX/VISTARIL Take 2 tablets (100 mg total) by mouth 3 (three) times daily as needed for anxiety or itching (itching). What changed:  how much to take   levocetirizine 5 MG tablet Commonly known as:  XYZAL Take 5 mg by mouth every evening.   LORazepam 1 MG tablet Commonly known as:  ATIVAN Take 1 mg by mouth 2 (two) times daily as needed for anxiety.   losartan 50 MG tablet Commonly known as:  COZAAR Take 50 mg by mouth daily.   metFORMIN 1000 MG tablet Commonly known as:  GLUCOPHAGE Take 1,000 mg by mouth 2 (two) times daily with a meal.   methotrexate 2.5 MG tablet Take 20 mg by mouth once a week. Wednesday Mornings   montelukast 10 MG tablet Commonly known as:  SINGULAIR Take 1 tablet (10 mg total) by mouth at bedtime.   morphine 30 MG 12 hr tablet Commonly known as:  MS CONTIN Take 1 tablet (30 mg total) by mouth every 12 (twelve) hours.   omeprazole 40 MG capsule Commonly known as:  PRILOSEC Take 40 mg by mouth daily.   Oxycodone HCl 20 MG Tabs Take 1 tablet (20 mg total) by mouth 4 (four) times daily.   sertraline 25 MG tablet Commonly known as:  ZOLOFT Take 25 mg by mouth 2 (two) times  daily.   sucralfate 1 g tablet Commonly known as:  CARAFATE Take 1 g by mouth 4 (four) times daily -  with meals and at bedtime.   temazepam 15 MG capsule Commonly known as:  RESTORIL Take 15 mg by mouth at bedtime.   tiZANidine 4 MG tablet Commonly known as:  ZANAFLEX Take 4 mg by mouth 3 (three) times daily.   topiramate 50 MG tablet Commonly known as:  TOPAMAX Take 1 tablet (50 mg total) by mouth 2 (two) times daily. What changed:  when to take this  reasons to take this   traMADol 50 MG tablet Commonly known as:  ULTRAM Take 1 tablet (50 mg total) by mouth every 6 (six) hours as needed for moderate pain. What changed:  reasons to take this   triamcinolone ointment 0.1 % Commonly known as:  KENALOG Apply 1 application topically 2 (two) times daily.       Diagnostic Studies: Dg Chest 2 View  Result Date: 08/31/2015 CLINICAL DATA:  Preop for lumbar spine surgery, bilateral leg weakness EXAM: CHEST  2 VIEW COMPARISON:  Chest x-ray of 06/08/2015 FINDINGS: Prominent linear markings at the lung bases are consistent with basilar fibrosis and are stable. No infiltrate or effusion is seen. Mediastinal and hilar contours are unremarkable. The heart is within normal limits  in size. There are degenerative changes in the lower thoracic spine and within the right [facility] joint. IMPRESSION: Stable bibasilar fibrosis.  No active lung disease. Electronically Signed   By: Ivar Drape M.D.   On: 08/31/2015 09:17   Dg Lumbar Spine 1 View  Result Date: 09/03/2015 CLINICAL DATA:  Hemilaminectomies of L1-2 and L2-3. EXAM: LUMBAR SPINE - 1 VIEW COMPARISON:  CT scan of July 19, 2015. FINDINGS: Single cross-table lateral intraoperative projection of the lumbar spine was obtained. Patient is status post surgical posterior fusion of L3 through S2 with bilateral intrapedicular screw placement. Surgical probes directed toward the posterior elements of L1 and L2. IMPRESSION: Surgical localization as described  above. Electronically Signed   By: Marijo Conception, M.D.   On: 09/03/2015 14:18    Disposition: 01-Home or Self Care  Discharge Instructions    Call MD / Call 911    Complete by:  As directed   If you experience chest pain or shortness of breath, CALL 911 and be transported to the hospital emergency room.  If you develope a fever above 101 F, pus (white drainage) or increased drainage or redness at the wound, or calf pain, call your surgeon's office.   Constipation Prevention    Complete by:  As directed   Drink plenty of fluids.  Prune juice may be helpful.  You may use a stool softener, such as Colace (over the counter) 100 mg twice a day.  Use MiraLax (over the counter) for constipation as needed.   Diet - low sodium heart healthy    Complete by:  As directed   Discharge instructions    Complete by:  As directed   No lifting greater than 10 lbs. Avoid bending, stooping and twisting. Walk in house for first week them may start to get out slowly increasing distance up to one quarter mile by 3 weeks post op. Keep incision dry for 3 days, may use tegaderm or similar water impervious dressing.   Driving restrictions    Complete by:  As directed   No driving for 8 weeks   Increase activity slowly as tolerated    Complete by:  As directed   Lifting restrictions    Complete by:  As directed   No lifting for 8 weeks      Follow-up Information    Lajoy Vanamburg E, MD In 2 weeks.   Specialty:  Orthopedic Surgery Contact information: [place] [address] [phone]            Signed: Jessy Oto 09/06/2015, 7:52 AM

## 2015-09-06 NOTE — Progress Notes (Signed)
Physical Therapy Treatment Patient Details Name: Phillip Rocha MRN: [id number] DOB: [birth year] Today's Date: 09/06/2015    History of Present Illness 55 y.o. male s/p bilateral partial L1-2 and L2-3 hemilaminectomies. PMH significant for HTN, anemia, DM type II, psoriatic arthritis, dyspnea, L TKA (2015), back surgery (x2), and R rotator cuff surgery.    PT Comments    Pt presented sitting upright in recliner, awake and ready to participate in therapy session. Pt expressed his desire to d/c today. Pt performed stair training during session and required min guard for safety only. PT reviewed spine precautions with pt during session as well. Pt would continue to benefit from skilled physical therapy services at this time while admitted and after d/c to address his limitations in order to improve his overall safety and independence with functional mobility.    Follow Up Recommendations  SNF     Equipment Recommendations  Other (comment) (bariatric RW)    Recommendations for Other Services       Precautions / Restrictions Precautions Precautions: Back;Fall Precaution Booklet Issued: Yes (comment) Precaution Comments: Educated pt on all back precautions and reviewed handout Restrictions Weight Bearing Restrictions: No    Mobility  Bed Mobility               General bed mobility comments: Pt was sitting upright in recliner upon PT entering room  Transfers Overall transfer level: Needs assistance Equipment used: Rolling walker (2 wheeled) Transfers: Sit to/from Stand Sit to Stand: Min guard         General transfer comment: close min guard assist for safety and to stabilize RW during transfer. VCs for safe hand placement and to adhere to back precautions.  Ambulation/Gait Ambulation/Gait assistance: Min guard Ambulation Distance (Feet): 2 Feet Assistive device: Rolling walker (2 wheeled) Gait Pattern/deviations: Step-through pattern;Decreased stride length;Trunk  flexed (flexed more at hips) Gait velocity: decreased Gait velocity interpretation: Below normal speed for age/gender     Stairs Stairs: Yes Stairs assistance: Min guard Stair Management: Two rails;Step to pattern (L LE leading to ascend, R LE leading to descend) Number of Stairs: 2 General stair comments: Pt ascended with L LE leading and descended with R LE leading  Wheelchair Mobility    Modified Rankin (Stroke Patients Only)       Balance Overall balance assessment: Needs assistance Sitting-balance support: Feet supported;No upper extremity supported Sitting balance-Leahy Scale: Good     Standing balance support: Bilateral upper extremity supported;During functional activity Standing balance-Leahy Scale: Poor                      Cognition Arousal/Alertness: Awake/alert Behavior During Therapy: WFL for tasks assessed/performed Overall Cognitive Status: Within Functional Limits for tasks assessed                      Exercises      General Comments        Pertinent Vitals/Pain Pain Assessment: 0-10 Pain Score: 8  Pain Location: R LE and back Pain Descriptors / Indicators: Discomfort Pain Intervention(s): Limited activity within patient's tolerance;Monitored during session;Repositioned    Home Living                      Prior Function            PT Goals (current goals can now be found in the care plan section) Acute Rehab PT Goals Patient Stated Goal: to return home PT Goal Formulation: With patient  Time For Goal Achievement: 09/11/15 Potential to Achieve Goals: Good Progress towards PT goals: Progressing toward goals    Frequency  Min 5X/week    PT Plan Current plan remains appropriate    Co-evaluation             End of Session Equipment Utilized During Treatment: Gait belt Activity Tolerance: Patient limited by pain Patient left: in chair;with call bell/phone within reach     Time: 1033-1055 PT Time  Calculation (min) (ACUTE ONLY): 22 min  Charges:  $Gait Training: 8-22 mins                    G CodesClearnce Sorrel Kobi Rocha 2015-09-20, 11:01 AM Phillip Rocha, Phillip Rocha, DPT [phone]

## 2015-09-06 NOTE — Care Management Important Message (Signed)
Important Message  Patient Details  Name: Phillip Rocha MRN: [id number] Date of Birth: 1960/02/17   Medicare Important Message Given:  Yes    Loann Quill 09/06/2015, 9:06 AM

## 2015-09-06 NOTE — Progress Notes (Signed)
Subjective: 3 Days Post-Op Procedure(s) (LRB): BILATERAL PARTIAL HEMILAMINECTOMIES L1-2 AND L2-3 (N/A) Awake, alert and oriented x 4. Voided and has had a BM. Tolerating pos And po narcotics. No assistance at home. PT and OT eval shows need for  Max assistance at home vs SNF. SNF is recommended. Patient would prefer [facility]. Patient reports pain as moderate.    Objective:   VITALS:  Temp:  [98.3 F (36.8 C)-98.7 F (37.1 C)] 98.3 F (36.8 C) (07/24 0500) Pulse Rate:  [71-92] 71 (07/24 0500) Resp:  [16] 16 (07/24 0500) BP: (108-122)/(68-77) 114/77 (07/24 0500) SpO2:  [93 %-98 %] 93 % (07/24 0500)  Neurologically intact ABD soft Neurovascular intact Sensation intact distally Intact pulses distally Dorsiflexion/Plantar flexion intact Incision: scant drainage No cellulitis present no fluctuance, right leg with baseline fused ankle and motor deficits due to old peripheral nerve sequelae   LABS  Recent Labs  09/04/15 0414  HGB 11.6*  WBC 14.6*  PLT 221    Recent Labs  09/04/15 0414  NA 135  K 4.7  CL 103  CO2 19*  BUN 14  CREATININE 1.33*  GLUCOSE 195*   No results for input(s): LABPT, INR in the last 72 hours.   Assessment/Plan: 3 Days Post-Op Procedure(s) (LRB): BILATERAL PARTIAL HEMILAMINECTOMIES L1-2 AND L2-3 (N/A)  Advance diet Up with therapy Discharge to SNF  NITKA,JAMES E 09/06/2015, 7:47 AM

## 2015-09-06 NOTE — Clinical Social Work Placement (Signed)
CLINICAL SOCIAL WORK PLACEMENT  NOTE  Date:  09/06/2015  Patient Details  Name: Phillip Rocha MRN: [id number] Date of Birth: [birth year]  Clinical Social Work is seeking post-discharge placement for this patient at the Section level of care (*CSW will initial, date and re-position this form in  chart as items are completed):  Yes   Patient/family provided with [facility] Work Department's list of facilities offering this level of care within the geographic area requested by the patient (or if unable, by the patient's family).  Yes   Patient/family informed of their freedom to choose among providers that offer the needed level of care, that participate in Medicare, Medicaid or managed care program needed by the patient, have an available bed and are willing to accept the patient.  Yes   Patient/family informed of [facility]'s ownership interest in [facility] and [facility], as well as of the fact that they are under no obligation to receive care at these facilities.  PASRR submitted to EDS on       PASRR number received on       Existing PASRR number confirmed on 09/05/15     FL2 transmitted to all facilities in geographic area requested by pt/family on 09/05/15     FL2 transmitted to all facilities within larger geographic area on       Patient informed that his/her managed care company has contracts with or will negotiate with certain facilities, including the following:        Yes   Patient/family informed of bed offers received.  Patient chooses bed at [facility]     Physician recommends and patient chooses bed at      Patient to be transferred to [facility] on 09/06/15.  Patient to be transferred to facility by [facility]     Patient family notified on 09/06/15 of transfer.  Name of family member notified:  Patient     PHYSICIAN Please prepare priority discharge summary, including medications, Please  prepare prescriptions, Please sign FL2     Additional Comment:    _______________________________________________ Caroline Sauger, LCSW 09/06/2015, 11:22 AM

## 2015-09-20 ENCOUNTER — Ambulatory Visit (INDEPENDENT_AMBULATORY_CARE_PROVIDER_SITE_OTHER): Payer: Medicare Other | Admitting: *Deleted

## 2015-09-20 DIAGNOSIS — J309 Allergic rhinitis, unspecified: Secondary | ICD-10-CM

## 2015-09-23 ENCOUNTER — Ambulatory Visit (INDEPENDENT_AMBULATORY_CARE_PROVIDER_SITE_OTHER): Payer: Medicare Other | Admitting: *Deleted

## 2015-09-23 DIAGNOSIS — J309 Allergic rhinitis, unspecified: Secondary | ICD-10-CM

## 2015-09-27 ENCOUNTER — Other Ambulatory Visit: Payer: Self-pay | Admitting: *Deleted

## 2015-09-27 ENCOUNTER — Telehealth: Payer: Self-pay | Admitting: *Deleted

## 2015-09-27 MED ORDER — MONTELUKAST SODIUM 10 MG PO TABS: 10.0000 mg | ORAL_TABLET | Freq: Every day | ORAL | 0 refills | Status: AC

## 2015-09-27 NOTE — Telephone Encounter (Signed)
cxl

## 2015-09-28 ENCOUNTER — Ambulatory Visit (INDEPENDENT_AMBULATORY_CARE_PROVIDER_SITE_OTHER): Payer: Medicare Other | Admitting: *Deleted

## 2015-09-28 DIAGNOSIS — J309 Allergic rhinitis, unspecified: Secondary | ICD-10-CM

## 2015-10-01 ENCOUNTER — Ambulatory Visit (INDEPENDENT_AMBULATORY_CARE_PROVIDER_SITE_OTHER): Payer: Medicare Other

## 2015-10-01 DIAGNOSIS — J309 Allergic rhinitis, unspecified: Secondary | ICD-10-CM

## 2015-10-05 ENCOUNTER — Ambulatory Visit (INDEPENDENT_AMBULATORY_CARE_PROVIDER_SITE_OTHER): Payer: Medicare Other

## 2015-10-05 ENCOUNTER — Ambulatory Visit: Payer: Medicare Other | Admitting: Podiatry

## 2015-10-05 DIAGNOSIS — J309 Allergic rhinitis, unspecified: Secondary | ICD-10-CM | POA: Diagnosis not present

## 2015-10-06 ENCOUNTER — Ambulatory Visit (INDEPENDENT_AMBULATORY_CARE_PROVIDER_SITE_OTHER): Payer: Medicare Other | Admitting: Podiatry

## 2015-10-06 ENCOUNTER — Encounter: Payer: Self-pay | Admitting: Podiatry

## 2015-10-06 DIAGNOSIS — B351 Tinea unguium: Secondary | ICD-10-CM | POA: Diagnosis not present

## 2015-10-06 DIAGNOSIS — M79676 Pain in unspecified toe(s): Secondary | ICD-10-CM

## 2015-10-06 NOTE — Patient Instructions (Signed)
Diabetes and Foot Care Diabetes may cause you to have problems because of poor blood supply (circulation) to your feet and legs. This may cause the skin on your feet to become thinner, break easier, and heal more slowly. Your skin may become dry, and the skin may peel and crack. You may also have nerve damage in your legs and feet causing decreased feeling in them. You may not notice minor injuries to your feet that could lead to infections or more serious problems. Taking care of your feet is one of the most important things you can do for yourself.  HOME CARE INSTRUCTIONS  Wear shoes at all times, even in the house. Do not go barefoot. Bare feet are easily injured.  Check your feet daily for blisters, cuts, and redness. If you cannot see the bottom of your feet, use a mirror or ask someone for help.  Wash your feet with warm water (do not use hot water) and mild soap. Then pat your feet and the areas between your toes until they are completely dry. Do not soak your feet as this can dry your skin.  Apply a moisturizing lotion or petroleum jelly (that does not contain alcohol and is unscented) to the skin on your feet and to dry, brittle toenails. Do not apply lotion between your toes.  Trim your toenails straight across. Do not dig under them or around the cuticle. File the edges of your nails with an emery board or nail file.  Do not cut corns or calluses or try to remove them with medicine.  Wear clean socks or stockings every day. Make sure they are not too tight. Do not wear knee-high stockings since they may decrease blood flow to your legs.  Wear shoes that fit properly and have enough cushioning. To break in new shoes, wear them for just a few hours a day. This prevents you from injuring your feet. Always look in your shoes before you put them on to be sure there are no objects inside.  Do not cross your legs. This may decrease the blood flow to your feet.  If you find a minor scrape,  cut, or break in the skin on your feet, keep it and the skin around it clean and dry. These areas may be cleansed with mild soap and water. Do not cleanse the area with peroxide, alcohol, or iodine.  When you remove an adhesive bandage, be sure not to damage the skin around it.  If you have a wound, look at it several times a day to make sure it is healing.  Do not use heating pads or hot water bottles. They may burn your skin. If you have lost feeling in your feet or legs, you may not know it is happening until it is too late.  Make sure your health care provider performs a complete foot exam at least annually or more often if you have foot problems. Report any cuts, sores, or bruises to your health care provider immediately. SEEK MEDICAL CARE IF:   You have an injury that is not healing.  You have cuts or breaks in the skin.  You have an ingrown nail.  You notice redness on your legs or feet.  You feel burning or tingling in your legs or feet.  You have pain or cramps in your legs and feet.  Your legs or feet are numb.  Your feet always feel cold. SEEK IMMEDIATE MEDICAL CARE IF:   There is increasing redness,  swelling, or pain in or around a wound.  There is a red line that goes up your leg.  Pus is coming from a wound.  You develop a fever or as directed by your health care provider.  You notice a bad smell coming from an ulcer or wound.   This information is not intended to replace advice given to you by your health care provider. Make sure you discuss any questions you have with your health care provider.   Document Released: 01/28/2000 Document Revised: 10/02/2012 Document Reviewed: 07/09/2012 Elsevier Interactive Patient Education [employer].

## 2015-10-06 NOTE — Progress Notes (Signed)
Patient ID: Phillip Rocha, male   DOB: 07-Dec-1960, 55 y.o.   MRN: [id number]   Subjective: This patient presents again for scheduled plain that his toenails are uncomfortable when wearing shoes and when he transfers from wheelchair and is requesting toenail debridement Patient is diabetic and recovering from recent back surgery  Objective: Patient is transfers from wheelchair to treatment chair Orientated 3 No skin lesions bilaterally The toenails are hypertrophic, elongated, deformed, discolored and tender direct palpation 6-10  Assessment: Symptomatic onychomycoses 6-10 Type II diabetic  Plan: Debridement toenails 6-10 mechanically and electrically without any bleeding  He is moving to [place] and did not request any follow-up at this time

## 2015-10-07 ENCOUNTER — Ambulatory Visit (INDEPENDENT_AMBULATORY_CARE_PROVIDER_SITE_OTHER): Payer: Medicare Other

## 2015-10-07 DIAGNOSIS — J309 Allergic rhinitis, unspecified: Secondary | ICD-10-CM

## 2015-10-27 ENCOUNTER — Ambulatory Visit: Payer: Medicare Other | Admitting: Podiatry

## 2016-01-03 ENCOUNTER — Telehealth: Payer: Self-pay

## 2016-01-03 NOTE — Telephone Encounter (Signed)
Phillip Rocha has moved to [place]. Please call him to see if his new doctor will submit forms for his patient assistance.

## 2016-01-10 NOTE — Telephone Encounter (Signed)
I have called patient to discuss / he has the forms and will make sure his doctor in [place] has them

## 2016-02-10 ENCOUNTER — Telehealth: Payer: Self-pay | Admitting: Rheumatology

## 2016-02-10 NOTE — Telephone Encounter (Signed)
Patient has questions about cosentyx. Please call patient.

## 2016-02-10 NOTE — Telephone Encounter (Signed)
Patient wanted to know if we could share his records with his new rheumatologist. Patient advised that his rheumatologist would need to send [place] a record release request and we would be glad to share them. Patient verbalized understanding.

## 2016-03-20 NOTE — Addendum Note (Signed)
Addended by: Felipa Emory on: 03/20/2016 11:16 AM   Modules accepted: Orders

## 2016-10-03 ENCOUNTER — Telehealth (INDEPENDENT_AMBULATORY_CARE_PROVIDER_SITE_OTHER): Payer: Self-pay | Admitting: Specialist

## 2016-10-03 NOTE — Telephone Encounter (Signed)
I received VM from pt. I called him back [phone].Lives in [place] and needs his records sent to a doctor there as soon as possible,so he can have shoulder surgery . After informing him we need release form, he is going to his PCP to sign release they will fax here for him. He wants his records relating to shld faxed today, which I told him I would do. And he wants the remainder of his recordspts and xray CD mailed.

## 2017-05-02 IMAGING — CR DG CHEST 1V PORT
1 series · 1 of 1 positions shown · non-contrast
Comparison: 11/17/2014 .

CLINICAL DATA: Atelectasis.

EXAM:
PORTABLE CHEST 1 VIEW

[AP]
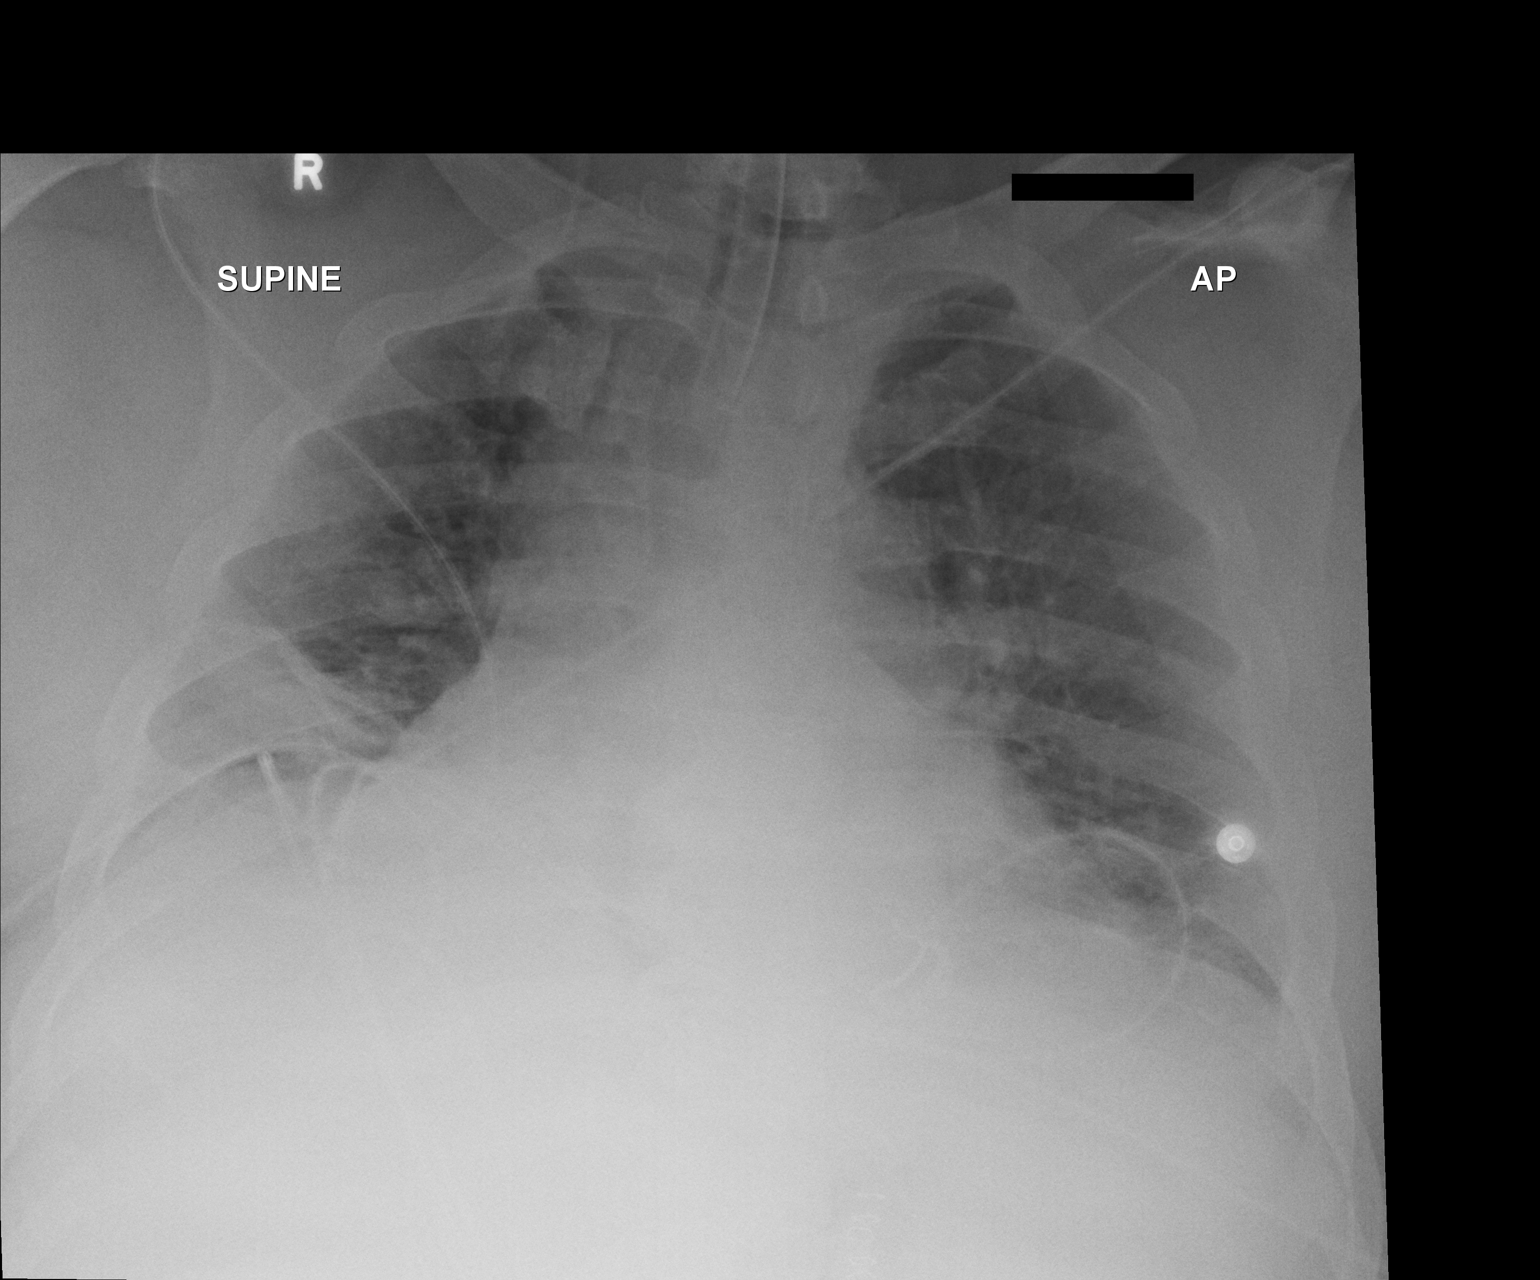

[1 of 1 positions shown; findings below may reference images not displayed]

FINDINGS: Endotracheal tube in stable position. Cardiomegaly. Normal pulmonary
vascularity. Slight improvement of bibasilar atelectasis and/or
infiltrates. No pleural effusion or pneumothorax.
IMPRESSION: 1. Endotracheal tube in good anatomic position.
2. Slight improvement of bibasilar atelectasis and/or infiltrates.
3. Stable cardiomegaly.

## 2017-05-02 IMAGING — US US RENAL
1 series · 14 of 25 positions shown · non-contrast
Comparison: CT of the abdomen and pelvis from 08/18/2014, and renal
ultrasound performed 03/13/2013

CLINICAL DATA: Acute onset of renal failure.  Initial encounter.

EXAM:
RENAL / URINARY TRACT ULTRASOUND COMPLETE

[Series 1: us renal · 0.35mm/px · 14 of 27 slices shown]
[im 1/27]
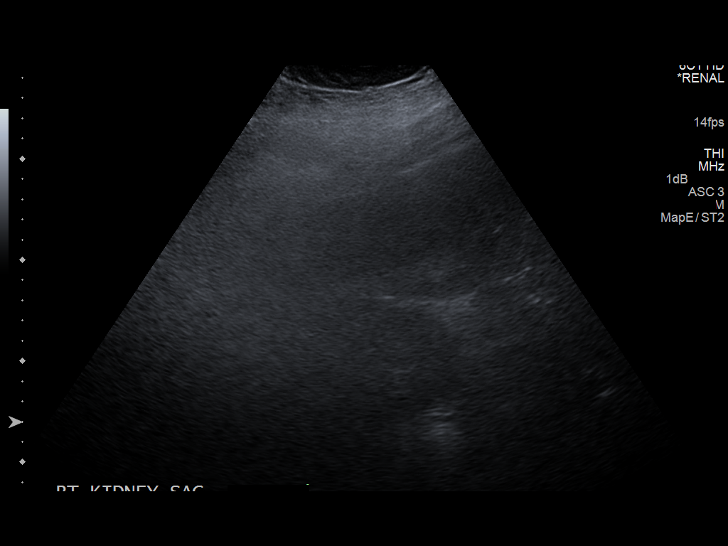
[im 3/27]
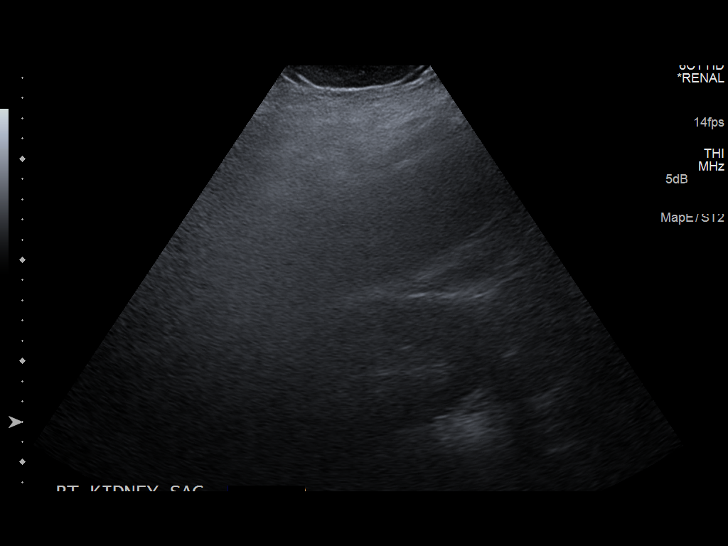
[im 5/27]
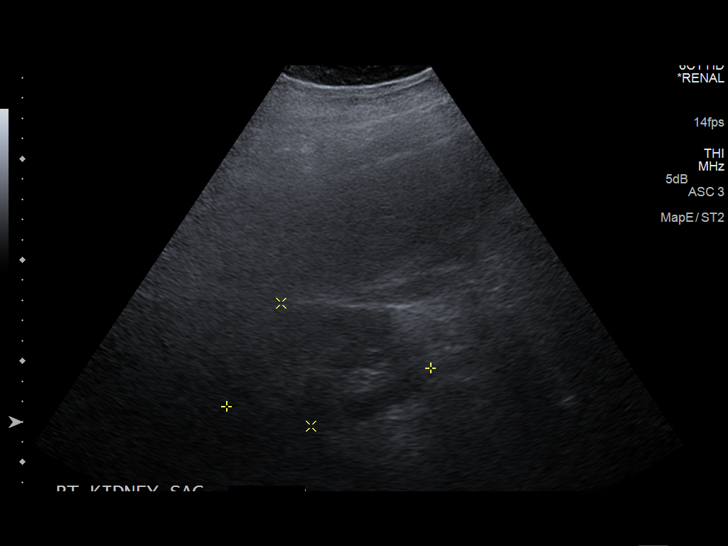
[im 7/27]
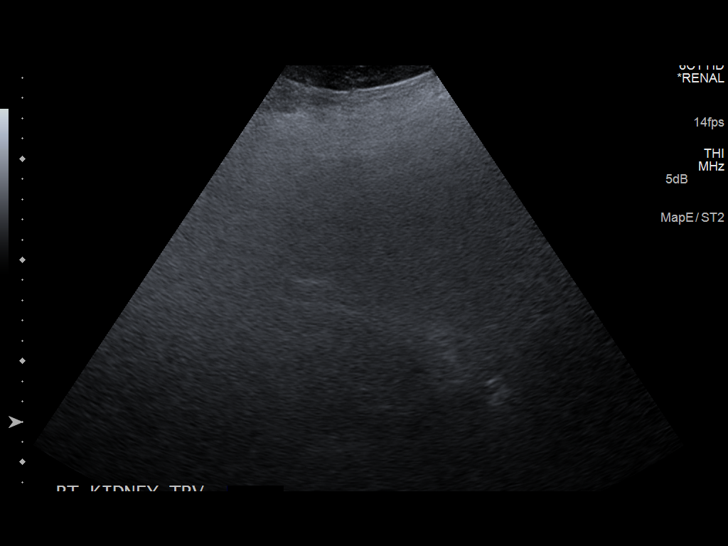
[im 9/27]
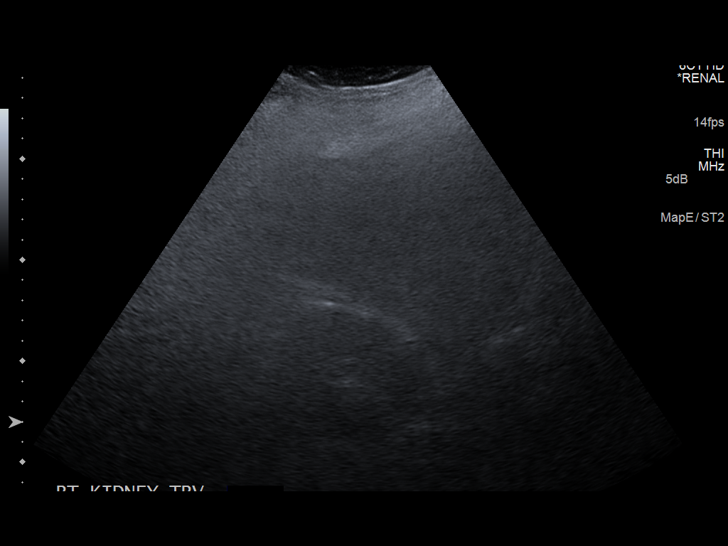
[im 10/27]
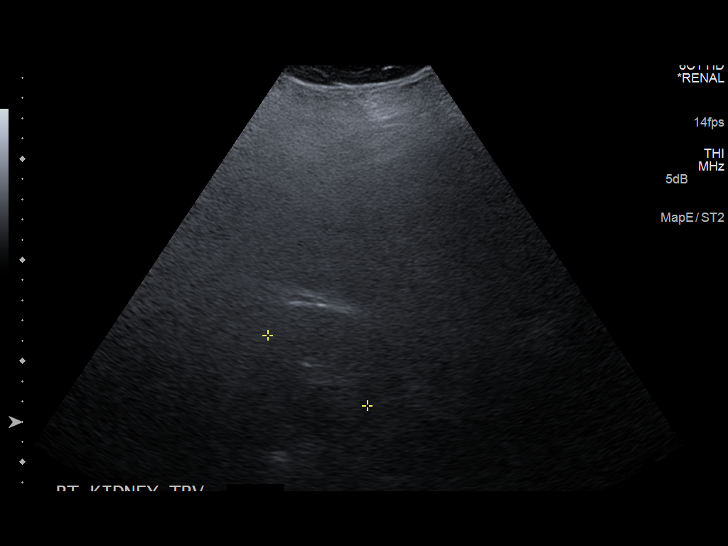
[im 12/27]
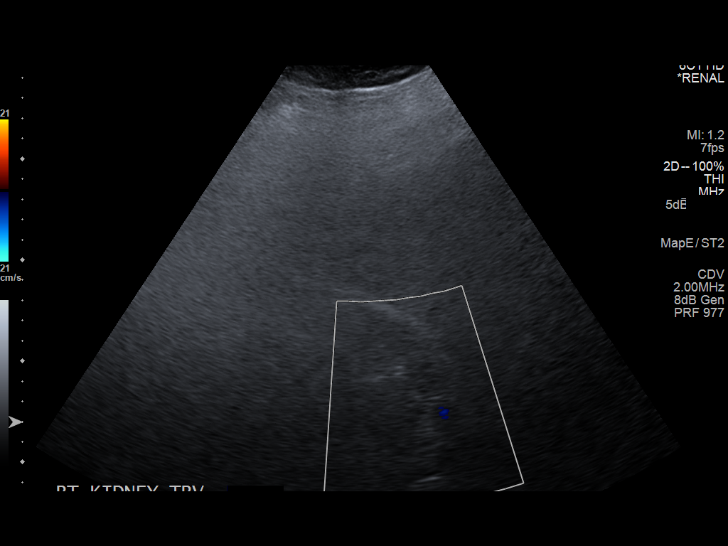
[im 15/27]
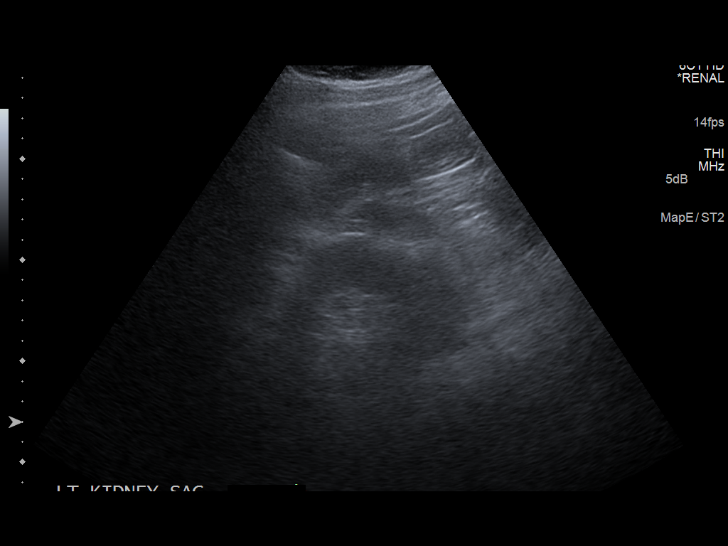
[im 17/27]
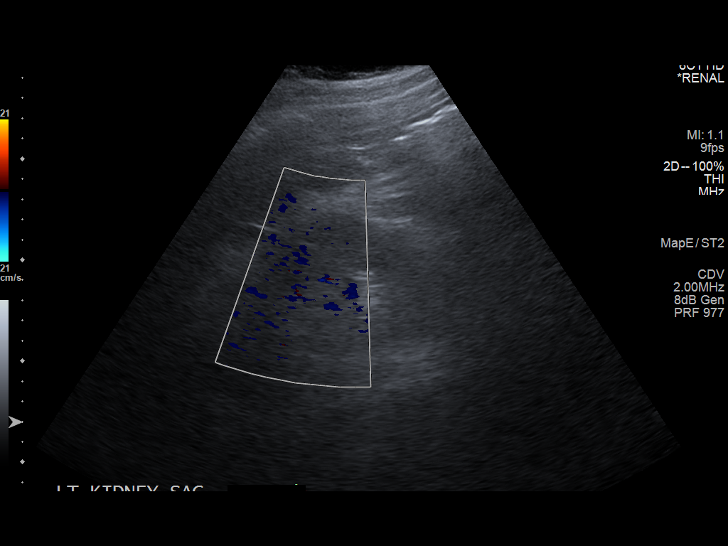
[im 18/27]
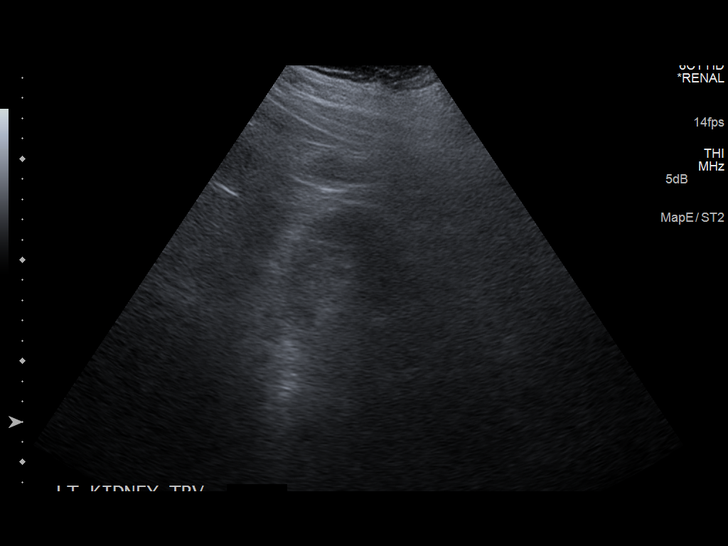
[im 20/27]
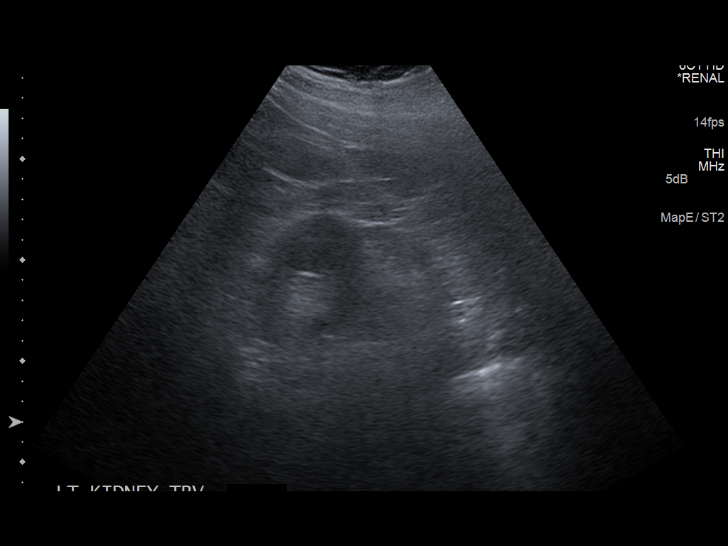
[im 22/27]
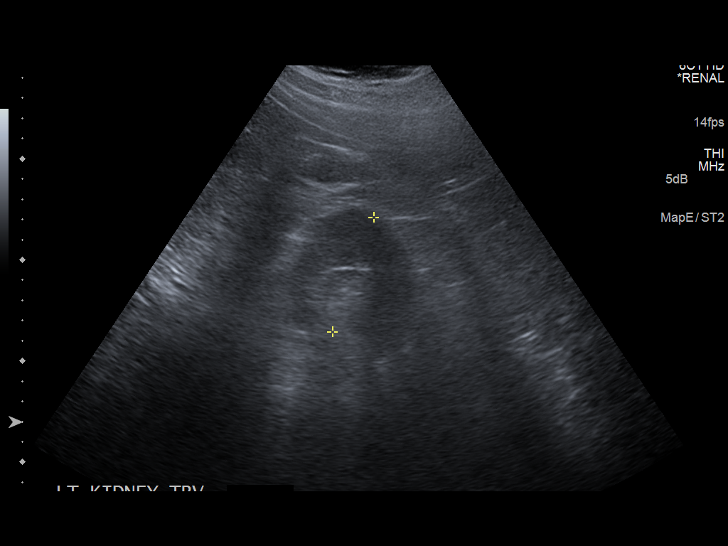
[im 24/27]
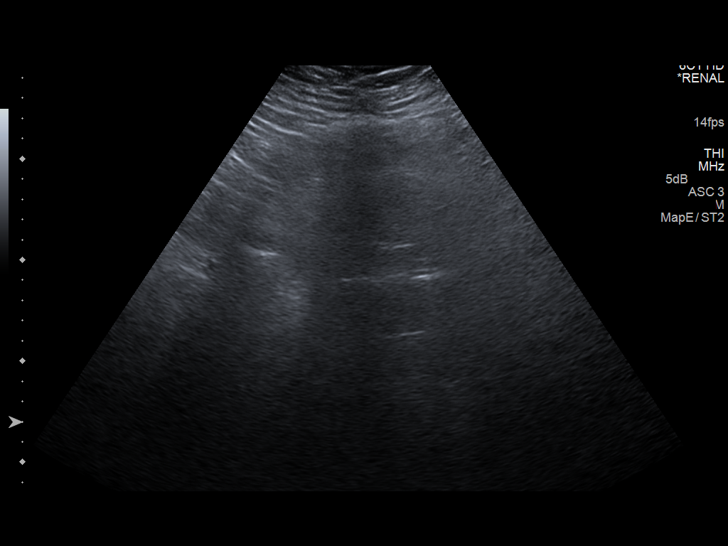
[im 27/27]
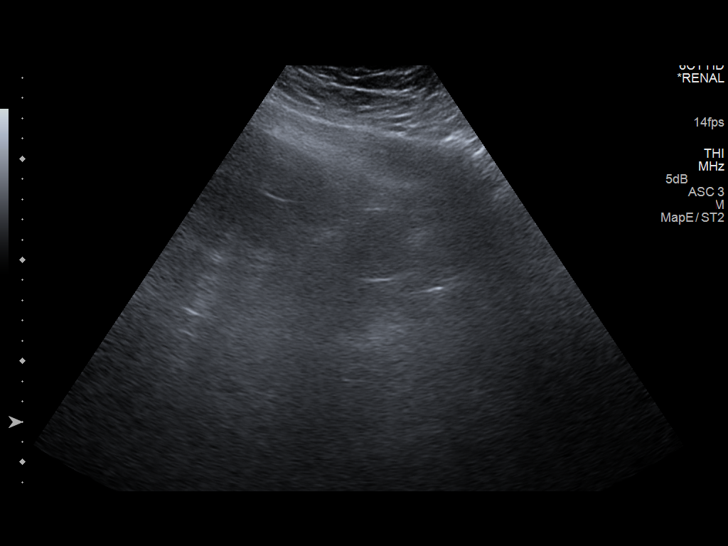

[14 of 25 positions shown; findings below may reference images not displayed]

FINDINGS: Right Kidney:

Length: 10.3 cm. Poorly visualized, though echogenicity is grossly
unremarkable. No mass or hydronephrosis visualized.

Left Kidney:

Length: 10.8 cm. Poorly visualized, though echogenicity is grossly
unremarkable. No mass or hydronephrosis visualized.

Bladder:

Not characterized on this study.
IMPRESSION: Significantly suboptimal study due to the patient's habitus and
limitations in positioning. The kidneys are grossly unremarkable. No
evidence of hydronephrosis.

## 2017-05-04 IMAGING — CR DG CHEST 1V PORT
2 series · 2 of 2 positions shown · non-contrast
Comparison: 11/19/2014

CLINICAL DATA: Acute renal failure

EXAM:
PORTABLE CHEST - 1 VIEW

[AP (1 of 2)]
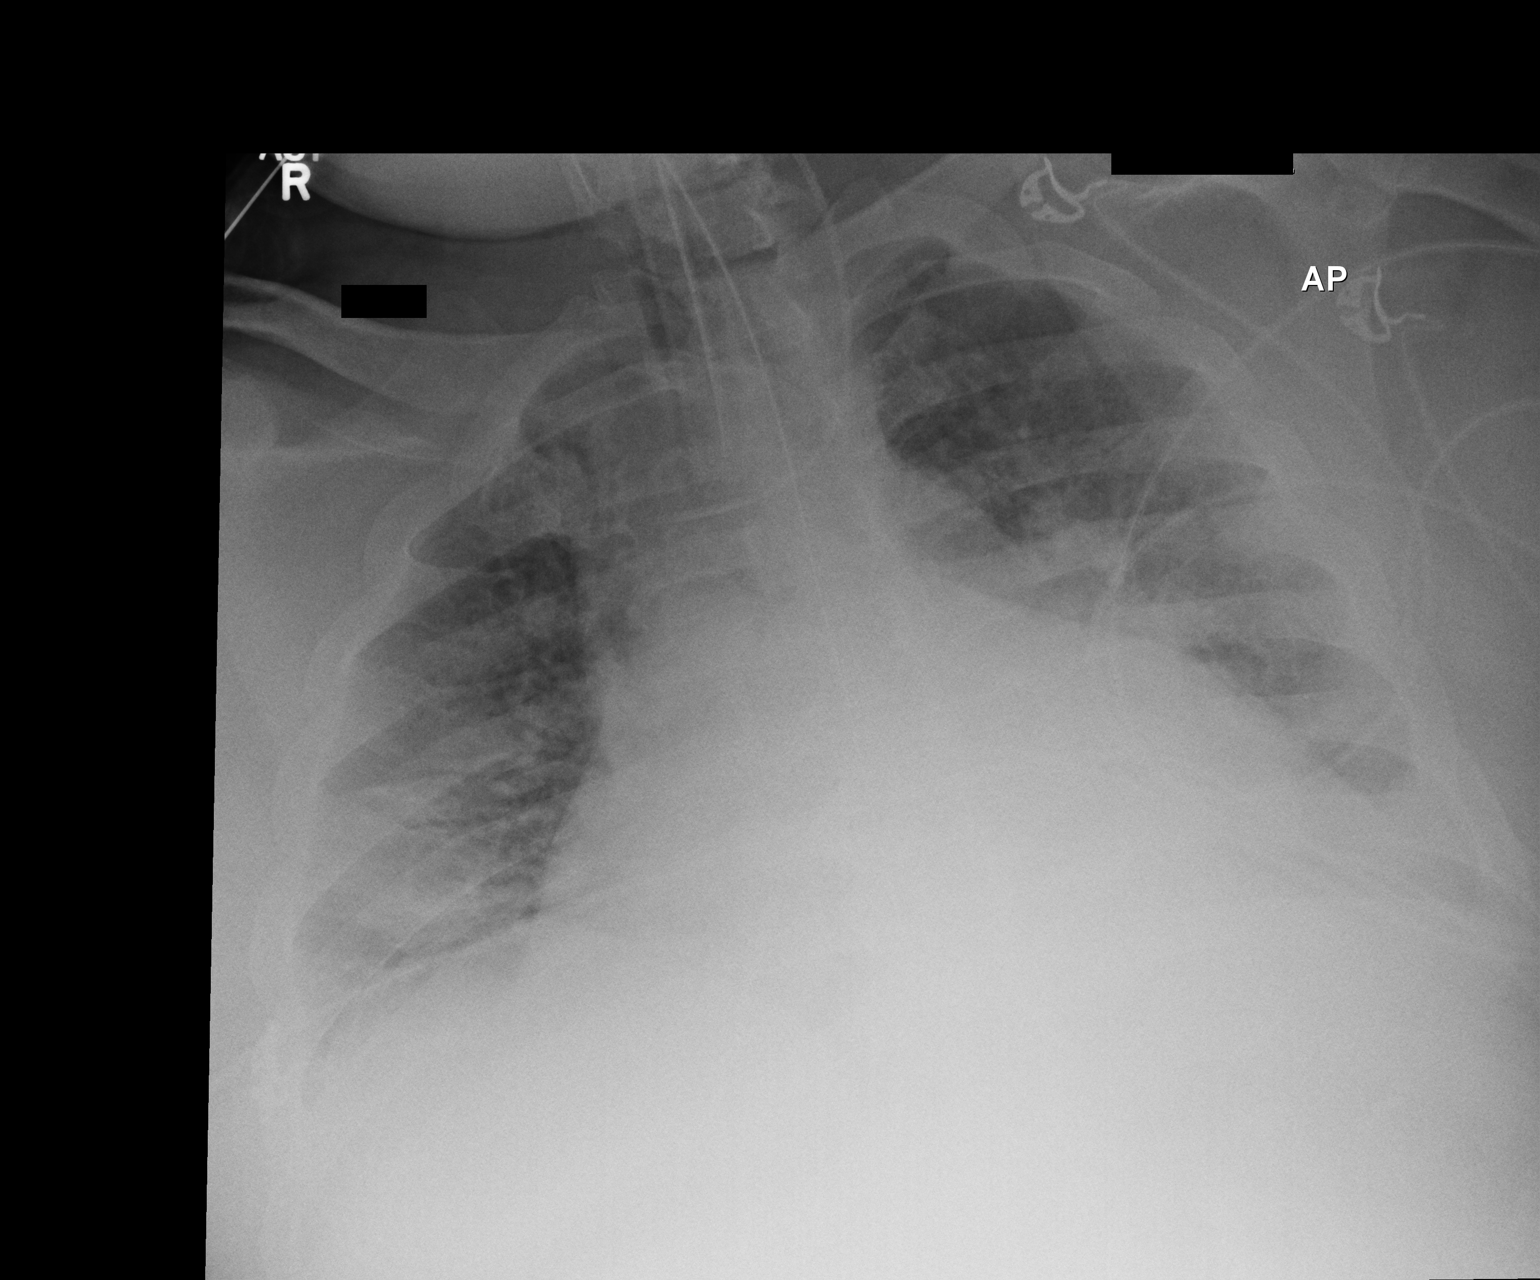

[AP (2 of 2)]
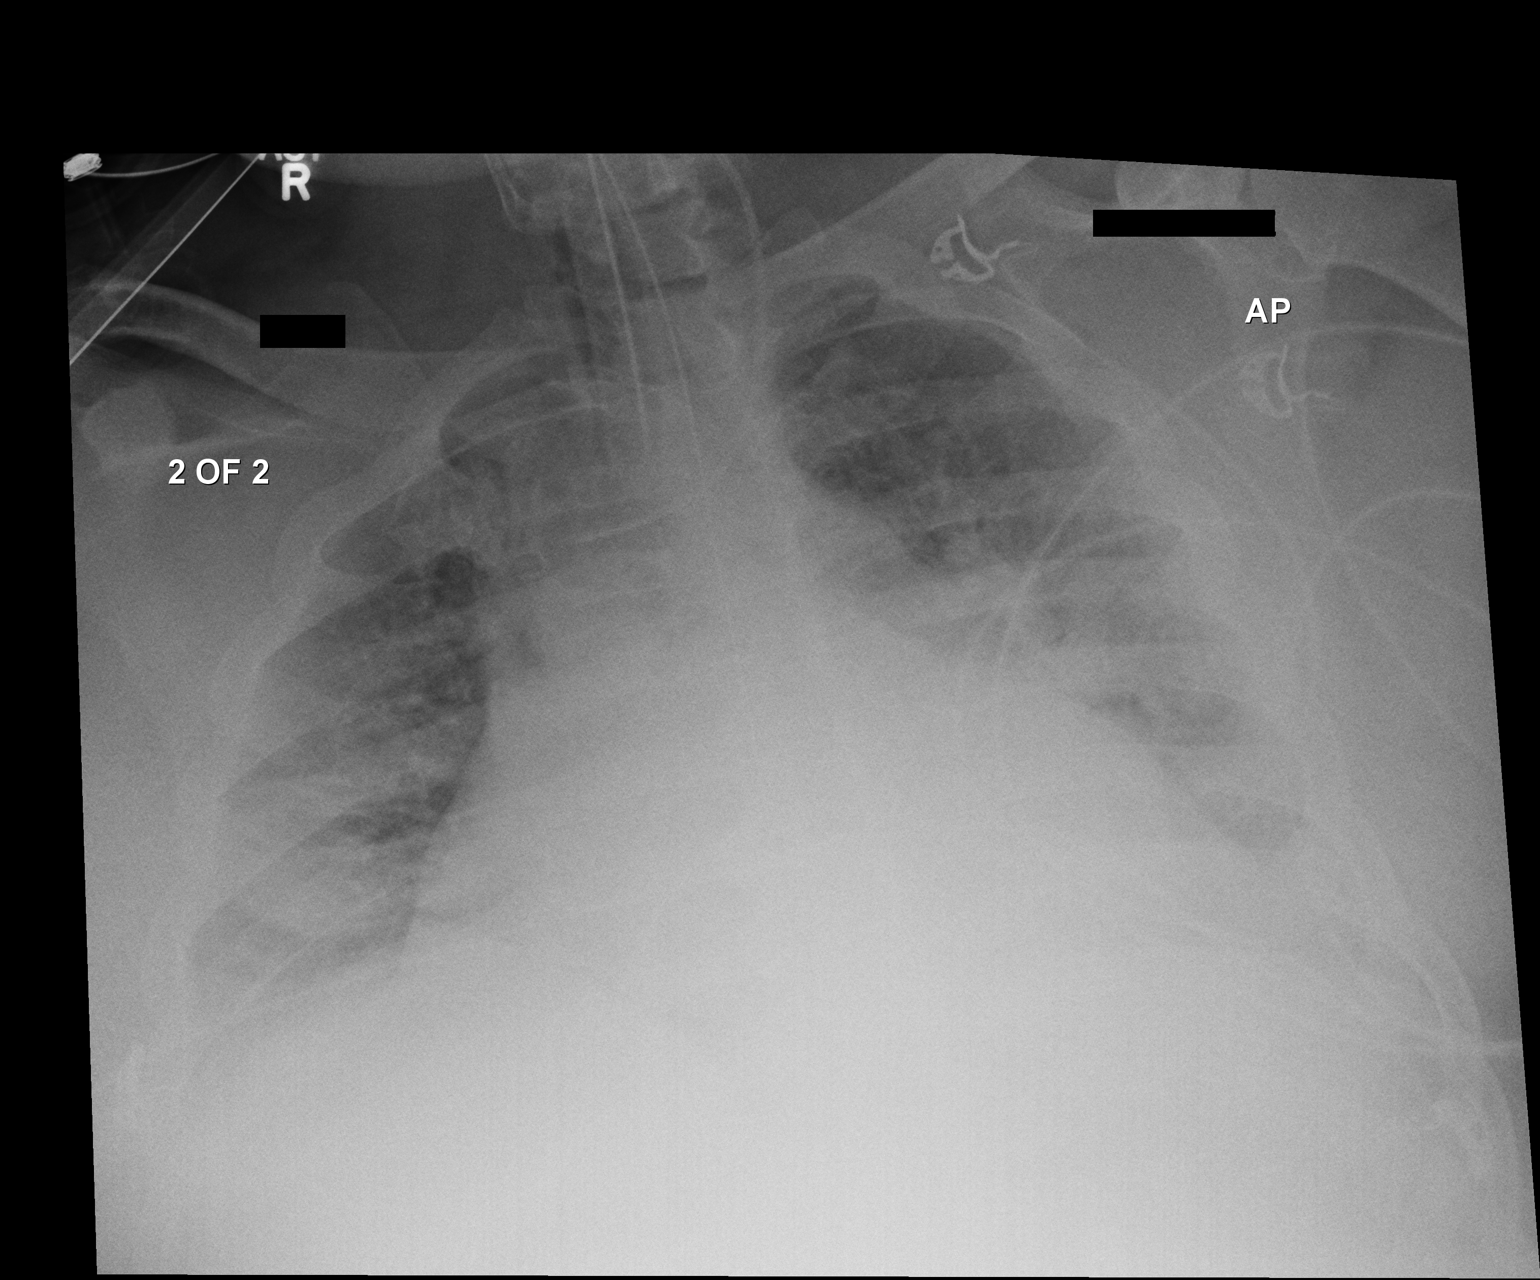

[2 of 2 positions shown; findings below may reference images not displayed]

FINDINGS: Cardiac shadow remains enlarged. Endotracheal tube and nasogastric
catheter as well as a left jugular line are again seen and stable.
Persistent atelectatic changes are noted in the right lung base
although improved aeration is noted. No other focal abnormality is
seen.
IMPRESSION: Improved but persistent atelectatic changes in the right base.

The remainder of the exam is stable.

## 2017-05-05 IMAGING — CR DG CHEST 1V PORT
1 series · 1 of 1 positions shown · non-contrast
Comparison: the previous day's study

CLINICAL DATA: Hx of ETT

EXAM:
PORTABLE CHEST - 1 VIEW

[AP]
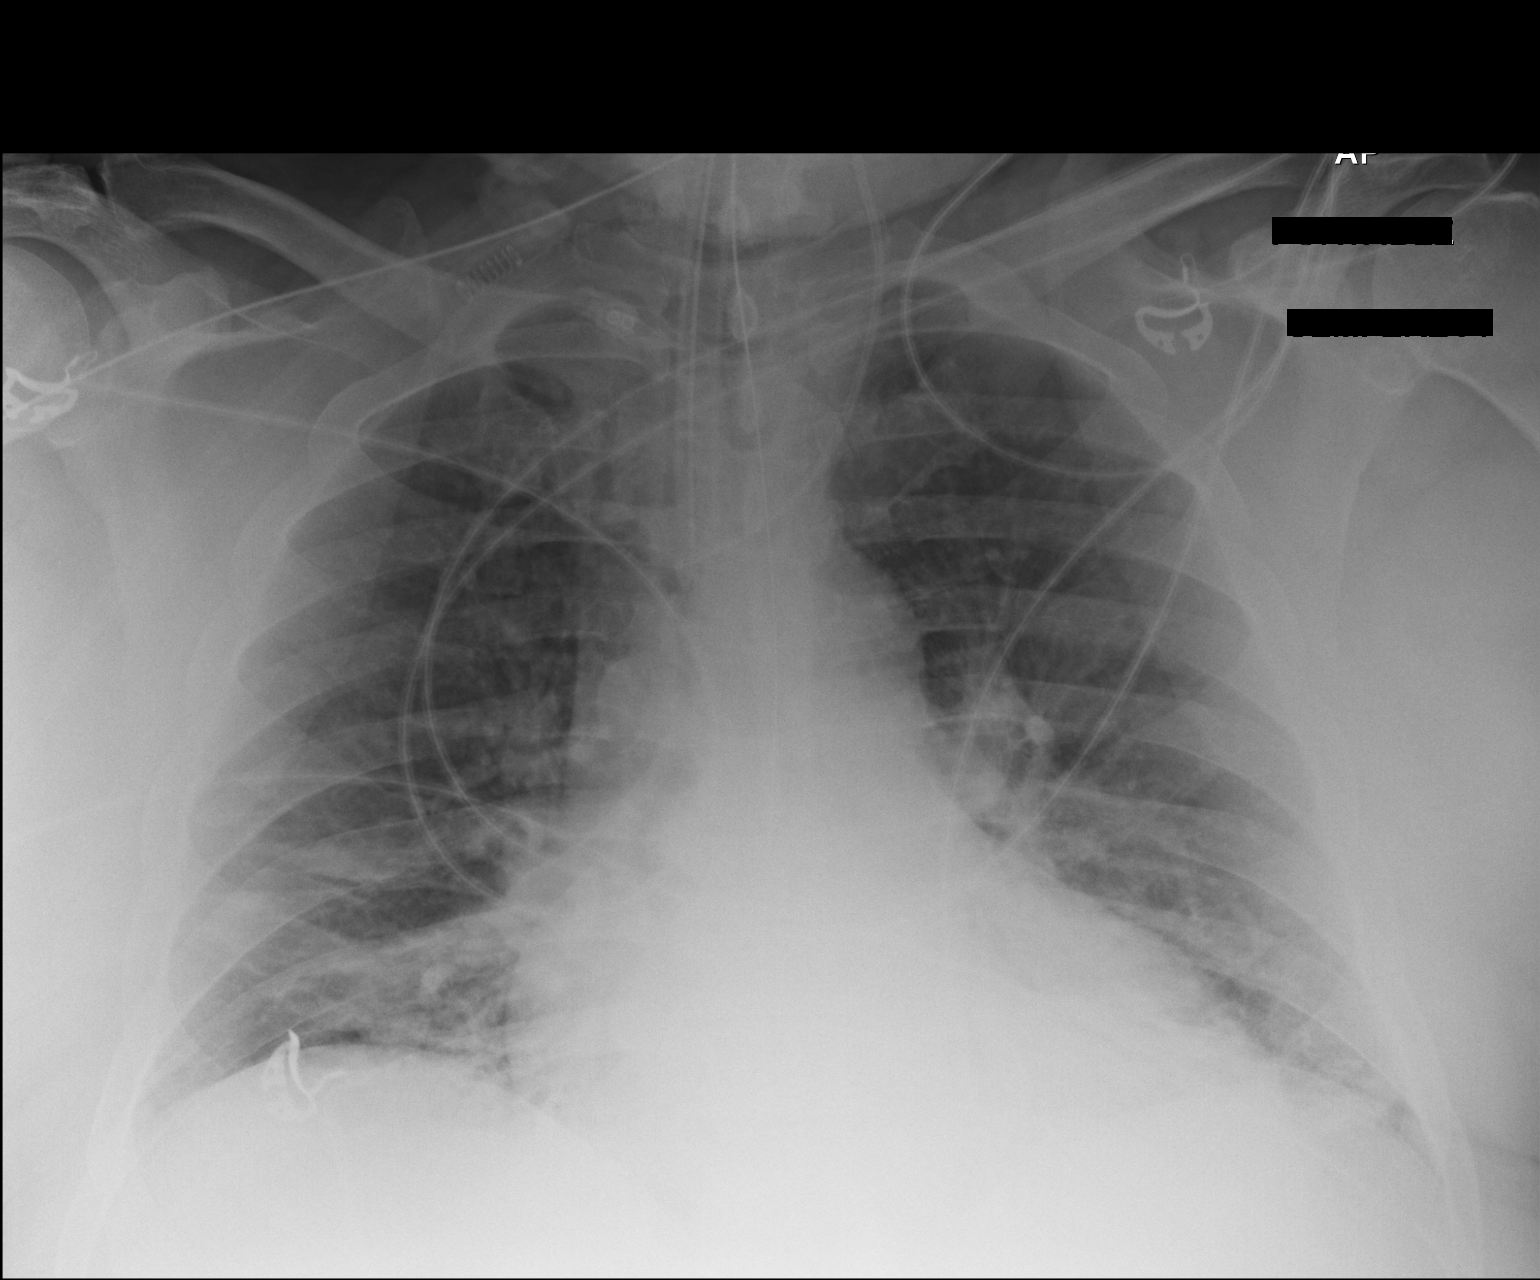

[1 of 1 positions shown; findings below may reference images not displayed]

FINDINGS: Endotracheal tube, nasogastric tube, and left IJ central line are
stable in position. Improved aeration with residual patchy perihilar
and bibasilar airspace opacities left greater than right. Heart size
upper limits normal for technique. No pneumothorax.
No effusion.
Visualized skeletal structures are unremarkable.
IMPRESSION: 1. Improved aeration with residual perihilar and bibasilar
atelectasis or infiltrates.
2.  Support hardware stable in position.

## 2021-04-13 DEATH — deceased
